# Patient Record
Sex: Male | Born: 1965 | Race: White | Hispanic: No | Marital: Married | Smoking: Former smoker
Health system: Southern US, Community
[De-identification: ages and names within clinical notes are randomized; demographics above are authoritative.]

## PROBLEM LIST (undated history)

## (undated) DIAGNOSIS — E785 Hyperlipidemia, unspecified: Secondary | ICD-10-CM

## (undated) DIAGNOSIS — Z86010 Personal history of colonic polyps: Secondary | ICD-10-CM

## (undated) DIAGNOSIS — I10 Essential (primary) hypertension: Secondary | ICD-10-CM

## (undated) DIAGNOSIS — K56609 Unspecified intestinal obstruction, unspecified as to partial versus complete obstruction: Secondary | ICD-10-CM

## (undated) DIAGNOSIS — K429 Umbilical hernia without obstruction or gangrene: Secondary | ICD-10-CM

## (undated) DIAGNOSIS — T7840XA Allergy, unspecified, initial encounter: Secondary | ICD-10-CM

## (undated) HISTORY — DX: Unspecified intestinal obstruction, unspecified as to partial versus complete obstruction: K56.609

## (undated) HISTORY — DX: Hyperlipidemia, unspecified: E78.5

## (undated) HISTORY — DX: Allergy, unspecified, initial encounter: T78.40XA

## (undated) HISTORY — DX: Essential (primary) hypertension: I10

## (undated) HISTORY — DX: Personal history of colonic polyps: Z86.010

## (undated) HISTORY — PX: APPENDECTOMY: SHX54

---

## 1999-03-09 ENCOUNTER — Emergency Department (HOSPITAL_COMMUNITY): Admission: EM | Admit: 1999-03-09 | Discharge: 1999-03-09 | Payer: Self-pay | Admitting: Emergency Medicine

## 2000-09-13 ENCOUNTER — Ambulatory Visit (HOSPITAL_COMMUNITY): Admission: RE | Admit: 2000-09-13 | Discharge: 2000-09-13 | Payer: Self-pay | Admitting: Orthopaedic Surgery

## 2000-09-13 ENCOUNTER — Encounter: Payer: Self-pay | Admitting: Orthopaedic Surgery

## 2007-01-25 ENCOUNTER — Ambulatory Visit: Payer: Self-pay | Admitting: Family Medicine

## 2007-02-03 ENCOUNTER — Ambulatory Visit: Payer: Self-pay | Admitting: Family Medicine

## 2007-02-03 LAB — CONVERTED CEMR LAB
Cholesterol: 257 mg/dL (ref 0–200)
Direct LDL: 174.9 mg/dL
Glucose, Bld: 143 mg/dL — ABNORMAL HIGH (ref 70–99)
HDL: 29 mg/dL — ABNORMAL LOW (ref >39.0)
TSH: 1.45 microintl units/mL (ref 0.35–5.50)
Total CHOL/HDL Ratio: 8.9
Triglycerides: 214 mg/dL (ref 0–149)
Uric Acid, Serum: 7.9 mg/dL — ABNORMAL HIGH (ref 2.4–7.0)
VLDL: 43 mg/dL — ABNORMAL HIGH (ref 0–40)

## 2007-02-09 ENCOUNTER — Ambulatory Visit: Payer: Self-pay | Admitting: Family Medicine

## 2007-02-13 ENCOUNTER — Telehealth (INDEPENDENT_AMBULATORY_CARE_PROVIDER_SITE_OTHER): Payer: Self-pay | Admitting: *Deleted

## 2007-02-16 ENCOUNTER — Encounter: Admission: RE | Admit: 2007-02-16 | Discharge: 2007-02-16 | Payer: Self-pay | Admitting: Orthopedic Surgery

## 2007-02-21 DIAGNOSIS — J309 Allergic rhinitis, unspecified: Secondary | ICD-10-CM | POA: Insufficient documentation

## 2007-03-03 ENCOUNTER — Ambulatory Visit: Payer: Self-pay | Admitting: Family Medicine

## 2007-03-05 LAB — CONVERTED CEMR LAB
ALT: 38 units/L (ref 0–53)
AST: 23 units/L (ref 0–37)
BUN: 18 mg/dL (ref 6–23)
Calcium: 9.1 mg/dL (ref 8.4–10.5)
Glucose, Bld: 190 mg/dL — ABNORMAL HIGH (ref 70–99)
Sodium: 140 meq/L (ref 135–145)

## 2007-03-06 ENCOUNTER — Telehealth (INDEPENDENT_AMBULATORY_CARE_PROVIDER_SITE_OTHER): Payer: Self-pay | Admitting: *Deleted

## 2007-03-07 ENCOUNTER — Telehealth (INDEPENDENT_AMBULATORY_CARE_PROVIDER_SITE_OTHER): Payer: Self-pay | Admitting: *Deleted

## 2007-03-10 ENCOUNTER — Telehealth (INDEPENDENT_AMBULATORY_CARE_PROVIDER_SITE_OTHER): Payer: Self-pay | Admitting: *Deleted

## 2007-03-24 ENCOUNTER — Encounter (INDEPENDENT_AMBULATORY_CARE_PROVIDER_SITE_OTHER): Payer: Self-pay | Admitting: Family Medicine

## 2007-03-24 ENCOUNTER — Encounter: Admission: RE | Admit: 2007-03-24 | Discharge: 2007-03-24 | Payer: Self-pay | Admitting: Family Medicine

## 2007-03-24 ENCOUNTER — Telehealth (INDEPENDENT_AMBULATORY_CARE_PROVIDER_SITE_OTHER): Payer: Self-pay | Admitting: *Deleted

## 2007-03-27 ENCOUNTER — Telehealth (INDEPENDENT_AMBULATORY_CARE_PROVIDER_SITE_OTHER): Payer: Self-pay | Admitting: *Deleted

## 2007-05-03 ENCOUNTER — Ambulatory Visit: Payer: Self-pay | Admitting: Family Medicine

## 2007-05-03 DIAGNOSIS — E1165 Type 2 diabetes mellitus with hyperglycemia: Secondary | ICD-10-CM

## 2007-05-03 DIAGNOSIS — E119 Type 2 diabetes mellitus without complications: Secondary | ICD-10-CM | POA: Insufficient documentation

## 2007-05-03 DIAGNOSIS — R030 Elevated blood-pressure reading, without diagnosis of hypertension: Secondary | ICD-10-CM

## 2007-05-03 DIAGNOSIS — E782 Mixed hyperlipidemia: Secondary | ICD-10-CM | POA: Insufficient documentation

## 2007-05-03 DIAGNOSIS — L259 Unspecified contact dermatitis, unspecified cause: Secondary | ICD-10-CM | POA: Insufficient documentation

## 2007-05-03 DIAGNOSIS — E1151 Type 2 diabetes mellitus with diabetic peripheral angiopathy without gangrene: Secondary | ICD-10-CM | POA: Insufficient documentation

## 2007-05-03 LAB — CONVERTED CEMR LAB
CO2: 27 meq/L (ref 19–32)
GFR calc Af Amer: 94 mL/min
Glucose, Bld: 87 mg/dL (ref 70–99)
Potassium: 3.8 meq/L (ref 3.5–5.1)

## 2007-05-04 ENCOUNTER — Telehealth (INDEPENDENT_AMBULATORY_CARE_PROVIDER_SITE_OTHER): Payer: Self-pay | Admitting: *Deleted

## 2007-07-27 ENCOUNTER — Ambulatory Visit: Payer: Self-pay | Admitting: Family Medicine

## 2007-07-30 LAB — CONVERTED CEMR LAB
ALT: 61 units/L — ABNORMAL HIGH (ref 0–53)
AST: 41 units/L — ABNORMAL HIGH (ref 0–37)
BUN: 12 mg/dL (ref 6–23)
Calcium: 9.6 mg/dL (ref 8.4–10.5)
Chloride: 104 meq/L (ref 96–112)
Creatinine, Ser: 1 mg/dL (ref 0.4–1.5)
Hgb A1c MFr Bld: 6.9 % — ABNORMAL HIGH (ref 4.6–6.0)
VLDL: 31 mg/dL (ref 0–40)

## 2007-08-03 ENCOUNTER — Ambulatory Visit: Payer: Self-pay | Admitting: Family Medicine

## 2007-08-30 ENCOUNTER — Ambulatory Visit: Payer: Self-pay | Admitting: Family Medicine

## 2007-08-30 ENCOUNTER — Encounter (INDEPENDENT_AMBULATORY_CARE_PROVIDER_SITE_OTHER): Payer: Self-pay | Admitting: *Deleted

## 2007-08-30 LAB — CONVERTED CEMR LAB
BUN: 12 mg/dL (ref 6–23)
Calcium: 9.9 mg/dL (ref 8.4–10.5)
GFR calc Af Amer: 94 mL/min
GFR calc non Af Amer: 78 mL/min
Glucose, Bld: 121 mg/dL — ABNORMAL HIGH (ref 70–99)

## 2007-09-26 ENCOUNTER — Telehealth (INDEPENDENT_AMBULATORY_CARE_PROVIDER_SITE_OTHER): Payer: Self-pay | Admitting: Family Medicine

## 2007-10-24 ENCOUNTER — Ambulatory Visit: Payer: Self-pay | Admitting: Family Medicine

## 2007-10-24 DIAGNOSIS — M549 Dorsalgia, unspecified: Secondary | ICD-10-CM | POA: Insufficient documentation

## 2007-10-24 DIAGNOSIS — R209 Unspecified disturbances of skin sensation: Secondary | ICD-10-CM

## 2007-10-25 ENCOUNTER — Ambulatory Visit: Payer: Self-pay | Admitting: Family Medicine

## 2007-10-26 ENCOUNTER — Telehealth (INDEPENDENT_AMBULATORY_CARE_PROVIDER_SITE_OTHER): Payer: Self-pay | Admitting: Family Medicine

## 2007-10-26 ENCOUNTER — Telehealth (INDEPENDENT_AMBULATORY_CARE_PROVIDER_SITE_OTHER): Payer: Self-pay | Admitting: *Deleted

## 2007-11-03 ENCOUNTER — Ambulatory Visit: Payer: Self-pay | Admitting: Family Medicine

## 2007-11-03 LAB — CONVERTED CEMR LAB
Microalb Creat Ratio: 13.5 mg/g (ref 0.0–30.0)
Microalb, Ur: 1.9 mg/dL (ref 0.0–1.9)

## 2007-11-04 ENCOUNTER — Encounter: Admission: RE | Admit: 2007-11-04 | Discharge: 2007-11-04 | Payer: Self-pay | Admitting: Family Medicine

## 2007-11-08 ENCOUNTER — Telehealth (INDEPENDENT_AMBULATORY_CARE_PROVIDER_SITE_OTHER): Payer: Self-pay | Admitting: *Deleted

## 2007-11-10 ENCOUNTER — Ambulatory Visit: Payer: Self-pay | Admitting: Family Medicine

## 2007-11-13 ENCOUNTER — Telehealth (INDEPENDENT_AMBULATORY_CARE_PROVIDER_SITE_OTHER): Payer: Self-pay | Admitting: *Deleted

## 2007-11-21 ENCOUNTER — Encounter (INDEPENDENT_AMBULATORY_CARE_PROVIDER_SITE_OTHER): Payer: Self-pay | Admitting: Family Medicine

## 2008-01-29 ENCOUNTER — Ambulatory Visit: Payer: Self-pay | Admitting: Family Medicine

## 2008-01-31 LAB — CONVERTED CEMR LAB
ALT: 50 units/L (ref 0–53)
BUN: 17 mg/dL (ref 6–23)
Cholesterol: 141 mg/dL (ref 0–200)
Creatinine, Ser: 1.1 mg/dL (ref 0.4–1.5)
GFR calc Af Amer: 94 mL/min
GFR calc non Af Amer: 77 mL/min
LDL Cholesterol: 82 mg/dL (ref 0–99)
Potassium: 4.3 meq/L (ref 3.5–5.1)
VLDL: 33 mg/dL (ref 0–40)

## 2008-02-01 ENCOUNTER — Encounter (INDEPENDENT_AMBULATORY_CARE_PROVIDER_SITE_OTHER): Payer: Self-pay | Admitting: *Deleted

## 2008-03-25 ENCOUNTER — Ambulatory Visit: Payer: Self-pay | Admitting: Family Medicine

## 2008-03-25 DIAGNOSIS — I10 Essential (primary) hypertension: Secondary | ICD-10-CM | POA: Insufficient documentation

## 2008-05-05 ENCOUNTER — Emergency Department (HOSPITAL_BASED_OUTPATIENT_CLINIC_OR_DEPARTMENT_OTHER): Admission: EM | Admit: 2008-05-05 | Discharge: 2008-05-05 | Payer: Self-pay | Admitting: Emergency Medicine

## 2008-05-09 ENCOUNTER — Ambulatory Visit: Payer: Self-pay | Admitting: Family Medicine

## 2008-05-19 ENCOUNTER — Encounter: Admission: RE | Admit: 2008-05-19 | Discharge: 2008-05-19 | Payer: Self-pay | Admitting: Sports Medicine

## 2008-05-19 LAB — CONVERTED CEMR LAB
Alkaline Phosphatase: 54 units/L (ref 39–117)
Bilirubin, Direct: 0.1 mg/dL (ref 0.0–0.3)
GFR calc Af Amer: 104 mL/min
GFR calc non Af Amer: 86 mL/min
LDL Cholesterol: 88 mg/dL (ref 0–99)
Potassium: 4 meq/L (ref 3.5–5.1)
Sodium: 137 meq/L (ref 135–145)
Total Bilirubin: 0.8 mg/dL (ref 0.3–1.2)
Total CHOL/HDL Ratio: 4.3
VLDL: 21 mg/dL (ref 0–40)

## 2008-05-20 ENCOUNTER — Telehealth: Payer: Self-pay | Admitting: Family Medicine

## 2008-05-23 ENCOUNTER — Encounter: Payer: Self-pay | Admitting: Family Medicine

## 2008-06-07 ENCOUNTER — Encounter: Admission: RE | Admit: 2008-06-07 | Discharge: 2008-06-07 | Payer: Self-pay | Admitting: Sports Medicine

## 2008-06-14 ENCOUNTER — Encounter: Payer: Self-pay | Admitting: Family Medicine

## 2008-06-20 ENCOUNTER — Ambulatory Visit: Payer: Self-pay | Admitting: Family Medicine

## 2008-06-20 DIAGNOSIS — IMO0001 Reserved for inherently not codable concepts without codable children: Secondary | ICD-10-CM

## 2008-06-24 ENCOUNTER — Telehealth (INDEPENDENT_AMBULATORY_CARE_PROVIDER_SITE_OTHER): Payer: Self-pay | Admitting: *Deleted

## 2008-06-24 ENCOUNTER — Ambulatory Visit: Payer: Self-pay | Admitting: Family Medicine

## 2008-06-24 ENCOUNTER — Encounter (INDEPENDENT_AMBULATORY_CARE_PROVIDER_SITE_OTHER): Payer: Self-pay | Admitting: *Deleted

## 2008-06-25 ENCOUNTER — Telehealth: Payer: Self-pay | Admitting: Family Medicine

## 2008-06-25 ENCOUNTER — Emergency Department (HOSPITAL_COMMUNITY): Admission: EM | Admit: 2008-06-25 | Discharge: 2008-06-25 | Payer: Self-pay | Admitting: Emergency Medicine

## 2008-06-25 ENCOUNTER — Telehealth (INDEPENDENT_AMBULATORY_CARE_PROVIDER_SITE_OTHER): Payer: Self-pay | Admitting: *Deleted

## 2008-06-26 ENCOUNTER — Encounter: Payer: Self-pay | Admitting: Family Medicine

## 2008-06-26 ENCOUNTER — Telehealth (INDEPENDENT_AMBULATORY_CARE_PROVIDER_SITE_OTHER): Payer: Self-pay | Admitting: *Deleted

## 2008-07-16 ENCOUNTER — Telehealth (INDEPENDENT_AMBULATORY_CARE_PROVIDER_SITE_OTHER): Payer: Self-pay | Admitting: *Deleted

## 2008-07-22 ENCOUNTER — Encounter: Payer: Self-pay | Admitting: Family Medicine

## 2008-07-24 ENCOUNTER — Encounter: Payer: Self-pay | Admitting: Family Medicine

## 2008-08-12 ENCOUNTER — Ambulatory Visit: Payer: Self-pay | Admitting: Family Medicine

## 2008-08-22 ENCOUNTER — Telehealth (INDEPENDENT_AMBULATORY_CARE_PROVIDER_SITE_OTHER): Payer: Self-pay | Admitting: *Deleted

## 2008-08-22 LAB — CONVERTED CEMR LAB
ALT: 47 units/L (ref 0–53)
Albumin: 4.2 g/dL (ref 3.5–5.2)
BUN: 13 mg/dL (ref 6–23)
Calcium: 9.6 mg/dL (ref 8.4–10.5)
Cholesterol: 236 mg/dL (ref 0–200)
Creatinine, Ser: 0.9 mg/dL (ref 0.4–1.5)
Direct LDL: 177.7 mg/dL
GFR calc non Af Amer: 97 mL/min
Hgb A1c MFr Bld: 6.9 % — ABNORMAL HIGH (ref 4.6–6.0)
Total Bilirubin: 1 mg/dL (ref 0.3–1.2)
Total CHOL/HDL Ratio: 7.6
Triglycerides: 130 mg/dL (ref 0–149)
VLDL: 26 mg/dL (ref 0–40)

## 2009-02-13 ENCOUNTER — Ambulatory Visit: Payer: Self-pay | Admitting: Family Medicine

## 2009-02-13 DIAGNOSIS — R109 Unspecified abdominal pain: Secondary | ICD-10-CM

## 2009-02-13 DIAGNOSIS — M109 Gout, unspecified: Secondary | ICD-10-CM | POA: Insufficient documentation

## 2009-02-19 ENCOUNTER — Ambulatory Visit: Payer: Self-pay | Admitting: Family Medicine

## 2009-02-27 LAB — CONVERTED CEMR LAB
ALT: 45 units/L (ref 0–53)
Albumin: 4.2 g/dL (ref 3.5–5.2)
Alkaline Phosphatase: 47 units/L (ref 39–117)
Chloride: 109 meq/L (ref 96–112)
Cholesterol: 163 mg/dL (ref 0–200)
Creatinine, Ser: 1 mg/dL (ref 0.4–1.5)
GFR calc non Af Amer: 85.8 mL/min (ref >60)
LDL Cholesterol: 109 mg/dL — ABNORMAL HIGH (ref 0–99)
Potassium: 4.1 meq/L (ref 3.5–5.1)
Total Protein: 7 g/dL (ref 6.0–8.3)
Triglycerides: 142 mg/dL (ref 0.0–149.0)

## 2009-02-28 ENCOUNTER — Telehealth (INDEPENDENT_AMBULATORY_CARE_PROVIDER_SITE_OTHER): Payer: Self-pay | Admitting: *Deleted

## 2009-04-17 ENCOUNTER — Telehealth: Payer: Self-pay | Admitting: Family Medicine

## 2009-06-02 ENCOUNTER — Ambulatory Visit: Payer: Self-pay | Admitting: Family Medicine

## 2009-06-04 ENCOUNTER — Encounter: Payer: Self-pay | Admitting: Family Medicine

## 2009-06-08 LAB — CONVERTED CEMR LAB
ALT: 53 units/L (ref 0–53)
AST: 44 units/L — ABNORMAL HIGH (ref 0–37)
Alkaline Phosphatase: 46 units/L (ref 39–117)
Bilirubin, Direct: 0 mg/dL (ref 0.0–0.3)
Chloride: 109 meq/L (ref 96–112)
Cholesterol: 140 mg/dL (ref 0–200)
GFR calc non Af Amer: 85.69 mL/min (ref >60)
LDL Cholesterol: 89 mg/dL (ref 0–99)
Microalb Creat Ratio: 4.6 mg/g (ref 0.0–30.0)
Potassium: 4.3 meq/L (ref 3.5–5.1)
Sodium: 140 meq/L (ref 135–145)
Total Bilirubin: 0.9 mg/dL (ref 0.3–1.2)
Total CHOL/HDL Ratio: 5
Uric Acid, Serum: 6.3 mg/dL (ref 4.0–7.8)
VLDL: 22.6 mg/dL (ref 0.0–40.0)

## 2009-06-16 ENCOUNTER — Telehealth: Payer: Self-pay | Admitting: Family Medicine

## 2009-12-22 ENCOUNTER — Encounter: Payer: Self-pay | Admitting: Family Medicine

## 2010-02-19 ENCOUNTER — Telehealth (INDEPENDENT_AMBULATORY_CARE_PROVIDER_SITE_OTHER): Payer: Self-pay | Admitting: *Deleted

## 2010-03-09 ENCOUNTER — Ambulatory Visit: Payer: Self-pay | Admitting: Family Medicine

## 2010-03-10 ENCOUNTER — Telehealth: Payer: Self-pay | Admitting: Family Medicine

## 2010-03-11 ENCOUNTER — Encounter: Payer: Self-pay | Admitting: Family Medicine

## 2010-10-27 ENCOUNTER — Encounter: Payer: Self-pay | Admitting: Family Medicine

## 2010-11-01 LAB — CONVERTED CEMR LAB
BUN: 12 mg/dL (ref 6–23)
Chloride: 107 meq/L (ref 96–112)
Cholesterol: 239 mg/dL — ABNORMAL HIGH (ref 0–200)
GFR calc non Af Amer: 88.45 mL/min (ref >60)
Hgb A1c MFr Bld: 7.4 % — ABNORMAL HIGH (ref 4.6–6.5)
Potassium: 4.4 meq/L (ref 3.5–5.1)
Total CHOL/HDL Ratio: 7
Triglycerides: 184 mg/dL — ABNORMAL HIGH (ref 0.0–149.0)
VLDL: 36.8 mg/dL (ref 0.0–40.0)

## 2010-11-03 NOTE — Letter (Signed)
Summary: Care Consideration Regarding HbA1C Monitoring/[facility]  Care Consideration Regarding HbA1C Monitoring/[facility]   Imported By: Lanelle Bal 03/24/2010 10:35:31  _____________________________________________________________________  External Attachment:    Type:   Image     Comment:   External Document

## 2010-11-03 NOTE — Progress Notes (Signed)
Summary:  lab 06.06.11  Phone Note Outgoing Call   Summary of Call: Please call pt and schedule labs, Per labs in August was supposed to follow up in 3 months:  272.4  250.00  hgba1c, bmp, hep, NMR-  Follow-up for Phone Call        lm am to call & schedule lab appt .Marland KitchenOkey Regal Kane  Feb 19, 2010 10:39 AM  lm am needs to make lab appt .Marland KitchenOkey Regal Kane  Feb 27, 2010 2:09 PM    Additional Follow-up for Phone Call Additional follow up Details #1::        lab appt scheduled [id number] Additional Follow-up by: Greg Kane,  Mar 03, 2010 2:41 PM

## 2010-11-03 NOTE — Letter (Signed)
Summary: Letter Regarding Adding a Statin/[facility]  Letter Regarding Adding a Statin/[facility]   Imported By: Lanelle Bal 01/02/2010 11:46:32  _____________________________________________________________________  External Attachment:    Type:   Image     Comment:   External Document

## 2010-11-03 NOTE — Progress Notes (Signed)
Summary: Lab Results   Phone Note Outgoing Call   Call placed by: Army Fossa CMA,  March 10, 2010 12:47 PM Reason for Call: Discuss lab or test results Summary of Call: Regarding lab results, LMTCB:  DM not controlled---- con't janumet---add actos 15 mg #30  1 by mouth once daily , 2 refills Cholesterol not controlled---  Ideally your LDL (bad cholesterol) should be <_70___, your HDL (good cholesterol) should be >_40__ and your triglycerides should be< 150.  Diet and exercise will increase HDL and decrease the LDL and triglycerides. Read Dr. Danice Goltz book--Eat Drink and Be Healthy.    Increase Crestor 20 mg  #45  1 1/2 tab by mouth  at bedtime,  2 refills.  We will recheck labs in __3_ months.  hep, lipid, bmp, hgba1c, microalbumin  250.00, 272.4     Signed by Loreen Freud DO on 03/09/2010 at 8:38 PM  Follow-up for Phone Call        Pt states that he has been taking the Crestor 20 mg every other day, please advise. Army Fossa CMA  March 11, 2010 1:59 PM   Additional Follow-up for Phone Call Additional follow up Details #1::        If he can take it once daily it may be all we have to do. Additional Follow-up by: Loreen Freud DO,  March 11, 2010 5:22 PM    Additional Follow-up for Phone Call Additional follow up Details #2::    Pt is aware.  New/Updated Medications: CRESTOR 20 MG TABS (ROSUVASTATIN CALCIUM) Take one tablet daily at bedtime. ACTOS 15 MG TABS (PIOGLITAZONE HCL) 1 by mouth daily. Prescriptions: ACTOS 15 MG TABS (PIOGLITAZONE HCL) 1 by mouth daily.  #30 x 2   Entered by:   Army Fossa CMA   Authorized by:   Loreen Freud DO   Signed by:   Army Fossa CMA on 03/11/2010   Method used:   Electronically to        [facility] [address] (retail)       [address]       [place], [place]  [address]       Ph: [phone] or [phone]       Fax: [phone]   RxID:   [id number]

## 2010-11-06 ENCOUNTER — Telehealth (INDEPENDENT_AMBULATORY_CARE_PROVIDER_SITE_OTHER): Payer: Self-pay | Admitting: *Deleted

## 2010-11-11 NOTE — Progress Notes (Signed)
Summary: Refill Request  Phone Note Refill Request Call back at [phone] Message from:  Pharmacy on November 06, 2010 9:16 AM  Refills Requested: Medication #1:  ACTOS 15 MG TABS 1 by mouth daily..   Dosage confirmed as above?Dosage Confirmed   Supply Requested: 30   Last Refilled: 09/29/2010  Medication #2:  JANUMET 50-1000 MG TABS 1 by mouth two times a day   Dosage confirmed as above?Dosage Confirmed   Supply Requested: 60   Last Refilled: 10/01/2010 [facility] [address].   Next Appointment Scheduled: none Initial call taken by: Harold Barban,  November 06, 2010 9:17 AM    New/Updated Medications: JANUMET 50-1000 MG TABS (SITAGLIPTIN-METFORMIN HCL) 1 by mouth two times a day**LABS ARE DUE NOW_CALL FOR AN APPT** ACTOS 15 MG TABS (PIOGLITAZONE HCL) 1 by mouth daily.* LABS ARE DUE NOW_CALL FOR AN APPT* Prescriptions: ACTOS 15 MG TABS (PIOGLITAZONE HCL) 1 by mouth daily.* LABS ARE DUE NOW_CALL FOR AN APPT*  #30 x 0   Entered by:   Almeta Monas CMA (AAMA)   Authorized by:   Loreen Freud DO   Signed by:   Almeta Monas CMA (AAMA) on 11/06/2010   Method used:   Faxed to ...       [facility] [address] (retail)       [address]       [place], [place]  [address]       Ph: [phone] or [phone]       Fax: [phone]   RxID:   [id number] JANUMET 50-1000 MG TABS (SITAGLIPTIN-METFORMIN HCL) 1 by mouth two times a day**LABS ARE DUE NOW_CALL FOR AN APPT**  #60 x 0   Entered by:   Almeta Monas CMA (AAMA)   Authorized by:   Loreen Freud DO   Signed by:   Almeta Monas CMA (AAMA) on 11/06/2010   Method used:   Electronically to        [facility] [address] (retail)       [address]       [place], [place]  [address]       Ph: [phone] or [phone]       Fax: [phone]   RxID:   [id number]

## 2010-11-11 NOTE — Progress Notes (Signed)
Summary: Refil  Phone Note Refill Request Message from:  Patient on November 06, 2010 11:36 AM  Refills Requested: Medication #1:  ONE TOUCH ULTRA TEST STRIPS checks blood sugar 2x day   Dosage confirmed as above?Dosage Confirmed   Supply Requested: 3 months [facility]   *pt lost last rx*  Next Appointment Scheduled: none Initial call taken by: Lavell Islam,  November 06, 2010 11:36 AM    New/Updated Medications: ONETOUCH ULTRA BLUE  STRP (GLUCOSE BLOOD) take BS twice a day Prescriptions: ONETOUCH ULTRA BLUE  STRP (GLUCOSE BLOOD) take BS twice a day  #100 x 11   Entered by:   Almeta Monas CMA (AAMA)   Authorized by:   Loreen Freud DO   Signed by:   Almeta Monas CMA (AAMA) on 11/06/2010   Method used:   Faxed to ...       [facility] [address] (retail)       [address]       [place], [place]  [address]       Ph: [phone] or [phone]       Fax: [phone]   RxID:   [id number]

## 2010-11-19 NOTE — Letter (Signed)
Summary: Care Consideration/[facility]  Care Consideration/[facility]   Imported By: Maryln Gottron 11/13/2010 12:43:51  _____________________________________________________________________  External Attachment:    Type:   Image     Comment:   External Document

## 2010-12-18 ENCOUNTER — Encounter (INDEPENDENT_AMBULATORY_CARE_PROVIDER_SITE_OTHER): Payer: Self-pay | Admitting: *Deleted

## 2010-12-22 NOTE — Letter (Signed)
Summary: Primary Care Appointment Letter  [facility]  [address] [place], [place] [address]   Phone: [phone]  Fax: [phone]    12/18/2010 MRN: [id number]     [facility] [address] [place], [place]  [address]     Dear Mr. [facility],   Your Primary Care Physician Loreen Freud DO has indicated that:    ___X____it is time to schedule an appointment to have fasting labs done.    _______you missed your appointment on______ and need to call and          reschedule.    _______you need to have lab work done.    _______you need to schedule an appointment discuss lab or test results.    _______you need to call to reschedule your appointment that is                       scheduled on _________.     Please call our office as soon as possible. Our phone number is [phone]. Please press option 1. Our office is open 8a-5p, Monday through Friday.     Thank you,    [facility]

## 2011-01-26 ENCOUNTER — Other Ambulatory Visit: Payer: Self-pay

## 2011-01-26 MED ORDER — PIOGLITAZONE HCL 15 MG PO TABS: 15.0000 mg | ORAL_TABLET | Freq: Every day | ORAL | Status: DC

## 2011-01-26 MED ORDER — SITAGLIPTIN PHOS-METFORMIN HCL 50-1000 MG PO TABS: 1.0000 | ORAL_TABLET | Freq: Two times a day (BID) | ORAL | Status: DC

## 2011-02-11 ENCOUNTER — Telehealth: Payer: Self-pay | Admitting: Family Medicine

## 2011-02-11 NOTE — Telephone Encounter (Signed)
Patient received letter dated 12/18/2010 stating he needed fasting labs--has appt for 5/16---what labs and orders does he need??  (Orders from 03/2010 show hep, lipid, bmp, hgba1c, microalbumin  250.00, 272.4)

## 2011-02-12 NOTE — Telephone Encounter (Signed)
Added info to 5/16 labs

## 2011-02-12 NOTE — Telephone Encounter (Signed)
This is what pt needs to be scheduled for labs form 03/2010

## 2011-02-16 ENCOUNTER — Other Ambulatory Visit: Payer: Self-pay | Admitting: *Deleted

## 2011-02-16 DIAGNOSIS — E119 Type 2 diabetes mellitus without complications: Secondary | ICD-10-CM

## 2011-02-16 DIAGNOSIS — E785 Hyperlipidemia, unspecified: Secondary | ICD-10-CM

## 2011-02-16 NOTE — Assessment & Plan Note (Signed)
[facility]                        [place] OFFICE NOTE   Greg Kane, Greg Kane                      MRN:          [id number]  DATE:03/03/2007                            DOB:          [birth year]    Greg Kane presents for followup. At the last visit, he was started on  Chantix. The patient reports that he has not smoked in 30 days. He has  not refilled the next month of Chantix and states he does not need it.  He feels that he is not going back to smoking ever again.   He is also here to review laboratory results.   OBJECTIVE:  No examination was performed today.   LABORATORY DATA:  Fasting glucose was 143 with a hemoglobin A1c of 8.  Cholesterol was 257, LDL cholesterol was 175, HDL was 29, triglycerides  were 214. Uric acid was 7.9, TSH was 1.45 which was within normal  limits.   IMPRESSION:  1. Type with diabetes newly diagnosed.  2. Hyperlipidemia with LDL of 175 with goal of less than 100.  3. Borderline uric acid level.   PLAN:  1. I had a long discussion regarding type 2 diabetes pathophysiology      and risks.  2. Will refer patient to diabetic nutritionist for teaching. They will      provide the Glucometer and testing supply.  3. Will check a basic metabolic profile, AST and ALT.  4. Will start the patient on Glucophage 500 mg b.i.d. after review of      labs.  5. Will also start the patient on Zocor 40 daily after review of AST      and ALT.  6. Will repeat a uric acid level. If it is still borderline will      discuss treatment at the next visit in 6 weeks.  7. Lifestyle changes were discussed and encouraged.  8. The patient is scheduled to followup with me in 6 weeks.     Leanne Chang, M.D.  Electronically Signed    LA/Med[facility]  DD: 03/03/2007  DT: 03/04/2007  Job #: [id number]

## 2011-02-17 ENCOUNTER — Other Ambulatory Visit: Payer: Self-pay | Admitting: Family Medicine

## 2011-02-17 ENCOUNTER — Other Ambulatory Visit (INDEPENDENT_AMBULATORY_CARE_PROVIDER_SITE_OTHER): Payer: BC Managed Care – PPO

## 2011-02-17 DIAGNOSIS — E119 Type 2 diabetes mellitus without complications: Secondary | ICD-10-CM

## 2011-02-17 DIAGNOSIS — E785 Hyperlipidemia, unspecified: Secondary | ICD-10-CM

## 2011-02-17 DIAGNOSIS — R809 Proteinuria, unspecified: Secondary | ICD-10-CM

## 2011-02-17 LAB — HEPATIC FUNCTION PANEL
ALT: 48 U/L (ref 0–53)
Albumin: 4 g/dL (ref 3.5–5.2)
Alkaline Phosphatase: 48 U/L (ref 39–117)
Bilirubin, Direct: 0.1 mg/dL (ref 0.0–0.3)
Total Protein: 6.5 g/dL (ref 6.0–8.3)

## 2011-02-17 LAB — BASIC METABOLIC PANEL
CO2: 27 mEq/L (ref 19–32)
Chloride: 105 mEq/L (ref 96–112)
Glucose, Bld: 165 mg/dL — ABNORMAL HIGH (ref 70–99)
Sodium: 139 mEq/L (ref 135–145)

## 2011-02-17 LAB — LIPID PANEL
HDL: 30.6 mg/dL — ABNORMAL LOW (ref >39.00)
Total CHOL/HDL Ratio: 4
Triglycerides: 92 mg/dL (ref 0.0–149.0)

## 2011-02-17 LAB — MICROALBUMIN / CREATININE URINE RATIO
Creatinine,U: 219.6 mg/dL
Microalb, Ur: 3.5 mg/dL — ABNORMAL HIGH (ref 0.0–1.9)

## 2011-02-18 ENCOUNTER — Other Ambulatory Visit: Payer: Self-pay | Admitting: Family Medicine

## 2011-02-19 ENCOUNTER — Telehealth: Payer: Self-pay | Admitting: *Deleted

## 2011-02-19 DIAGNOSIS — R809 Proteinuria, unspecified: Secondary | ICD-10-CM

## 2011-02-19 NOTE — Telephone Encounter (Addendum)
Discuss with patient, awaiting appt info  Message copied by Candie Echevaria on Fri Feb 19, 2011 12:13 PM ------      Message from: Loreen Freud      Created: Wed Feb 17, 2011  2:16 PM       Increased proteinuria---refer to nephrology

## 2011-03-16 ENCOUNTER — Encounter: Payer: Self-pay | Admitting: Family Medicine

## 2011-03-18 ENCOUNTER — Ambulatory Visit (INDEPENDENT_AMBULATORY_CARE_PROVIDER_SITE_OTHER): Payer: BC Managed Care – PPO | Admitting: Family Medicine

## 2011-03-18 ENCOUNTER — Encounter: Payer: Self-pay | Admitting: Family Medicine

## 2011-03-18 VITALS — BP 130/90 | HR 100 | Temp 98.4°F | Wt 295.0 lb

## 2011-03-18 DIAGNOSIS — E785 Hyperlipidemia, unspecified: Secondary | ICD-10-CM

## 2011-03-18 DIAGNOSIS — I10 Essential (primary) hypertension: Secondary | ICD-10-CM

## 2011-03-18 DIAGNOSIS — E119 Type 2 diabetes mellitus without complications: Secondary | ICD-10-CM

## 2011-03-18 MED ORDER — LOSARTAN POTASSIUM 50 MG PO TABS: 50.0000 mg | ORAL_TABLET | Freq: Every day | ORAL | Status: DC

## 2011-03-18 MED ORDER — LIRAGLUTIDE 18 MG/3ML ~~LOC~~ SOLN: SUBCUTANEOUS | Status: DC

## 2011-03-18 MED ORDER — METFORMIN HCL ER 500 MG PO TB24: ORAL_TABLET | ORAL | Status: DC

## 2011-03-18 NOTE — Progress Notes (Signed)
  Subjective:    Patient ID: Greg Kane, male    DOB: 05-02-1966, 45 y.o.   MRN: [id number]  HPI Pt here to review labs and go over Victoza.   No other complaints.  Pt stopped lisinopril because it made him feel funny.     Review of Systems As above    Objective:   Physical Exam  Constitutional: He appears well-developed and well-nourished.  Psychiatric: He has a normal mood and affect. His behavior is normal.          Assessment & Plan:

## 2011-03-18 NOTE — Patient Instructions (Signed)
Diabetes and Exercise Regular exercise is important and can help:   Control blood glucose (sugar).   Decrease blood pressure.   Control blood lipids (cholesterol and triglycerides).   Improve overall health.  BENEFITS FROM EXERCISE: Improved fitness.       Diabetes Meal Planning Guide The diabetes meal planning guide is a tool to help you plan your meals and snacks. It is important for people with diabetes to manage their blood sugar levels. Choosing the right foods and the right amounts throughout your day will help control your blood sugar. Eating right can even help you improve your blood pressure and reach or maintain a healthy weight. CARBOHYDRATE COUNTING MADE EASY When you eat carbohydrates, they turn to sugar (glucose). This raises your blood sugar level. Counting carbohydrates can help you control this level so you feel better. When you plan your meals by counting carbohydrates, you can have more flexibility in what you eat and balance your medicine with your food intake. Carbohydrate counting simply means adding up the total amount of carbohydrate grams (g) in your meals or snacks. Try to eat about the same amount at each meal. Foods with carbohydrates are listed below. Each portion below is 1 carbohydrate serving or 15 grams of carbohydrates. Ask your dietician how many grams of carbohydrates you should eat at each meal or snack. Grains and Starches 1 slice bread 1/2 English muffin or hotdog/hamburger bun 3/4 cup cold cereal (unsweetened) 1/3 cup cooked pasta or rice 1/2 cup starchy vegetables (corn, potatoes, peas, beans, winter squash) 1 tortilla (6 inches) 1/4 bagel 1 waffle or pancake (size of a CD) 1/2 cup cooked cereal 4 to 6 small crackers *Whole grain is recommended Fruit 1 cup fresh unsweetened berries, melon, papaya, pineapple 1 small fresh fruit 1/2 banana or mango 1/2 cup fruit juice (4 ounces unsweetened) 1/2 cup canned fruit in natural juice or water 2  tablespoons dried fruit 12 to 15 grapes or cherries Milk and Yogurt 1 cup fat-free or 1% milk 1 cup soy milk 6 ounces light yogurt with sugar-free sweetener 6 ounces low-fat soy yogurt 6 ounces plain yogurt Vegetables 1 cup raw or 1/2 cup cooked is counted as 0 carbohydrates or a "free" food. If you eat 3 or more servings at one meal, count them as 1 carbohydrate serving. Other Carbohydrates 3/4 ounces chips or pretzels 1/2 cup ice cream or frozen yogurt 1/4 cup sherbet or sorbet 2 inch square cake, no frosting 1 tablespoon honey, sugar, jam, jelly, or syrup 2 small cookies 3 squares of graham crackers 3 cups popcorn 6 crackers 1 cup broth-based soup Count 1 cup casserole or other mixed foods as 2 carbohydrate servings. Foods with less than 20 calories in a serving may be counted as 0 carbohydrates or a "free" food. You may want to purchase a book or computer software that lists the carbohydrate gram counts of different foods. In addition, the nutrition facts panel on the labels of the foods you eat are a good source of this information. The label will tell you how big the serving size is and the total number of carbohydrate grams you will be eating per serving. Divide this number by 15 to obtain the number of carbohydrate servings in a portion. Remember: 1 carbohydrate serving equals 15 grams of carbohydrate. SERVING SIZES Measuring foods and serving sizes helps you make sure you are getting the right amount of food. The list below tells how big or small some common serving sizes are. 1 ounce (  oz) of cheese.................................4 stacked dice.  2 to 3 oz cooked meat.................................Marland KitchenDeck of cards.  1 teaspoon (tsp)...........................................Marland KitchenTip of little finger.  1 tablespoon (tbs).......................................Marland KitchenMarland KitchenThumb.  2 tbs............................................................Marland KitchenGolf ball.    cup..........................................................Marland KitchenHalf of a fist.  1 cup...........................................................Marland KitchenA fist.  SAMPLE DIABETES MEAL PLAN Below is a sample meal plan that includes foods from the grain and starches, dairy, vegetable, fruit, and meat groups. A dietician can individualize a meal plan to fit your calorie needs and tell you the number of servings needed from each food group. However, controlling the total amount of carbohydrates in your meal or snack is more important than making sure you include all of the food groups at every meal. You may interchange carbohydrate containing foods (dairy, starches, and fruits). The meal plan below is an example of a 2000 calorie diet using carbohydrate counting. This meal plan has 17 carbohydrate servings (carb choices). Breakfast 1 cup oatmeal (2 carb choices) 3/4 cup light yogurt (1 carb choice) 1 cup blueberries (1 carb choice) 1/4 cup almonds  Snack 1 large apple (2 carb choices) 1 low-fat string cheese stick  Lunch Chicken breast salad: 1 cup spinach  1/4 cup chopped tomatoes  2 oz chicken breast, sliced  2 tbs low-fat [place] dressing  12 whole-wheat crackers (2 carb choices) 12 to 15 grapes (1 carb choice) 1 cup low-fat milk (1 carb choice)  Snack 1 cup carrots 1/2 cup hummus (1 carb choice)  Dinner 3 oz broiled salmon 1 cup brown rice (3 carb choices)  Snack 1 1/2 cups steamed broccoli (1 carb choice) drizzled with 1 tsp olive oil and lemon juice 1 cup light pudding (2 carb choices)  DIABETES MEAL PLANNING WORKSHEET Your dietician can use this worksheet to help you decide how many servings of foods and what types of foods are right for you.  Breakfast Food Group and Servings Carb Choices Grain/Starches _______________________________________ Dairy ______________________________________________ Vegetable _______________________________________ Fruit  _______________________________________________ Meat _______________________________________________ Fat _____________________________________________ Lunch Food Group and Servings Carb Choices Grain/Starches ________________________________________ Dairy _______________________________________________ Fruit ________________________________________________ Meat ________________________________________________ Fat _____________________________________________ Dinner Food Group and Servings Carb Choices Grain/Starches ________________________________________ Dairy _______________________________________________ Fruit ________________________________________________ Meat ________________________________________________ Fat _____________________________________________ Snacks Food Group and Servings Carb Choices Grain/Starches ________________________________________ Dairy _______________________________________________ Vegetable ________________________________________ Fruit ________________________________________________ Meat ________________________________________________ Fat _____________________________________________ Daily Totals Starches _________________________ Vegetable __________________________ Fruit ______________________________ Dairy ______________________________ Meat ______________________________ Fat ________________________________  Document Released: 06/17/2005 Document Re-Released: 03/10/2010  [employer] Patient Information 2011 [place], [place].  Improved flexibility.   Improved endurance.   Increased bone density.   Weight control.   Increased muscle strength.   Decreased body fat.   Improvement of the body's use of a hormone called insulin.   Increased insulin sensitivity.   Reduction of insulin needs.   Helps you feel better.   Reduces stress and tension.  People with diabetes who add exercise to their lifestyle gain additional benefits.     Weight loss.   Reduces appetite.   Improves body's use of blood glucose (sugar).   Decreases risk factors for heart disease:   Lowering of cholesterol and triglycerides.   Raising the level of good cholesterol (high-density lipoproteins [HDL]).   Lowering blood sugar.   Decreases blood pressure.  TYPE 1 DIABETES AND EXERCISE  Exercise will usually lower your blood glucose.   If blood glucose is greater than 240 mg/dl, check urine ketones. If ketones are present, do not exercise.   Location of the insulin injection sites may need to be adjusted with exercise. Avoid injecting insulin into areas of the body  that will be exercised. For example, avoid injecting insulin into:   The arms when playing tennis.   The legs when jogging. For more information, discuss this with your caregiver.   Keep a record of:   Food intake.   Type and amount of exercise.   Expected peak times of insulin action.   Blood glucose (sugar) levels.  Do this before, during and after exercise. Review your records with your caregiver(s). This will help you to develop guidelines for adjusting food intake and/or insulin amounts.  TYPE 2 DIABETES AND EXERCISE  Regular physical activity can help control blood glucose.   Exercise is important because it may:   Increase the body's sensitivity to insulin.   Improve blood glucose control.   Exercise reduces the risk of heart disease. It decreases serum cholesterol and triglycerides. It also lowers blood pressure.   Those who take insulin or oral hypoglycemic agents should watch for signs of hypoglycemia. These signs include dizziness, shaking, sweating, chills and confusion.   Body water is lost during exercise. It must be replaced. This will help to avoid loss of body fluids (dehydration) and/or heat stroke.  Be sure to talk to your caregiver before starting an exercise program to make sure it is safe for you. Remember, any activity is better than  none.  Document Released: 12/11/2003 Document Re-Released: 07/18/2009 [facility] Patient Information 2011 [place], [place].

## 2011-03-18 NOTE — Assessment & Plan Note (Signed)
See meds and orders Check glucose bid and rto 2-3 weeks or sooner prn---bring in readings

## 2011-03-18 NOTE — Assessment & Plan Note (Signed)
coazaar 50 mg daily rto 2-3 weeks

## 2011-03-18 NOTE — Assessment & Plan Note (Signed)
con't crestor Add flaxseed oil 4 g daily

## 2011-04-12 ENCOUNTER — Encounter: Payer: Self-pay | Admitting: Family Medicine

## 2011-04-12 ENCOUNTER — Ambulatory Visit (INDEPENDENT_AMBULATORY_CARE_PROVIDER_SITE_OTHER): Payer: BC Managed Care – PPO | Admitting: Family Medicine

## 2011-04-12 VITALS — BP 122/90 | HR 114 | Temp 97.0°F | Wt 295.0 lb

## 2011-04-12 DIAGNOSIS — I10 Essential (primary) hypertension: Secondary | ICD-10-CM

## 2011-04-12 DIAGNOSIS — E785 Hyperlipidemia, unspecified: Secondary | ICD-10-CM

## 2011-04-12 DIAGNOSIS — E119 Type 2 diabetes mellitus without complications: Secondary | ICD-10-CM

## 2011-04-12 DIAGNOSIS — M109 Gout, unspecified: Secondary | ICD-10-CM

## 2011-04-12 MED ORDER — ROSUVASTATIN CALCIUM 20 MG PO TABS: 20.0000 mg | ORAL_TABLET | Freq: Every day | ORAL | Status: DC

## 2011-04-12 MED ORDER — LIRAGLUTIDE 18 MG/3ML ~~LOC~~ SOLN: SUBCUTANEOUS | Status: DC

## 2011-04-12 MED ORDER — INSULIN PEN NEEDLE 32G X 5 MM MISC: 1.0000 "application " | Freq: Every day | Status: DC

## 2011-04-12 MED ORDER — LOSARTAN POTASSIUM 50 MG PO TABS: 50.0000 mg | ORAL_TABLET | Freq: Every day | ORAL | Status: DC

## 2011-04-12 MED ORDER — ALLOPURINOL 100 MG PO TABS: 100.0000 mg | ORAL_TABLET | Freq: Two times a day (BID) | ORAL | Status: DC

## 2011-04-12 MED ORDER — METFORMIN HCL ER 500 MG PO TB24: ORAL_TABLET | ORAL | Status: DC

## 2011-04-12 MED ORDER — GLUCOSE BLOOD VI STRP: ORAL_STRIP | Status: DC

## 2011-04-12 NOTE — Patient Instructions (Signed)
Diabetes Meal Planning Guide The diabetes meal planning guide is a tool to help you plan your meals and snacks. It is important for people with diabetes to manage their blood sugar levels. Choosing the right foods and the right amounts throughout your day will help control your blood sugar. Eating right can even help you improve your blood pressure and reach or maintain a healthy weight. CARBOHYDRATE COUNTING MADE EASY When you eat carbohydrates, they turn to sugar (glucose). This raises your blood sugar level. Counting carbohydrates can help you control this level so you feel better. When you plan your meals by counting carbohydrates, you can have more flexibility in what you eat and balance your medicine with your food intake. Carbohydrate counting simply means adding up the total amount of carbohydrate grams (g) in your meals or snacks. Try to eat about the same amount at each meal. Foods with carbohydrates are listed below. Each portion below is 1 carbohydrate serving or 15 grams of carbohydrates. Ask your dietician how many grams of carbohydrates you should eat at each meal or snack. Grains and Starches 1 slice bread 1/2 English muffin or hotdog/hamburger bun 3/4 cup cold cereal (unsweetened) 1/3 cup cooked pasta or rice 1/2 cup starchy vegetables (corn, potatoes, peas, beans, winter squash) 1 tortilla (6 inches) 1/4 bagel 1 waffle or pancake (size of a CD) 1/2 cup cooked cereal 4 to 6 small crackers *Whole grain is recommended Fruit 1 cup fresh unsweetened berries, melon, papaya, pineapple 1 small fresh fruit 1/2 banana or mango 1/2 cup fruit juice (4 ounces unsweetened) 1/2 cup canned fruit in natural juice or water 2 tablespoons dried fruit 12 to 15 grapes or cherries Milk and Yogurt 1 cup fat-free or 1% milk 1 cup soy milk 6 ounces light yogurt with sugar-free sweetener 6 ounces low-fat soy yogurt 6 ounces plain yogurt Vegetables 1 cup raw or 1/2 cup cooked is counted as 0  carbohydrates or a "free" food. If you eat 3 or more servings at one meal, count them as 1 carbohydrate serving. Other Carbohydrates 3/4 ounces chips or pretzels 1/2 cup ice cream or frozen yogurt 1/4 cup sherbet or sorbet 2 inch square cake, no frosting 1 tablespoon honey, sugar, jam, jelly, or syrup 2 small cookies 3 squares of graham crackers 3 cups popcorn 6 crackers 1 cup broth-based soup Count 1 cup casserole or other mixed foods as 2 carbohydrate servings. Foods with less than 20 calories in a serving may be counted as 0 carbohydrates or a "free" food. You may want to purchase a book or computer software that lists the carbohydrate gram counts of different foods. In addition, the nutrition facts panel on the labels of the foods you eat are a good source of this information. The label will tell you how big the serving size is and the total number of carbohydrate grams you will be eating per serving. Divide this number by 15 to obtain the number of carbohydrate servings in a portion. Remember: 1 carbohydrate serving equals 15 grams of carbohydrate. SERVING SIZES Measuring foods and serving sizes helps you make sure you are getting the right amount of food. The list below tells how big or small some common serving sizes are.  1 ounce (oz) of cheese.................................4 stacked dice.   2 to 3 oz cooked meat..................................Deck of cards.   1 teaspoon (tsp)............................................Tip of little finger.   1 tablespoon (tbs).........................................Thumb.   2 tbs.............................................................Golf ball.    cup...........................................................Half of a fist.   1 cup............................................................A fist.    SAMPLE DIABETES MEAL PLAN Below is a sample meal plan that includes foods from the grain and starches, dairy, vegetable, fruit, and  meat groups. A dietician can individualize a meal plan to fit your calorie needs and tell you the number of servings needed from each food group. However, controlling the total amount of carbohydrates in your meal or snack is more important than making sure you include all of the food groups at every meal. You may interchange carbohydrate containing foods (dairy, starches, and fruits). The meal plan below is an example of a 2000 calorie diet using carbohydrate counting. This meal plan has 17 carbohydrate servings (carb choices). Breakfast 1 cup oatmeal (2 carb choices) 3/4 cup light yogurt (1 carb choice) 1 cup blueberries (1 carb choice) 1/4 cup almonds  Snack 1 large apple (2 carb choices) 1 low-fat string cheese stick  Lunch Chicken breast salad:  1 cup spinach   1/4 cup chopped tomatoes   2 oz chicken breast, sliced   2 tbs low-fat Italian dressing  12 whole-wheat crackers (2 carb choices) 12 to 15 grapes (1 carb choice) 1 cup low-fat milk (1 carb choice)  Snack 1 cup carrots 1/2 cup hummus (1 carb choice)  Dinner 3 oz broiled salmon 1 cup brown rice (3 carb choices)  Snack 1 1/2 cups steamed broccoli (1 carb choice) drizzled with 1 tsp olive oil and lemon juice 1 cup light pudding (2 carb choices)  DIABETES MEAL PLANNING WORKSHEET Your dietician can use this worksheet to help you decide how many servings of foods and what types of foods are right for you.  Breakfast Food Group and Servings Carb Choices Grain/Starches _______________________________________ Dairy ______________________________________________ Vegetable _______________________________________ Fruit _______________________________________________ Meat _______________________________________________ Fat _____________________________________________ Lunch Food Group and Servings Carb Choices Grain/Starches ________________________________________ Dairy _______________________________________________ Fruit  ________________________________________________ Meat ________________________________________________ Fat _____________________________________________ Dinner Food Group and Servings Carb Choices Grain/Starches ________________________________________ Dairy _______________________________________________ Fruit ________________________________________________ Meat ________________________________________________ Fat _____________________________________________ Snacks Food Group and Servings Carb Choices Grain/Starches ________________________________________ Dairy _______________________________________________ Vegetable ________________________________________ Fruit ________________________________________________ Meat ________________________________________________ Fat _____________________________________________ Daily Totals Starches _________________________ Vegetable __________________________ Fruit ______________________________ Dairy ______________________________ Meat ______________________________ Fat ________________________________  Document Released: 06/17/2005 Document Re-Released: 03/10/2010 [employer] Patient Information 2011 [employer]. 

## 2011-04-12 NOTE — Progress Notes (Signed)
Addended by: Arnette Norris on: 04/12/2011 01:11 PM   Modules accepted: Orders

## 2011-04-12 NOTE — Assessment & Plan Note (Signed)
Pt ran out of victoza on the 2nd con't meds Recheck labs 4-6 weeks

## 2011-04-12 NOTE — Assessment & Plan Note (Signed)
Better--diastolic slightly high con't diet and exercise  Recheck 3 months

## 2011-04-12 NOTE — Assessment & Plan Note (Signed)
Cont' crestor Check labs 4-6 weeks

## 2011-04-12 NOTE — Progress Notes (Signed)
  Subjective:    Patient ID: REVERE MAAHS, male    DOB: 1966/01/29, 45 y.o.   MRN: [id number]  HPI  HYPERTENSION Disease Monitoring Blood pressure range-normal Chest pain- no      Dyspnea- no Medications Compliance- good Lightheadedness- no   Edema- no   DIABETES Disease Monitoring Blood Sugar ranges-140-150 Polyuria- no New Visual problems- no Medications Compliance- good Hypoglycemic symptoms- no   HYPERLIPIDEMIA Disease Monitoring See symptoms for Hypertension Medications Compliance- good RUQ pain- no  Muscle aches- no  ROS See HPI above   PMH Smoking Status noted   Review of Systems    see above Objective:   Physical Exam  Constitutional: He is oriented to person, place, and time. He appears well-developed and well-nourished.  Cardiovascular: Normal rate, regular rhythm and normal heart sounds.   No murmur heard. Pulmonary/Chest: Effort normal and breath sounds normal. No respiratory distress. He has no wheezes. He has no rales. He exhibits no tenderness.  Musculoskeletal: Normal range of motion. He exhibits no edema and no tenderness.       Sensory exam of the foot is normal, tested with the monofilament. Good pulses, no lesions or ulcers, good peripheral pulses.   Neurological: He is alert and oriented to person, place, and time. No cranial nerve deficit.  Skin: Skin is warm and dry.  Psychiatric: He has a normal mood and affect. His behavior is normal. Judgment and thought content normal.          Assessment & Plan:

## 2011-06-14 ENCOUNTER — Encounter: Payer: Self-pay | Admitting: Family Medicine

## 2011-06-14 ENCOUNTER — Ambulatory Visit (INDEPENDENT_AMBULATORY_CARE_PROVIDER_SITE_OTHER): Payer: BC Managed Care – PPO | Admitting: Family Medicine

## 2011-06-14 DIAGNOSIS — E785 Hyperlipidemia, unspecified: Secondary | ICD-10-CM

## 2011-06-14 DIAGNOSIS — E119 Type 2 diabetes mellitus without complications: Secondary | ICD-10-CM

## 2011-06-14 DIAGNOSIS — M109 Gout, unspecified: Secondary | ICD-10-CM

## 2011-06-14 DIAGNOSIS — R809 Proteinuria, unspecified: Secondary | ICD-10-CM

## 2011-06-14 DIAGNOSIS — I10 Essential (primary) hypertension: Secondary | ICD-10-CM

## 2011-06-14 LAB — BASIC METABOLIC PANEL
BUN: 15 mg/dL (ref 6–23)
Creatinine, Ser: 1.1 mg/dL (ref 0.4–1.5)
GFR: 77.71 mL/min (ref >60.00)

## 2011-06-14 LAB — CBC WITH DIFFERENTIAL/PLATELET
Basophils Relative: 0.4 % (ref 0.0–3.0)
Eosinophils Relative: 2 % (ref 0.0–5.0)
Lymphocytes Relative: 26.9 % (ref 12.0–46.0)
Monocytes Relative: 6.6 % (ref 3.0–12.0)
Neutrophils Relative %: 64.1 % (ref 43.0–77.0)
Platelets: 202 10*3/uL (ref 150.0–400.0)
RBC: 5.23 Mil/uL (ref 4.22–5.81)
WBC: 8.9 10*3/uL (ref 4.5–10.5)

## 2011-06-14 LAB — LIPID PANEL
Cholesterol: 176 mg/dL (ref 0–200)
LDL Cholesterol: 106 mg/dL — ABNORMAL HIGH (ref 0–99)
Total CHOL/HDL Ratio: 5
VLDL: 34.6 mg/dL (ref 0.0–40.0)

## 2011-06-14 LAB — HEPATIC FUNCTION PANEL
AST: 30 U/L (ref 0–37)
Alkaline Phosphatase: 57 U/L (ref 39–117)
Bilirubin, Direct: 0.1 mg/dL (ref 0.0–0.3)
Total Bilirubin: 0.6 mg/dL (ref 0.3–1.2)

## 2011-06-14 LAB — HEMOGLOBIN A1C: Hgb A1c MFr Bld: 7.4 % — ABNORMAL HIGH (ref 4.6–6.5)

## 2011-06-14 LAB — MICROALBUMIN / CREATININE URINE RATIO
Creatinine,U: 86.2 mg/dL
Microalb, Ur: 0.3 mg/dL (ref 0.0–1.9)

## 2011-06-14 LAB — URIC ACID: Uric Acid, Serum: 5 mg/dL (ref 4.0–7.8)

## 2011-06-14 MED ORDER — ROSUVASTATIN CALCIUM 20 MG PO TABS: 20.0000 mg | ORAL_TABLET | Freq: Every day | ORAL | Status: DC

## 2011-06-14 NOTE — Progress Notes (Signed)
  Subjective:    Patient ID: CLEOPHAS YOAK, male    DOB: 1966-02-06, 45 y.o.   MRN: [id number]  HPIHYPERTENSION Disease Monitoring Blood pressure range-not checking at home Chest pain- no      Dyspnea- no Medications Compliance- good Lightheadedness- no   Edema- no   DIABETES Disease Monitoring Blood Sugar ranges-120-134  Polyuria- no New Visual problems- no Medications Compliance- good Hypoglycemic symptoms- no   HYPERLIPIDEMIA Disease Monitoring See symptoms for Hypertension Medications Compliance- good RUQ pain- no  Muscle aches- no  ROS See HPI above   PMH Smoking Status noted      Review of Systems As above    Objective:   Physical Exam  Constitutional: He is oriented to person, place, and time. He appears well-developed and well-nourished.  Cardiovascular: Normal rate, regular rhythm and normal heart sounds.   No murmur heard. Pulmonary/Chest: Effort normal and breath sounds normal. No respiratory distress. He has no wheezes. He has no rales.  Musculoskeletal: He exhibits no edema and no tenderness.  Neurological: He is alert and oriented to person, place, and time.  Psychiatric: He has a normal mood and affect. His behavior is normal. Judgment and thought content normal.          Assessment & Plan:

## 2011-06-14 NOTE — Assessment & Plan Note (Signed)
Improved con't meds

## 2011-06-14 NOTE — Patient Instructions (Signed)
Diabetes and Your Kidney The function of normal kidneys is to filter and clean blood. Kidneys also rid the body's waste products and extra fluid. When the kidneys are healthy, blood is brought into the kidney and filtered, keeping proteins and needed chemicals in the blood stream. Waste and extra fluid is removed, and the cleaned blood is returned to the blood stream.  HOW DOES KIDNEY DISEASE (NEPHROPATHY) DEVELOP Kidney disease (nephropathy) develops slowly and may be caused by years of high blood glucose (sugar) and high blood pressure that damage the kidney's filters. When the kidneys are damaged, protein leaks into the urine. Damaged kidneys are not able to clean out waste and extra fluid. Waste and fluid build up in your blood instead of leaving the body in the urine. As damage progresses, more and more protein leaks from the kidneys and waste products build up in the blood. The damage progresses until the kidneys fail.  CAUSES  High blood pressure.   High blood glucose (sugar).   Family history.   Aging.   Obstruction problems.   Some drugs or medications.  SYMPTOMS Symptoms will not be seen or felt for many years. You will not feel sick when your kidneys do only half the job of healthy kidneys. You might not notice any signs of kidney failure until your kidneys have lost much of their ability to function. An early sign of damage is when small amounts of protein (albumin) leak into the urine. However, the only way to know about the leakage is to have your urine tested, but without physical symptoms, a urine test is often not performed.  SCREENING  Annual urine tests should be done to screen for trace amounts of protein in the urine (microalbuminuria).   A periodic, 24 hour collection of urine to measure creatinine and protein should be done as an estimate of kidney function.   Having a blood test called, serum creatinine, performed once a year to estimate how your kidneys are  filtering.  Some of these tests would include:  An ultrasound of your kidney.   Obtaining a small piece of the kidney to look at under a microscope (kidney biopsy).  PREVENTION Controlling high blood pressure (hypertension) and high blood glucose (hyperglycemia) is critical. A blood pressure goal to maintain is below 130/80. Get early treatment for urinary tract infections and follow up regularly with your caregiver. If your disease progresses to end stage kidney failure, you will need dialysis or a transplant. Dialysis can be done in two ways:   Hemodialysis - your blood flows from a tube in your arm through a machine. The machine filters waste and extra fluid. The cleaned blood flows back into your arm.   Peritoneal dialysis - your belly is filled with a special fluid. The fluid collects waste products and extra fluid from your blood. The fluid is then drained from your belly and discarded.  SEEK MEDICAL CARE IF:  You are having problems keeping your blood glucose in the goal range.   You have swelling of hands or feet.   You develop weakness.   You have muscles spasms.  Document Released: 10/10/2007 Document Re-Released: 07/17/2009 [employer] Patient Information 2011 [employer]. 

## 2011-06-14 NOTE — Assessment & Plan Note (Signed)
Pt saw nephro Recheck today

## 2011-06-14 NOTE — Assessment & Plan Note (Signed)
Check uric acid Pt is not taking allopurinal No gout symptoms

## 2011-06-14 NOTE — Assessment & Plan Note (Signed)
Check labs con't meds 

## 2011-06-14 NOTE — Assessment & Plan Note (Signed)
Con't meds Check labs stable

## 2011-07-05 ENCOUNTER — Telehealth: Payer: Self-pay

## 2011-07-05 MED ORDER — METFORMIN HCL ER 500 MG PO TB24: 1500.0000 mg | ORAL_TABLET | Freq: Every day | ORAL | Status: DC

## 2011-07-05 NOTE — Telephone Encounter (Signed)
Message copied by Arnette Norris on Mon Jul 05, 2011  5:50 PM ------      Message from: Lelon Perla      Created: Sun Jun 20, 2011  7:01 PM       Increase glucophage xr 750 mg  #60  2 po qd ,  2 refills      Cholesterol--- LDL goal < 70,  HDL >40,  TG < 150.  Diet and exercise will increase HDL and decrease LDL and TG.  Fish,  Fish Oil, Flaxseed oil will also help increase the HDL and decrease Triglycerides.  con't crestor.  Add antara 130 mg  #30  1 po qd, 2 refills--- give coupon.    Recheck labs in 3 months.      272.4  250.00  Lipid, hep, bmp, hgba1c

## 2011-07-05 NOTE — Telephone Encounter (Signed)
Dicussed labs with patient and he stated that he just got a 3 mo supply of the Metformin and wanted to know if he could take 3 tabs daily of the 500 XR and per Dr.Lowne that was ok. He also declined the Antara, he said they discussed him being off of meds for over a month, and he does not want anymore pills, stated he will continue the crestor and recheck labs in 3 mo. Per Dr.Lowne that was ok as well. Discussed with patient and he agreed and voiced understanding      KP

## 2011-10-05 DIAGNOSIS — K56609 Unspecified intestinal obstruction, unspecified as to partial versus complete obstruction: Secondary | ICD-10-CM

## 2011-10-05 HISTORY — DX: Unspecified intestinal obstruction, unspecified as to partial versus complete obstruction: K56.609

## 2011-10-12 ENCOUNTER — Encounter (HOSPITAL_BASED_OUTPATIENT_CLINIC_OR_DEPARTMENT_OTHER): Payer: Self-pay | Admitting: *Deleted

## 2011-10-12 ENCOUNTER — Inpatient Hospital Stay (HOSPITAL_BASED_OUTPATIENT_CLINIC_OR_DEPARTMENT_OTHER)
Admission: EM | Admit: 2011-10-12 | Discharge: 2011-10-14 | DRG: 180 | Disposition: A | Discharge status: Home / Self Care | Payer: BC Managed Care – PPO | Attending: Internal Medicine | Admitting: Internal Medicine

## 2011-10-12 DIAGNOSIS — Z880 Allergy status to penicillin: Secondary | ICD-10-CM

## 2011-10-12 DIAGNOSIS — E785 Hyperlipidemia, unspecified: Secondary | ICD-10-CM | POA: Diagnosis present

## 2011-10-12 DIAGNOSIS — E1151 Type 2 diabetes mellitus with diabetic peripheral angiopathy without gangrene: Secondary | ICD-10-CM | POA: Diagnosis present

## 2011-10-12 DIAGNOSIS — K565 Intestinal adhesions [bands], unspecified as to partial versus complete obstruction: Principal | ICD-10-CM | POA: Diagnosis present

## 2011-10-12 DIAGNOSIS — I10 Essential (primary) hypertension: Secondary | ICD-10-CM | POA: Diagnosis present

## 2011-10-12 DIAGNOSIS — E782 Mixed hyperlipidemia: Secondary | ICD-10-CM | POA: Diagnosis present

## 2011-10-12 DIAGNOSIS — E669 Obesity, unspecified: Secondary | ICD-10-CM | POA: Diagnosis present

## 2011-10-12 DIAGNOSIS — M109 Gout, unspecified: Secondary | ICD-10-CM | POA: Diagnosis present

## 2011-10-12 DIAGNOSIS — K56609 Unspecified intestinal obstruction, unspecified as to partial versus complete obstruction: Secondary | ICD-10-CM

## 2011-10-12 DIAGNOSIS — N390 Urinary tract infection, site not specified: Secondary | ICD-10-CM | POA: Diagnosis present

## 2011-10-12 DIAGNOSIS — K5900 Constipation, unspecified: Secondary | ICD-10-CM | POA: Diagnosis present

## 2011-10-12 DIAGNOSIS — Z79899 Other long term (current) drug therapy: Secondary | ICD-10-CM

## 2011-10-12 DIAGNOSIS — M549 Dorsalgia, unspecified: Secondary | ICD-10-CM | POA: Diagnosis present

## 2011-10-12 DIAGNOSIS — R339 Retention of urine, unspecified: Secondary | ICD-10-CM | POA: Diagnosis present

## 2011-10-12 DIAGNOSIS — Z87891 Personal history of nicotine dependence: Secondary | ICD-10-CM

## 2011-10-12 DIAGNOSIS — E119 Type 2 diabetes mellitus without complications: Secondary | ICD-10-CM | POA: Diagnosis present

## 2011-10-12 HISTORY — DX: Umbilical hernia without obstruction or gangrene: K42.9

## 2011-10-12 LAB — URINALYSIS, ROUTINE W REFLEX MICROSCOPIC
Bilirubin Urine: NEGATIVE
Glucose, UA: NEGATIVE mg/dL
Hgb urine dipstick: NEGATIVE
Ketones, ur: 15 mg/dL — AB
Leukocytes, UA: NEGATIVE
Nitrite: NEGATIVE
Protein, ur: NEGATIVE mg/dL
Specific Gravity, Urine: 1.025 (ref 1.005–1.030)
Urobilinogen, UA: 0.2 mg/dL (ref 0.0–1.0)
pH: 5 (ref 5.0–8.0)

## 2011-10-12 NOTE — ED Notes (Signed)
5 hours ago abdominal cramping, bloating, belching. Restless. States he had an episode of the same about a month ago that resolved with time.

## 2011-10-13 ENCOUNTER — Encounter (HOSPITAL_COMMUNITY): Payer: Self-pay | Admitting: Internal Medicine

## 2011-10-13 ENCOUNTER — Emergency Department (INDEPENDENT_AMBULATORY_CARE_PROVIDER_SITE_OTHER): Payer: BC Managed Care – PPO

## 2011-10-13 DIAGNOSIS — K56609 Unspecified intestinal obstruction, unspecified as to partial versus complete obstruction: Secondary | ICD-10-CM | POA: Diagnosis present

## 2011-10-13 DIAGNOSIS — R339 Retention of urine, unspecified: Secondary | ICD-10-CM | POA: Diagnosis present

## 2011-10-13 DIAGNOSIS — R933 Abnormal findings on diagnostic imaging of other parts of digestive tract: Secondary | ICD-10-CM

## 2011-10-13 DIAGNOSIS — K5900 Constipation, unspecified: Secondary | ICD-10-CM | POA: Diagnosis present

## 2011-10-13 DIAGNOSIS — R109 Unspecified abdominal pain: Secondary | ICD-10-CM

## 2011-10-13 DIAGNOSIS — K429 Umbilical hernia without obstruction or gangrene: Secondary | ICD-10-CM

## 2011-10-13 DIAGNOSIS — K7689 Other specified diseases of liver: Secondary | ICD-10-CM

## 2011-10-13 LAB — GLUCOSE, CAPILLARY
Glucose-Capillary: 165 mg/dL — ABNORMAL HIGH (ref 70–99)
Glucose-Capillary: 194 mg/dL — ABNORMAL HIGH (ref 70–99)

## 2011-10-13 LAB — CBC
HCT: 46 % (ref 39.0–52.0)
HCT: 48.1 % (ref 39.0–52.0)
Hemoglobin: 16.3 g/dL (ref 13.0–17.0)
Hemoglobin: 17.1 g/dL — ABNORMAL HIGH (ref 13.0–17.0)
MCH: 29.5 pg (ref 26.0–34.0)
MCHC: 35.4 g/dL (ref 30.0–36.0)
MCHC: 35.6 g/dL (ref 30.0–36.0)
MCV: 83.2 fL (ref 78.0–100.0)
Platelets: 221 10*3/uL (ref 150–400)
RBC: 5.53 MIL/uL (ref 4.22–5.81)
RDW: 13.3 % (ref 11.5–15.5)
RDW: 13.5 % (ref 11.5–15.5)
WBC: 14.7 10*3/uL — ABNORMAL HIGH (ref 4.0–10.5)
WBC: 16.2 10*3/uL — ABNORMAL HIGH (ref 4.0–10.5)

## 2011-10-13 LAB — COMPREHENSIVE METABOLIC PANEL
ALT: 55 U/L — ABNORMAL HIGH (ref 0–53)
AST: 38 U/L — ABNORMAL HIGH (ref 0–37)
Albumin: 4.3 g/dL (ref 3.5–5.2)
Alkaline Phosphatase: 69 U/L (ref 39–117)
BUN: 13 mg/dL (ref 6–23)
CO2: 22 mEq/L (ref 19–32)
Calcium: 9.7 mg/dL (ref 8.4–10.5)
Chloride: 102 mEq/L (ref 96–112)
Creatinine, Ser: 0.9 mg/dL (ref 0.50–1.35)
GFR calc Af Amer: >90 mL/min (ref >90)
GFR calc non Af Amer: >90 mL/min (ref >90)
Glucose, Bld: 188 mg/dL — ABNORMAL HIGH (ref 70–99)
Potassium: 4.4 mEq/L (ref 3.5–5.1)
Sodium: 136 mEq/L (ref 135–145)
Total Bilirubin: 0.6 mg/dL (ref 0.3–1.2)
Total Protein: 7.2 g/dL (ref 6.0–8.3)

## 2011-10-13 LAB — CREATININE, SERUM
Creatinine, Ser: 0.97 mg/dL (ref 0.50–1.35)
GFR calc Af Amer: >90 mL/min (ref >90)
GFR calc non Af Amer: >90 mL/min (ref >90)

## 2011-10-13 LAB — LIPASE, BLOOD: Lipase: 40 U/L (ref 11–59)

## 2011-10-13 MED ORDER — ONDANSETRON HCL 4 MG/2ML IJ SOLN: 4.0000 mg | Freq: Four times a day (QID) | INTRAMUSCULAR | Status: DC | PRN

## 2011-10-13 MED ORDER — ONDANSETRON HCL 4 MG PO TABS: 4.0000 mg | ORAL_TABLET | Freq: Four times a day (QID) | ORAL | Status: DC | PRN

## 2011-10-13 MED ORDER — POLYETHYLENE GLYCOL 3350 17 G PO PACK
17.0000 g | PACK | Freq: Every day | ORAL | Status: DC | PRN
  Administered 2011-10-14: 17 g via ORAL
  Filled 2011-10-13: qty 1

## 2011-10-13 MED ORDER — HYDROMORPHONE HCL PF 1 MG/ML IJ SOLN
1.0000 mg | Freq: Once | INTRAMUSCULAR | Status: AC
  Administered 2011-10-13: 1 mg via INTRAVENOUS
  Filled 2011-10-13: qty 1

## 2011-10-13 MED ORDER — IOHEXOL 300 MG/ML  SOLN
20.0000 mL | Freq: Once | INTRAMUSCULAR | Status: AC | PRN
  Administered 2011-10-13: 20 mL via ORAL

## 2011-10-13 MED ORDER — FLEET ENEMA 7-19 GM/118ML RE ENEM
1.0000 | ENEMA | RECTAL | Status: DC | PRN
  Administered 2011-10-13 (×4): 1 via RECTAL
  Filled 2011-10-13 (×3): qty 1

## 2011-10-13 MED ORDER — SORBITOL 70 % SOLN
30.0000 mL | Freq: Every day | Status: DC | PRN
  Filled 2011-10-13: qty 30

## 2011-10-13 MED ORDER — HYDROMORPHONE HCL PF 1 MG/ML IJ SOLN
1.0000 mg | INTRAMUSCULAR | Status: DC | PRN
  Administered 2011-10-13 (×2): 1 mg via INTRAVENOUS
  Filled 2011-10-13 (×2): qty 1

## 2011-10-13 MED ORDER — ENOXAPARIN SODIUM 40 MG/0.4ML ~~LOC~~ SOLN
40.0000 mg | SUBCUTANEOUS | Status: DC
  Administered 2011-10-13: 40 mg via SUBCUTANEOUS
  Filled 2011-10-13 (×2): qty 0.4

## 2011-10-13 MED ORDER — KCL IN DEXTROSE-NACL 40-5-0.45 MEQ/L-%-% IV SOLN
INTRAVENOUS | Status: DC
  Administered 2011-10-13 – 2011-10-14 (×2): via INTRAVENOUS
  Filled 2011-10-13 (×2): qty 1000

## 2011-10-13 MED ORDER — INSULIN ASPART 100 UNIT/ML ~~LOC~~ SOLN
0.0000 [IU] | Freq: Three times a day (TID) | SUBCUTANEOUS | Status: DC
  Administered 2011-10-13: 2 [IU] via SUBCUTANEOUS
  Administered 2011-10-13: 1 [IU] via SUBCUTANEOUS
  Administered 2011-10-14: 3 [IU] via SUBCUTANEOUS
  Administered 2011-10-14: 2 [IU] via SUBCUTANEOUS
  Filled 2011-10-13: qty 3

## 2011-10-13 MED ORDER — SODIUM CHLORIDE 0.9 % IV BOLUS (SEPSIS)
1000.0000 mL | Freq: Once | INTRAVENOUS | Status: AC
  Administered 2011-10-13: 1000 mL via INTRAVENOUS

## 2011-10-13 MED ORDER — ONDANSETRON HCL 4 MG/2ML IJ SOLN
INTRAMUSCULAR | Status: AC
  Administered 2011-10-13: 4 mg
  Filled 2011-10-13: qty 2

## 2011-10-13 MED ORDER — HYDROMORPHONE HCL PF 1 MG/ML IJ SOLN: 1.0000 mg | Freq: Once | INTRAMUSCULAR | Status: DC

## 2011-10-13 MED ORDER — INSULIN GLARGINE 100 UNIT/ML ~~LOC~~ SOLN
10.0000 [IU] | Freq: Every day | SUBCUTANEOUS | Status: DC
  Administered 2011-10-13: 10 [IU] via SUBCUTANEOUS
  Filled 2011-10-13: qty 3

## 2011-10-13 MED ORDER — IOHEXOL 300 MG/ML  SOLN
100.0000 mL | Freq: Once | INTRAMUSCULAR | Status: AC | PRN
  Administered 2011-10-13: 100 mL via INTRAVENOUS

## 2011-10-13 NOTE — ED Notes (Signed)
Pt finished contrast, CT made aware.

## 2011-10-13 NOTE — Progress Notes (Signed)
   CARE MANAGEMENT NOTE 10/13/2011  Patient:  Greg Kane, Greg Kane   Account Number:  [id number]  Date Initiated:  10/13/2011  Documentation initiated by:  Junius Creamer  Subjective/Objective Assessment:   adm w partial sbo     Action/Plan:   lives w wife, pcp dr Myrene Buddy lowne   Anticipated DC Date:  10/15/2011   Anticipated DC Plan:  HOME/SELF CARE      DC Planning Services  CM consult      Choice offered to / List presented to:             Status of service:   Medicare Important Message given?   (If response is "NO", the following Medicare IM given date fields will be blank) Date Medicare IM given:   Date Additional Medicare IM given:    Discharge Disposition:    Per UR Regulation:  Reviewed for med. necessity/level of care/duration of stay  Comments:  1/9 ur ins review, debbie Marcanthony Sleight rn,bsn [phone]

## 2011-10-13 NOTE — ED Notes (Signed)
Resting quietly

## 2011-10-13 NOTE — ED Notes (Signed)
MD at bedside to discuss test results and disposition.

## 2011-10-13 NOTE — ED Provider Notes (Signed)
History    45yM with abdominal pain. Onset this evening. Gradual onset. Waxes and wanes but doesn't completely go away. Diffuse but worse across upper abdomen. Intermittent worsened pain. Nauseated. Vomiting. Feels bloated. No fever or chills. No urinary complaints. Hx of appendectomy many years ago. Denies recent procedure. No fever or chills.   CSN: [id number]  Arrival date & time 10/12/11  2207   First MD Initiated Contact with Patient 10/13/11 [phone]      Chief Complaint  Patient presents with  . Abdominal Pain    (Consider location/radiation/quality/duration/timing/severity/associated sxs/prior treatment) HPI  Past Medical History  Diagnosis Date  . Allergy   . Hypertension   . Diabetes mellitus   . Hyperlipidemia   . Gout     History reviewed. No pertinent past surgical history.  No family history on file.  History  Substance Use Topics  . Smoking status: Former Games developer  . Smokeless tobacco: Never Used  . Alcohol Use: Yes      Review of Systems   Review of symptoms negative unless otherwise noted in HPI.   Allergies  Penicillins  Home Medications   Current Outpatient Rx  Name Route Sig Dispense Refill  . ALLOPURINOL 100 MG PO TABS Oral Take 100 mg by mouth 2 (two) times daily as needed. For gout     . CALCIUM CARBONATE ANTACID 500 MG PO CHEW Oral Chew 2 tablets by mouth once.     . OMEGA-3 FATTY ACIDS 1000 MG PO CAPS Oral Take 1 g by mouth daily.      Marland Kitchen GLUCOSAMINE-CHONDROITIN 500-400 MG PO TABS Oral Take 2 tablets by mouth daily.      Marland Kitchen GLUCOSE BLOOD VI STRP  Use as instructed 100 each 11  . INSULIN PEN NEEDLE 32G X 5 MM MISC Does not apply 1 application by Does not apply route daily. 100 each 1  . ONETOUCH ULTRASOFT LANCETS MISC Other 1 each by Other route as needed. Use as instructed     . LIRAGLUTIDE 18 MG/3ML [place] SOLN  1.2 mg daily,  May increase to 1.8 after 1 week if needed. 18 mL 3  . LOSARTAN POTASSIUM 50 MG PO TABS Oral Take 1 tablet (50 mg  total) by mouth daily. 90 tablet 3  . METFORMIN HCL ER 500 MG PO TB24 Oral Take 1,500 mg by mouth daily.      Marland Kitchen RANITIDINE HCL 150 MG PO TABS Oral Take 150 mg by mouth once.      Marland Kitchen ROSUVASTATIN CALCIUM 20 MG PO TABS Oral Take 1 tablet (20 mg total) by mouth daily. 90 tablet 3    BP 125/82  Pulse 98  Temp(Src) 97.9 F (36.6 C) (Oral)  Resp 18  SpO2 95%  Physical Exam  Nursing note and vitals reviewed. Constitutional:       Laying in bed holding abdomen.   HENT:  Head: Normocephalic and atraumatic.  Eyes: Conjunctivae are normal. Right eye exhibits no discharge. Left eye exhibits no discharge. No scleral icterus.  Neck: Neck supple.  Cardiovascular: Normal rate, regular rhythm and normal heart sounds.  Exam reveals no gallop and no friction rub.   No murmur heard. Pulmonary/Chest: Effort normal and breath sounds normal. No respiratory distress.  Abdominal: Soft. He exhibits distension. He exhibits no mass. There is tenderness. There is no rebound and no guarding.       Easily reducible umbilical hernia. Distended. Mild diffuse tenderness.  Genitourinary:       No cva tenderness  Musculoskeletal: He exhibits no edema and no tenderness.  Neurological: He is alert.  Skin: Skin is warm and dry.  Psychiatric: He has a normal mood and affect. His behavior is normal. Thought content normal.    ED Course  Procedures (including critical care time)  Labs Reviewed  URINALYSIS, ROUTINE W REFLEX MICROSCOPIC - Abnormal; Notable for the following:    Ketones, ur 15 (*)    All other components within normal limits  CBC - Abnormal; Notable for the following:    WBC 14.7 (*)    All other components within normal limits  COMPREHENSIVE METABOLIC PANEL - Abnormal; Notable for the following:    Glucose, Bld 188 (*)    AST 38 (*)    ALT 55 (*)    All other components within normal limits  LIPASE, BLOOD   Ct Abdomen Pelvis W Contrast  10/13/2011  *RADIOLOGY REPORT*  Clinical Data: Abdominal  pain.  Distention.  CT ABDOMEN AND PELVIS WITH CONTRAST  Technique:  Multidetector CT imaging of the abdomen and pelvis was performed following the standard protocol during bolus administration of intravenous contrast.  Contrast: OMNIPAQUE IOHEXOL 300 MG/ML IV SOLN, 20mL OMNIPAQUE IOHEXOL 300 MG/ML IV SOLN  Comparison: None.  Findings: Lung bases clear aside from lingular scarring or atelectasis and some dependent atelectasis.  Visualized portions of the heart appear within normal limits.  Fatty liver is present.  Spleen appears normal.  Adrenal glands normal.  The normal renal enhancement and delayed excretion of contrast.  The pancreas appears within normal limits.  The gallbladder unremarkable.  No calcified gallstones.  No common bile duct dilation.  The stomach and small bowel proximally appear normal.  There is fecalization of distal small bowel suggesting stasis.  Fat containing umbilical hernia.   Colonic diverticulosis is present without diverticulitis.  Appendix not identified.  There is angulation of small bowel loops in the right lower quadrant, suggesting adhesive partial small bowel obstruction. Several decompressed loops of bowel are present.  This is in the region of fecalized small bowel.  Urinary bladder appears normal.  Tortuous iliac arteries.  No aggressive osseous lesions are present. Fat containing left inguinal hernia.  IMPRESSION: 1.  Fecalization of distal small bowel with several loops of decompressed small bowel and acute angulation in the right lower quadrant.  Findings suggestive of partial small bowel obstruction. 2.  The fatty liver. 3.  Small fat containing umbilical hernia. Fat containing left inguinal hernia. 4.  Appendectomy.  Original Report Authenticated By: Andreas Newport, M.D.     1. Bowel obstruction       MDM  45yM with abdominal pain. Consider obstruction, incarcerated hernia, pancreatitis, colitis, dyspepsia. CT consistent with partial SBO. Pt with  persistent pain requiring multiple doses of narcotics. Vomited once in ED. Pt with similar episode about a month ago which resolved by self. Likely conservative management but given continued symptoms will admit.        Raeford Razor, MD 10/13/11 [phone]

## 2011-10-13 NOTE — ED Notes (Signed)
CBG 165.  RN Vickie Notified

## 2011-10-13 NOTE — H&P (Signed)
PCP:   Loreen Freud, DO, DO   Chief Complaint:  Abdominal pain   HPI: 45yM with gradual onset abdominal pain over the last 24 hrs. Waxes and wanes but doesn't completely go away. Diffuse but worse across upper abdomen. Had also nausea and vomiting. He also feels bloated. Denies any fever or chills. Reports suprapubic fullness with pressure and unable to void. Has hx of appendectomy many years ago (>20).     Review of Systems:  The patient denies anorexia, fever, weight loss,, vision loss, decreased hearing, hoarseness, chest pain, syncope, dyspnea on exertion, peripheral edema, balance deficits, hemoptysis,melena, hematochezia, severe indigestion/heartburn, hematuria, incontinence, genital sores, muscle weakness, suspicious skin lesions, transient blindness, difficulty walking, depression, unusual weight change, abnormal bleeding, enlarged lymph nodes, angioedema.  Past Medical History: Past Medical History  Diagnosis Date  . Allergy   . Hypertension   . Diabetes mellitus   . Hyperlipidemia   . Gout   . Umbilical hernia    Past Surgical History  Procedure Date  . Appendectomy     1980s    Medications: Prior to Admission medications   Medication Sig Start Date End Date Taking? Authorizing Provider  allopurinol (ZYLOPRIM) 100 MG tablet Take 100 mg by mouth 2 (two) times daily as needed. For gout  04/12/11  Yes Lelon Perla, DO  calcium carbonate (TUMS - DOSED IN MG ELEMENTAL CALCIUM) 500 MG chewable tablet Chew 2 tablets by mouth once.    Yes Historical Provider, MD  fish oil-omega-3 fatty acids 1000 MG capsule Take 1 g by mouth daily.     Yes Historical Provider, MD  glucosamine-chondroitin 500-400 MG tablet Take 2 tablets by mouth daily.     Yes Historical Provider, MD  glucose blood (ONE TOUCH ULTRA TEST) test strip Use as instructed 04/12/11  Yes Yvonne R Lowne, DO  Insulin Pen Needle 32G X 5 MM MISC 1 application by Does not apply route daily. 04/12/11  Yes Lelon Perla, DO    Lancets (ONETOUCH ULTRASOFT) lancets 1 each by Other route as needed. Use as instructed    Yes Historical Provider, MD  Liraglutide 18 MG/3ML SOLN 1.2 mg daily,  May increase to 1.8 after 1 week if needed. 04/12/11  Yes Yvonne R Lowne, DO  losartan (COZAAR) 50 MG tablet Take 1 tablet (50 mg total) by mouth daily. 04/12/11 04/11/12 Yes Yvonne R Lowne, DO  metFORMIN (GLUCOPHAGE-XR) 500 MG 24 hr tablet Take 1,500 mg by mouth daily.   07/05/11 08/03/12 Yes Yvonne R Lowne, DO  ranitidine (ZANTAC) 150 MG tablet Take 150 mg by mouth once.     Yes Historical Provider, MD  rosuvastatin (CRESTOR) 20 MG tablet Take 1 tablet (20 mg total) by mouth daily. 06/14/11  Yes Lelon Perla, DO    Allergies:   Allergies  Allergen Reactions  . Penicillins Anaphylaxis    Social History:  reports that he quit smoking about 3 years ago. His smoking use included Cigarettes. He has never used smokeless tobacco. He reports that he drinks alcohol. He reports that he does not use illicit drugs.  History   Social History Narrative   MarriedChildren Chartered loss adjuster     Family History: No family history on file.  Physical Exam: Filed Vitals:   10/13/11 0555 10/13/11 0839 10/13/11 0940 10/13/11 1112  BP: 125/82 126/87 127/85 129/80  Pulse: 98 96 91 87  Temp:  98.2 F (36.8 C)  97.9 F (36.6 C)  TempSrc:  Oral    Resp:  18 14 16 20   SpO2: 95% 96% 96% 97%   General appearance: alert, cooperative and appears stated age Head: Normocephalic, without obvious abnormality, atraumatic Eyes: conjunctivae/corneas clear. PERRL, EOM's intact. Fundi benign. Ears: normal TM's and external ear canals both ears Neck: no adenopathy, no carotid bruit, no JVD, supple, symmetrical, trachea midline and thyroid not enlarged, symmetric, no tenderness/mass/nodules Resp: clear to auscultation bilaterally Cardio: regular rate and rhythm, S1, S2 normal, no murmur, click, rub or gallop GI: abnormal findings:  distended and with  a very small umbilical hernia containing fat only. No tenderness, no guarding, no rebound. Extremities: extremities normal, atraumatic, no cyanosis or edema Pulses: 2+ and symmetric Skin: Skin color, texture, turgor normal. No rashes or lesions Neurologic: Alert and oriented X 3, normal strength and tone. Normal symmetric reflexes. Normal coordination and gait   Labs on Admission:   [facility] 10/13/11 0257  NA 136  K 4.4  CL 102  CO2 22  GLUCOSE 188*  BUN 13  CREATININE 0.90  CALCIUM 9.7  MG --  PHOS --    Basename 10/13/11 0257  AST 38*  ALT 55*  ALKPHOS 69  BILITOT 0.6  PROT 7.2  ALBUMIN 4.3    Basename 10/13/11 0257  LIPASE 40  AMYLASE --    Basename 10/13/11 0257  WBC 14.7*  NEUTROABS --  HGB 16.3  HCT 46.0  MCV 83.2  PLT 221    Radiological Exams on Admission: Ct Abdomen Pelvis W Contrast  10/13/2011  *RADIOLOGY REPORT*  Clinical Data: Abdominal pain.  Distention.  CT ABDOMEN AND PELVIS WITH CONTRAST  Technique:  Multidetector CT imaging of the abdomen and pelvis was performed following the standard protocol during bolus administration of intravenous contrast.  Contrast: OMNIPAQUE IOHEXOL 300 MG/ML IV SOLN, 20mL OMNIPAQUE IOHEXOL 300 MG/ML IV SOLN  Comparison: None.  Findings: Lung bases clear aside from lingular scarring or atelectasis and some dependent atelectasis.  Visualized portions of the heart appear within normal limits.  Fatty liver is present.  Spleen appears normal.  Adrenal glands normal.  The normal renal enhancement and delayed excretion of contrast.  The pancreas appears within normal limits.  The gallbladder unremarkable.  No calcified gallstones.  No common bile duct dilation.  The stomach and small bowel proximally appear normal.  There is fecalization of distal small bowel suggesting stasis.  Fat containing umbilical hernia.   Colonic diverticulosis is present without diverticulitis.  Appendix not identified.  There is angulation of small  bowel loops in the right lower quadrant, suggesting adhesive partial small bowel obstruction. Several decompressed loops of bowel are present.  This is in the region of fecalized small bowel.  Urinary bladder appears normal.  Tortuous iliac arteries.  No aggressive osseous lesions are present. Fat containing left inguinal hernia.  IMPRESSION: 1.  Fecalization of distal small bowel with several loops of decompressed small bowel and acute angulation in the right lower quadrant.  Findings suggestive of partial small bowel obstruction. 2.  The fatty liver. 3.  Small fat containing umbilical hernia. Fat containing left inguinal hernia. 4.  Appendectomy.  Original Report Authenticated By: Andreas Newport, M.D.    Assessment/Plan Present on Admission:  .DIABETES MELLITUS, TYPE II .HYPERLIPIDEMIA .Gout, unspecified .HYPERTENSION .BACK PAIN .SBO (small bowel obstruction) .Urinary retention  46 yo man admitted with PSBO probable due to constipation although SB adhesions at the site of prior appendectomy is also a possibility. Will place NPO, iv fluids, analgesics and start enemas - if does not respond  he will need surgical consultation./ For urinary retention will place foley. SSI and Lantus    Honi Name 10/13/2011, 12:06 PM

## 2011-10-13 NOTE — ED Notes (Signed)
Patient transported to CT 

## 2011-10-13 NOTE — ED Notes (Signed)
zofran actually given at 1220

## 2011-10-13 NOTE — ED Notes (Signed)
Pt requesting additional pain meds   Will notify MD

## 2011-10-14 ENCOUNTER — Inpatient Hospital Stay (HOSPITAL_COMMUNITY): Payer: BC Managed Care – PPO

## 2011-10-14 LAB — GLUCOSE, CAPILLARY: Glucose-Capillary: 194 mg/dL — ABNORMAL HIGH (ref 70–99)

## 2011-10-14 MED ORDER — POLYETHYLENE GLYCOL 3350 17 G PO PACK: 17.0000 g | PACK | Freq: Every day | ORAL | Status: AC | PRN

## 2011-10-14 MED ORDER — LEVOFLOXACIN 500 MG PO TABS: 500.0000 mg | ORAL_TABLET | Freq: Every day | ORAL | Status: AC

## 2011-10-14 NOTE — Discharge Summary (Signed)
Patient ID: Greg Kane MRN: [id number] DOB/AGE: Oct 26, 1965 46 y.o. Primary Care Physician:Yvonne Lowne, DO, DO Admit date: 10/12/2011 Discharge date: 10/15/2011    Discharge Diagnoses:   Principal Problem:  *SBO (small bowel obstruction) Active Problems:  Urinary retention  DIABETES MELLITUS, TYPE II  HYPERLIPIDEMIA  Gout, unspecified  HYPERTENSION  BACK PAIN  Constipation  UTI (lower urinary tract infection)   Medication List  As of 10/15/2011  4:58 PM   START taking these medications         levofloxacin 500 MG tablet   Commonly known as: LEVAQUIN   Take 1 tablet (500 mg total) by mouth daily.      polyethylene glycol packet   Commonly known as: MIRALAX / GLYCOLAX   Take 17 g by mouth daily as needed (as much as needed to have a good bowel movement each day).         CONTINUE taking these medications         allopurinol 100 MG tablet   Commonly known as: ZYLOPRIM      calcium carbonate 500 MG chewable tablet   Commonly known as: TUMS - dosed in mg elemental calcium      fish oil-omega-3 fatty acids 1000 MG capsule      glucosamine-chondroitin 500-400 MG tablet      glucose blood test strip   Use as instructed      Insulin Pen Needle 32G X 5 MM Misc   1 application by Does not apply route daily.      Liraglutide 18 MG/3ML Soln   1.2 mg daily,  May increase to 1.8 after 1 week if needed.      losartan 50 MG tablet   Commonly known as: COZAAR   Take 1 tablet (50 mg total) by mouth daily.      metFORMIN 500 MG 24 hr tablet   Commonly known as: GLUCOPHAGE-XR      onetouch ultrasoft lancets      ranitidine 150 MG tablet   Commonly known as: ZANTAC      rosuvastatin 20 MG tablet   Commonly known as: CRESTOR   Take 1 tablet (20 mg total) by mouth daily.          Where to get your medications    These are the prescriptions that you need to pick up.   You may get these medications from any pharmacy.         levofloxacin 500 MG tablet   polyethylene glycol packet            Discharged Condition:  Good Able to tolerate a regular diet Able to void Having bowel movements and passing flatus   Consults: None  Significant Diagnostic Studies: Abd 1 View (kub)  10/14/2011  *RADIOLOGY REPORT*  Clinical Data: Follow-up small bowel obstruction.  No abdominal pain today.  ABDOMEN - 1 VIEW  Comparison: CT 10/12/2028  Findings: Contrast is present throughout the colon to the level of the rectum.  Several mildly dilated central small bowel loops are identified and the appearance is consistent with the impression on recent CT of a partial small bowel obstructive process.  A focal ileus could cause a similar radiographic appearance.  Bony structures appear intact and no abnormal calcifications are seen.  IMPRESSION: Progression of contrast through the colon with a few dilated small bowel loops would correlate with either partial small obstructive pattern or focal ileus.  Original Report Authenticated By: Bertha Stakes, M.D.   Ct Abdomen  Pelvis W Contrast  10/13/2011  *RADIOLOGY REPORT*  Clinical Data: Abdominal pain.  Distention.  CT ABDOMEN AND PELVIS WITH CONTRAST  Technique:  Multidetector CT imaging of the abdomen and pelvis was performed following the standard protocol during bolus administration of intravenous contrast.  Contrast: OMNIPAQUE IOHEXOL 300 MG/ML IV SOLN, 20mL OMNIPAQUE IOHEXOL 300 MG/ML IV SOLN  Comparison: None.  Findings: Lung bases clear aside from lingular scarring or atelectasis and some dependent atelectasis.  Visualized portions of the heart appear within normal limits.  Fatty liver is present.  Spleen appears normal.  Adrenal glands normal.  The normal renal enhancement and delayed excretion of contrast.  The pancreas appears within normal limits.  The gallbladder unremarkable.  No calcified gallstones.  No common bile duct dilation.  The stomach and small bowel proximally appear normal.  There is  fecalization of distal small bowel suggesting stasis.  Fat containing umbilical hernia.   Colonic diverticulosis is present without diverticulitis.  Appendix not identified.  There is angulation of small bowel loops in the right lower quadrant, suggesting adhesive partial small bowel obstruction. Several decompressed loops of bowel are present.  This is in the region of fecalized small bowel.  Urinary bladder appears normal.  Tortuous iliac arteries.  No aggressive osseous lesions are present. Fat containing left inguinal hernia.  IMPRESSION: 1.  Fecalization of distal small bowel with several loops of decompressed small bowel and acute angulation in the right lower quadrant.  Findings suggestive of partial small bowel obstruction. 2.  The fatty liver. 3.  Small fat containing umbilical hernia. Fat containing left inguinal hernia. 4.  Appendectomy.  Original Report Authenticated By: Andreas Newport, M.D.    Lab Results: Results for orders placed during the hospital encounter of 10/12/11 (from the past 48 hour(s))  GLUCOSE, CAPILLARY     Status: Abnormal   Collection Time   10/13/11  5:27 PM      Component Value Range Comment   Glucose-Capillary 190 (*) 70 - 99 (mg/dL)    Comment 1 Documented in Chart      Comment 2 Notify RN     GLUCOSE, CAPILLARY     Status: Abnormal   Collection Time   10/13/11  9:11 PM      Component Value Range Comment   Glucose-Capillary 194 (*) 70 - 99 (mg/dL)    Comment 1 Documented in Chart      Comment 2 Notify RN     GLUCOSE, CAPILLARY     Status: Abnormal   Collection Time   10/14/11 12:31 AM      Component Value Range Comment   Glucose-Capillary 194 (*) 70 - 99 (mg/dL)    Comment 1 Notify RN     GLUCOSE, CAPILLARY     Status: Abnormal   Collection Time   10/14/11  6:04 AM      Component Value Range Comment   Glucose-Capillary 211 (*) 70 - 99 (mg/dL)    Comment 1 Notify RN     GLUCOSE, CAPILLARY     Status: Abnormal   Collection Time   10/14/11 11:11 AM       Component Value Range Comment   Glucose-Capillary 182 (*) 70 - 99 (mg/dL)    Comment 1 Notify RN      No results found for this or any previous visit (from the past 240 hour(s)).   Hospital Course:  46 year old man with history of diabetes, remote appendectomy obesity presented to the emergency room with increased abdominal distention  and pain. He was diagnosed with small bowel obstruction probably due to some minor adhesions at the site of the prior appendectomy. His status was complicated by massive constipation. He also developed urinary retention probably due to a combination of a urinary tract infection and opiate medication to control the pain. He was admitted to the hospital, start intravenous fluids, enemas and a Foley catheter was inserted. By hospital day #2 he improved significantly with ability to urinate on his own, tolerating a regular diet and having numerous bowel movements. He was discharged home on MiraLAX Levaquin and to resume his old medications.  Discharge Exam: Blood pressure 127/78, pulse 94, temperature 97.7 F (36.5 C), temperature source Oral, resp. rate 20, height 5\' 10"  (1.778 m), weight 131.543 kg (290 lb), SpO2 96.00%. Alert and oriented x3 Cvs: rrr Rs: ctab Abdomen obese. Soft, nt, bs present  Disposition: home Time spent > 30 minutes  Discharge Orders    Future Orders Please Complete By Expires   Diet - low sodium heart healthy      Diet Carb Modified      Comments:   With fiber   Increase activity slowly         Follow-up Information    Follow up with Loreen Freud, DO in 1 week.         Signed: Taura Lamarre 10/15/2011, 4:58 PM

## 2011-10-14 NOTE — Progress Notes (Signed)
Subjective: better  Physical Exam: Blood pressure 127/78, pulse 94, temperature 97.7 F (36.5 C), temperature source Oral, resp. rate 20, height 5\' 10"  (1.778 m), weight 131.543 kg (290 lb), SpO2 96.00%.    Investigations:  No results found for this or any previous visit (from the past 240 hour(s)).   Basic Metabolic Panel:  Basename 10/13/11 1345 10/13/11 0257  NA -- 136  K -- 4.4  CL -- 102  CO2 -- 22  GLUCOSE -- 188*  BUN -- 13  CREATININE 0.97 0.90  CALCIUM -- 9.7  MG -- --  PHOS -- --   Liver Function Tests:  [facility] 10/13/11 0257  AST 38*  ALT 55*  ALKPHOS 69  BILITOT 0.6  PROT 7.2  ALBUMIN 4.3     CBC:  Basename 10/13/11 1345 10/13/11 0257  WBC 16.2* 14.7*  NEUTROABS -- --  HGB 17.1* 16.3  HCT 48.1 46.0  MCV 86.7 83.2  PLT 247 221    Ct Abdomen Pelvis W Contrast  10/13/2011  *RADIOLOGY REPORT*  Clinical Data: Abdominal pain.  Distention.  CT ABDOMEN AND PELVIS WITH CONTRAST  Technique:  Multidetector CT imaging of the abdomen and pelvis was performed following the standard protocol during bolus administration of intravenous contrast.  Contrast: OMNIPAQUE IOHEXOL 300 MG/ML IV SOLN, 20mL OMNIPAQUE IOHEXOL 300 MG/ML IV SOLN  Comparison: None.  Findings: Lung bases clear aside from lingular scarring or atelectasis and some dependent atelectasis.  Visualized portions of the heart appear within normal limits.  Fatty liver is present.  Spleen appears normal.  Adrenal glands normal.  The normal renal enhancement and delayed excretion of contrast.  The pancreas appears within normal limits.  The gallbladder unremarkable.  No calcified gallstones.  No common bile duct dilation.  The stomach and small bowel proximally appear normal.  There is fecalization of distal small bowel suggesting stasis.  Fat containing umbilical hernia.   Colonic diverticulosis is present without diverticulitis.  Appendix not identified.  There is angulation of small bowel loops in the  right lower quadrant, suggesting adhesive partial small bowel obstruction. Several decompressed loops of bowel are present.  This is in the region of fecalized small bowel.  Urinary bladder appears normal.  Tortuous iliac arteries.  No aggressive osseous lesions are present. Fat containing left inguinal hernia.  IMPRESSION: 1.  Fecalization of distal small bowel with several loops of decompressed small bowel and acute angulation in the right lower quadrant.  Findings suggestive of partial small bowel obstruction. 2.  The fatty liver. 3.  Small fat containing umbilical hernia. Fat containing left inguinal hernia. 4.  Appendectomy.  Original Report Authenticated By: Andreas Newport, M.D.      Medications:  Scheduled:   . DISCONTD: enoxaparin  40 mg Subcutaneous Q24H  . DISCONTD: insulin aspart  0-9 Units Subcutaneous TID WC  . DISCONTD: insulin glargine  10 Units Subcutaneous QHS    Impression:  Principal Problem:  *SBO (small bowel obstruction) Active Problems:  Urinary retention  DIABETES MELLITUS, TYPE II  HYPERLIPIDEMIA  Gout, unspecified  HYPERTENSION  BACK PAIN  Constipation     Plan: home     LOS: 2 days   Shulamis Wenberg, MD Pager: [phone] 10/14/2011, 7:59 AM

## 2011-10-14 NOTE — Progress Notes (Signed)
Inpatient Diabetes Program Recommendations  AACE/ADA: New Consensus Statement on Inpatient Glycemic Control (2009)  Target Ranges:  Prepandial:   less than 140 mg/dL      Peak postprandial:   less than 180 mg/dL (1-2 hours)      Critically ill patients:  140 - 180 mg/dL   Reason for Visit: Results for CHAMP, KEETCH (MRN [id number]) as of 10/14/2011 09:19  Ref. Range 10/13/2011 12:25 10/13/2011 17:27 10/13/2011 21:11 10/14/2011 00:31 10/14/2011 06:04  Glucose-Capillary Latest Range: 70-99 mg/dL 409 (H) 811 (H) 914 (H) 194 (H) 211 (H)    Inpatient Diabetes Program Recommendations Correction (SSI): Consider increasing correction to moderate tid wc HgbA1C: Please order A1C to assess prehospitalization glycemic control  Note: Will follow.

## 2011-10-14 NOTE — Progress Notes (Signed)
D/C instructions reviewed with pt and his wife. Both verbalized understanding of all instructions. Copy of instructions and script given to pt. Pt to f/u with medical MD next week. Pt d/c'd via wheelchair with belongings with wife escorted by hospital volunteer.

## 2011-10-15 DIAGNOSIS — N390 Urinary tract infection, site not specified: Secondary | ICD-10-CM | POA: Diagnosis present

## 2012-02-12 IMAGING — CT CT ABD-PELV W/ CM
2 of 5 series · 16 of 46 positions shown, 18 images · IV contrast (APPLIED)
Comparison: None.

CLINICAL DATA: Abdominal pain.  Distention.

CT ABDOMEN AND PELVIS WITH CONTRAST
TECHNIQUE: Multidetector CT imaging of the abdomen and pelvis was
performed following the standard protocol during bolus
administration of intravenous contrast.
Contrast: 100mL OMNIPAQUE IOHEXOL 300 MG/ML IV SOLN, 20mL OMNIPAQUE
IOHEXOL 300 MG/ML IV SOLN

[Series 2: abd/pelvis 5.0 b31f · axial · 0.89mm/px · z∈[+603,+1063]mm · 13 of 104 slices shown, 15 images]
[im 6/104  soft-tissue]
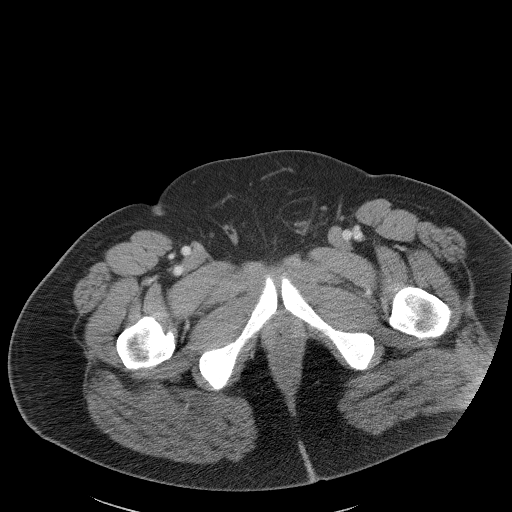
[im 6/104  bone]
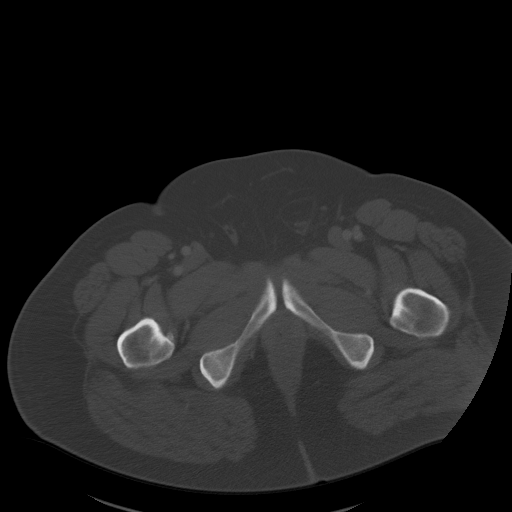
[im 17/104  soft-tissue]
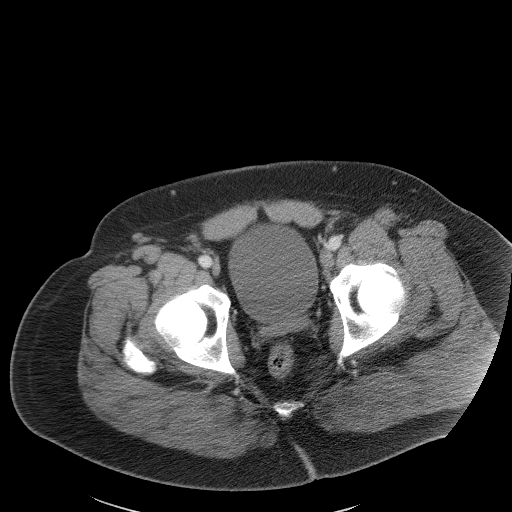
[im 22/104  soft-tissue]
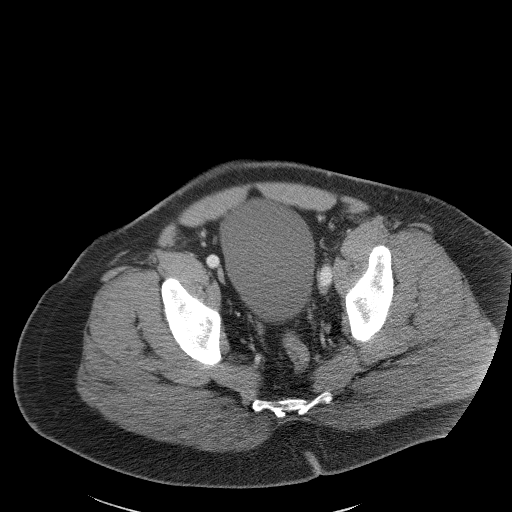
[im 28/104  soft-tissue]
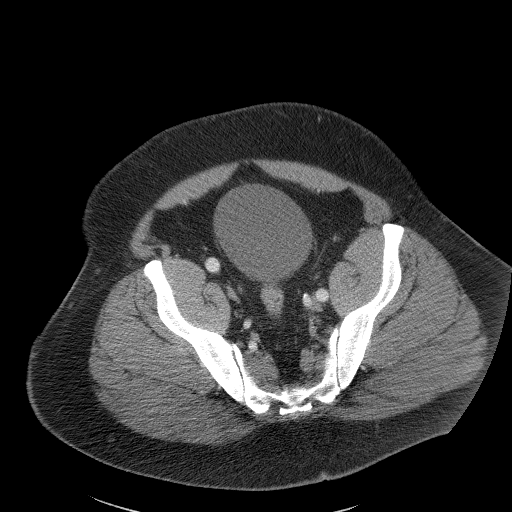
[im 38/104  soft-tissue]
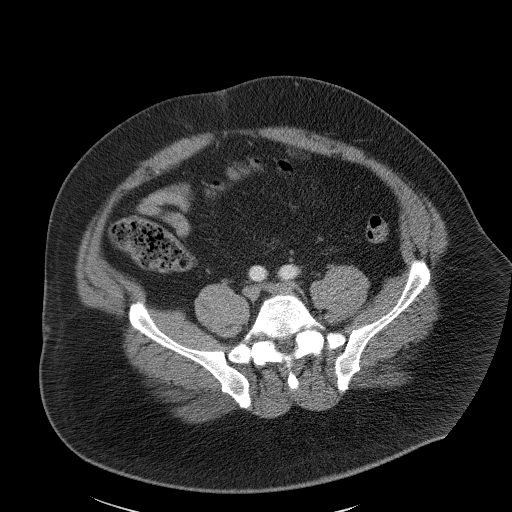
[im 44/104  soft-tissue]
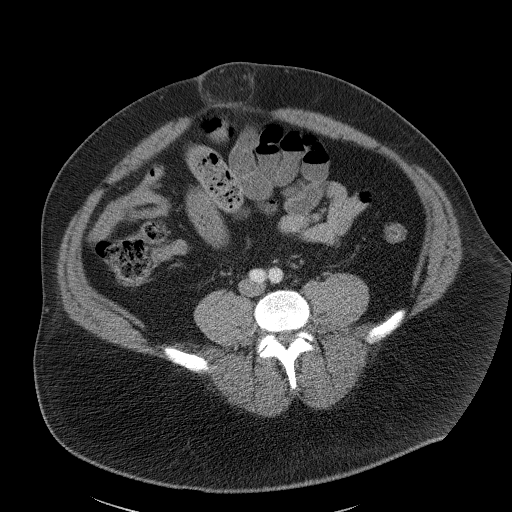
[im 55/104  soft-tissue]
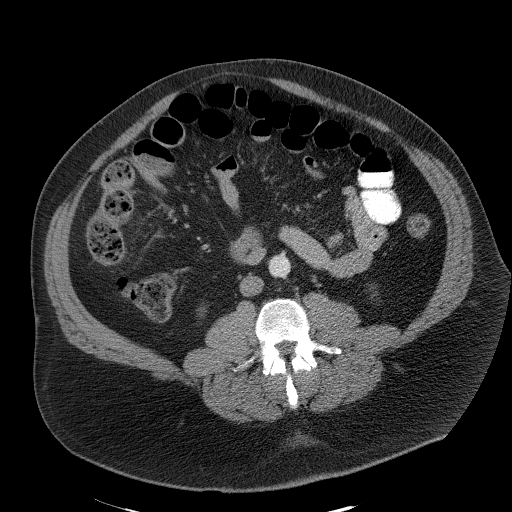
[im 60/104  soft-tissue]
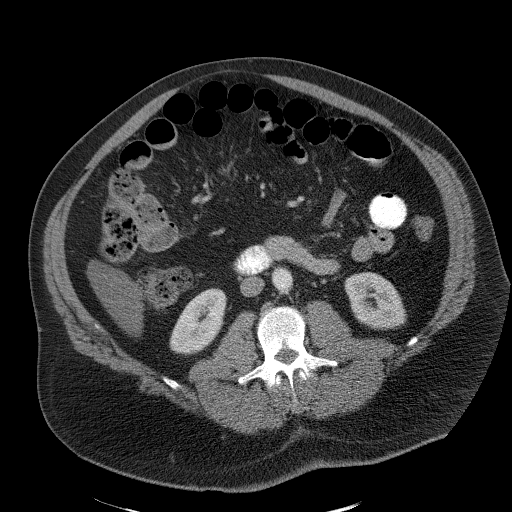
[im 66/104  soft-tissue]
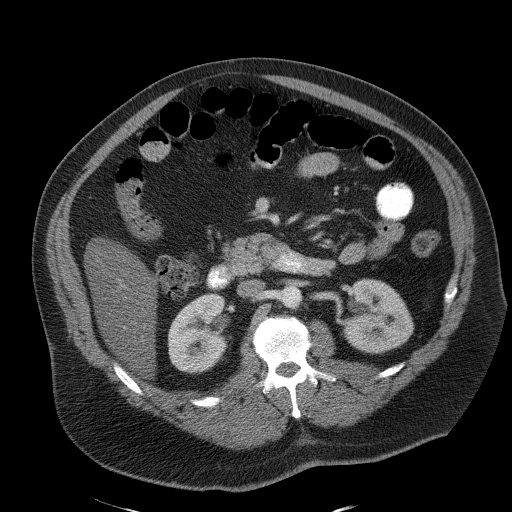
[im 66/104  bone]
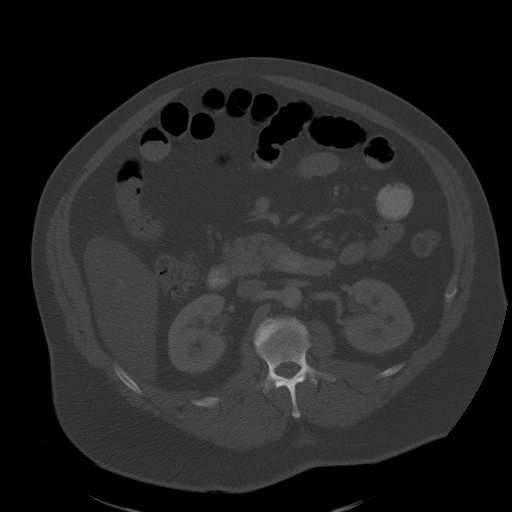
[im 76/104  soft-tissue]
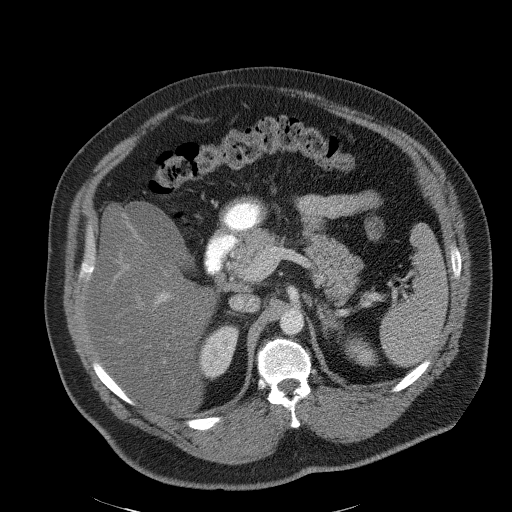
[im 82/104  soft-tissue]
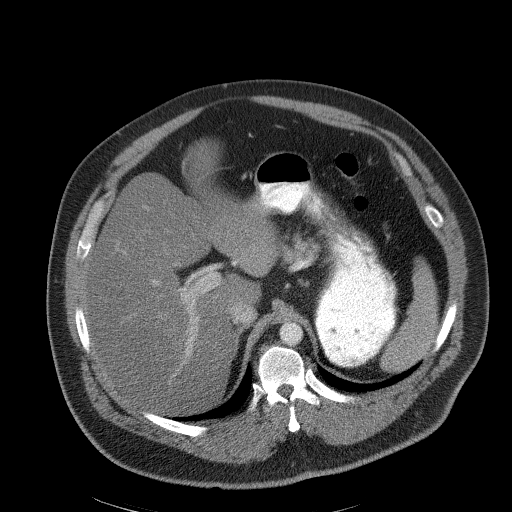
[im 87/104  soft-tissue]
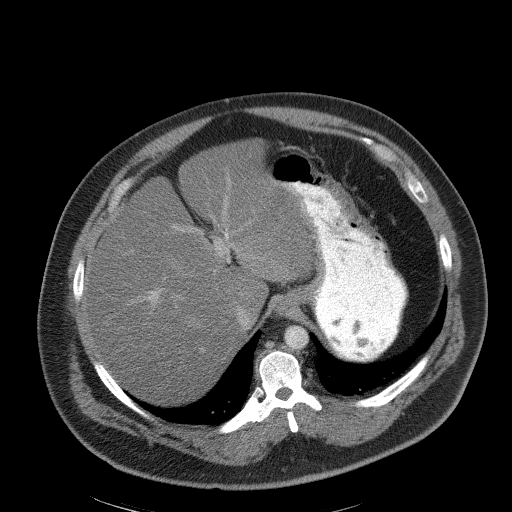
[im 98/104  soft-tissue]
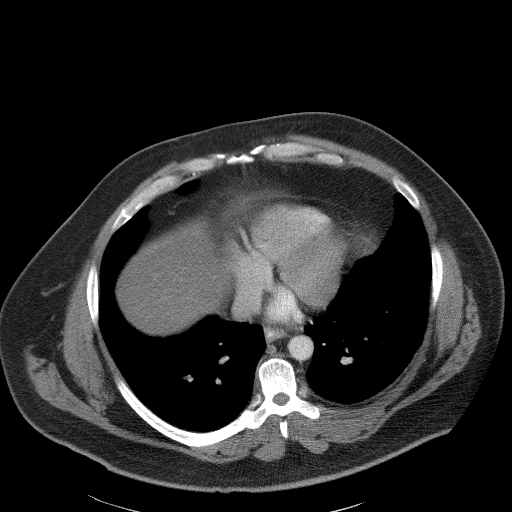

[Series 5: abd/pelvis 3.0 coronal · coronal · 0.97mm/px · 3 of 125 slices shown]
[im 42/125  soft-tissue]
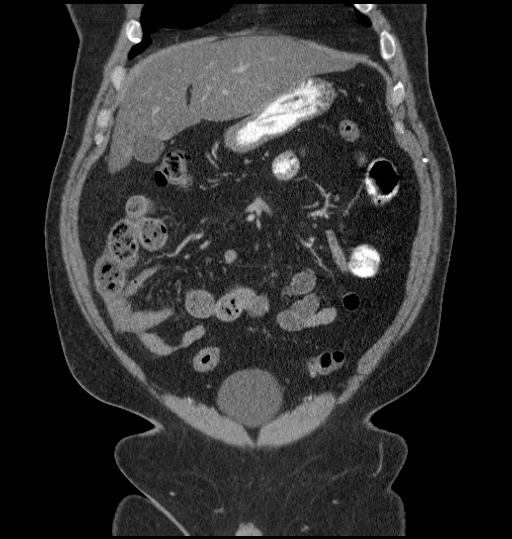
[im 56/125  soft-tissue]
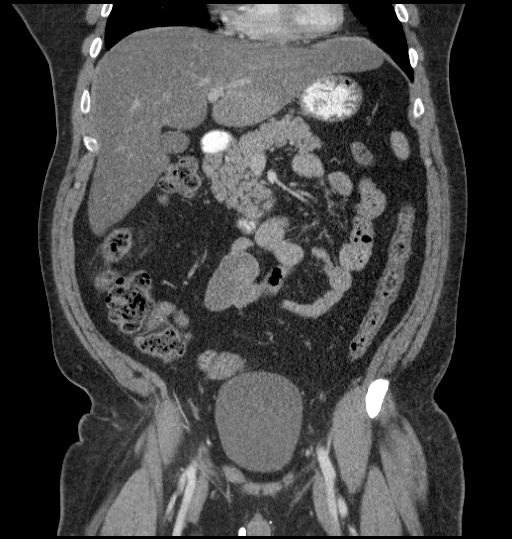
[im 69/125  soft-tissue]
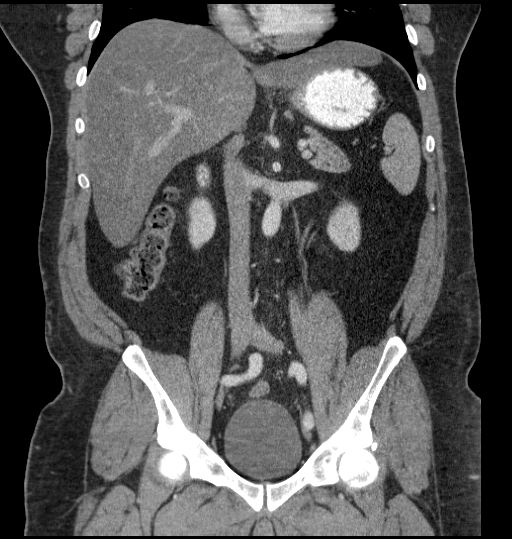

[16 of 46 positions shown; findings below may reference images not displayed]

FINDINGS: Lung bases clear aside from lingular scarring or
atelectasis and some dependent atelectasis.  Visualized portions of
the heart appear within normal limits.

Fatty liver is present.  Spleen appears normal.  Adrenal glands
normal.  The normal renal enhancement and delayed excretion of
contrast.  The pancreas appears within normal limits.  The
gallbladder unremarkable.  No calcified gallstones.  No common bile
duct dilation.  The stomach and small bowel proximally appear
normal.  There is fecalization of distal small bowel suggesting
stasis.  Fat containing umbilical hernia.   Colonic diverticulosis
is present without diverticulitis.  Appendix not identified.

There is angulation of small bowel loops in the right lower
quadrant, suggesting adhesive partial small bowel obstruction.
Several decompressed loops of bowel are present.  This is in the
region of fecalized small bowel.

Urinary bladder appears normal.  Tortuous iliac arteries.  No
aggressive osseous lesions are present. Fat containing left
inguinal hernia.
IMPRESSION: 1.  Fecalization of distal small bowel with several loops of
decompressed small bowel and acute angulation in the right lower
quadrant.  Findings suggestive of partial small bowel obstruction.
2.  The fatty liver.
3.  Small fat containing umbilical hernia. Fat containing left
inguinal hernia.
4.  Appendectomy.

## 2012-03-21 ENCOUNTER — Other Ambulatory Visit: Payer: Self-pay | Admitting: Family Medicine

## 2012-04-14 ENCOUNTER — Encounter: Payer: Self-pay | Admitting: Family Medicine

## 2012-04-14 ENCOUNTER — Ambulatory Visit (INDEPENDENT_AMBULATORY_CARE_PROVIDER_SITE_OTHER): Payer: BC Managed Care – PPO | Admitting: Family Medicine

## 2012-04-14 VITALS — BP 144/88 | HR 99 | Temp 98.0°F | Wt 278.4 lb

## 2012-04-14 DIAGNOSIS — S39012A Strain of muscle, fascia and tendon of lower back, initial encounter: Secondary | ICD-10-CM

## 2012-04-14 DIAGNOSIS — E785 Hyperlipidemia, unspecified: Secondary | ICD-10-CM

## 2012-04-14 DIAGNOSIS — E119 Type 2 diabetes mellitus without complications: Secondary | ICD-10-CM

## 2012-04-14 DIAGNOSIS — S335XXA Sprain of ligaments of lumbar spine, initial encounter: Secondary | ICD-10-CM

## 2012-04-14 DIAGNOSIS — I10 Essential (primary) hypertension: Secondary | ICD-10-CM

## 2012-04-14 MED ORDER — HYDROCODONE-ACETAMINOPHEN 7.5-750 MG PO TABS: 1.0000 | ORAL_TABLET | Freq: Three times a day (TID) | ORAL | Status: DC | PRN

## 2012-04-14 MED ORDER — MELOXICAM 15 MG PO TABS: 15.0000 mg | ORAL_TABLET | Freq: Every day | ORAL | Status: DC

## 2012-04-14 MED ORDER — CYCLOBENZAPRINE HCL 10 MG PO TABS: 10.0000 mg | ORAL_TABLET | Freq: Three times a day (TID) | ORAL | Status: AC | PRN

## 2012-04-14 NOTE — Patient Instructions (Addendum)
Back Pain, Adult Low back pain is very common. About 1 in 5 people have back pain.The cause of low back pain is rarely dangerous. The pain often gets better over time.About half of people with a sudden onset of back pain feel better in just 2 weeks. About 8 in 10 people feel better by 6 weeks.  CAUSES Some common causes of back pain include:  Strain of the muscles or ligaments supporting the spine.   Wear and tear (degeneration) of the spinal discs.   Arthritis.   Direct injury to the back.  DIAGNOSIS Most of the time, the direct cause of low back pain is not known.However, back pain can be treated effectively even when the exact cause of the pain is unknown.Answering your caregiver's questions about your overall health and symptoms is one of the most accurate ways to make sure the cause of your pain is not dangerous. If your caregiver needs more information, he or she may order lab work or imaging tests (X-rays or MRIs).However, even if imaging tests show changes in your back, this usually does not require surgery. HOME CARE INSTRUCTIONS For many people, back pain returns.Since low back pain is rarely dangerous, it is often a condition that people can learn to manageon their own.   Remain active. It is stressful on the back to sit or stand in one place. Do not sit, drive, or stand in one place for more than 30 minutes at a time. Take short walks on level surfaces as soon as pain allows.Try to increase the length of time you walk each day.   Do not stay in bed.Resting more than 1 or 2 days can delay your recovery.   Do not avoid exercise or work.Your body is made to move.It is not dangerous to be active, even though your back may hurt.Your back will likely heal faster if you return to being active before your pain is gone.   Pay attention to your body when you bend and lift. Many people have less discomfortwhen lifting if they bend their knees, keep the load close to their  bodies,and avoid twisting. Often, the most comfortable positions are those that put less stress on your recovering back.   Find a comfortable position to sleep. Use a firm mattress and lie on your side with your knees slightly bent. If you lie on your back, put a pillow under your knees.   Only take over-the-counter or prescription medicines as directed by your caregiver. Over-the-counter medicines to reduce pain and inflammation are often the most helpful.Your caregiver may prescribe muscle relaxant drugs.These medicines help dull your pain so you can more quickly return to your normal activities and healthy exercise.   Put ice on the injured area.   Put ice in a plastic bag.   Place a towel between your skin and the bag.   Leave the ice on for 15 to 20 minutes, 3 to 4 times a day for the first 2 to 3 days. After that, ice and heat may be alternated to reduce pain and spasms.   Ask your caregiver about trying back exercises and gentle massage. This may be of some benefit.   Avoid feeling anxious or stressed.Stress increases muscle tension and can worsen back pain.It is important to recognize when you are anxious or stressed and learn ways to manage it.Exercise is a great option.  SEEK MEDICAL CARE IF:  You have pain that is not relieved with rest or medicine.   You have   pain that does not improve in 1 week.   You have new symptoms.   You are generally not feeling well.  SEEK IMMEDIATE MEDICAL CARE IF:   You have pain that radiates from your back into your legs.   You develop new bowel or bladder control problems.   You have unusual weakness or numbness in your arms or legs.   You develop nausea or vomiting.   You develop abdominal pain.   You feel faint.  Document Released: 09/20/2005 Document Revised: 09/09/2011 Document Reviewed: 02/08/2011 [employer] Patient Information 2012 [employer]. 

## 2012-04-14 NOTE — Progress Notes (Signed)
  Subjective:    Greg Kane is a 46 y.o. male who presents for follow up of low back problems. Current symptoms include: numbness in R leg to knee. Symptoms have worsened from the previous visit. Exacerbating factors identified by the patient are sitting, standing and walking.  The following portions of the patient's history were reviewed and updated as appropriate: allergies, current medications, past family history, past medical history, past social history, past surgical history and problem list.    Objective:    BP 144/88  Pulse 99  Temp 98 F (36.7 C) (Oral)  Wt 278 lb 6.4 oz (126.281 kg)  SpO2 98% General appearance: alert, cooperative, appears stated age and no distress Extremities: extremities normal, atraumatic, no cyanosis or edema Neurologic: Sensory: normal Motor: grossly normal Reflexes: 2+ and symmetric Gait: Antalgic    Assessment:    Nonspecific acute low back pain    Plan:    Natural history and expected course discussed. Questions answered. Agricultural engineer distributed. Stretching exercises discussed. Regular aerobic and trunk strengthening exercises discussed. Short (2-4 day) period of relative rest recommended until acute symptoms improve. Ice to affected area as needed for local pain relief. Heat to affected area as needed for local pain relief. Muscle relaxants per medication orders. Follow-up in 2 weeks.

## 2012-04-26 ENCOUNTER — Other Ambulatory Visit (INDEPENDENT_AMBULATORY_CARE_PROVIDER_SITE_OTHER): Payer: BC Managed Care – PPO

## 2012-04-26 DIAGNOSIS — E119 Type 2 diabetes mellitus without complications: Secondary | ICD-10-CM

## 2012-04-26 DIAGNOSIS — E785 Hyperlipidemia, unspecified: Secondary | ICD-10-CM

## 2012-04-26 DIAGNOSIS — I10 Essential (primary) hypertension: Secondary | ICD-10-CM

## 2012-04-26 LAB — MICROALBUMIN / CREATININE URINE RATIO: Microalb, Ur: 0.2 mg/dL (ref 0.0–1.9)

## 2012-04-26 LAB — HEPATIC FUNCTION PANEL
Alkaline Phosphatase: 53 U/L (ref 39–117)
Bilirubin, Direct: 0.1 mg/dL (ref 0.0–0.3)
Total Bilirubin: 0.9 mg/dL (ref 0.3–1.2)
Total Protein: 7.4 g/dL (ref 6.0–8.3)

## 2012-04-26 LAB — BASIC METABOLIC PANEL
BUN: 13 mg/dL (ref 6–23)
CO2: 26 mEq/L (ref 19–32)
Chloride: 100 mEq/L (ref 96–112)
Creatinine, Ser: 0.9 mg/dL (ref 0.4–1.5)
Glucose, Bld: 109 mg/dL — ABNORMAL HIGH (ref 70–99)

## 2012-04-26 LAB — LIPID PANEL
Total CHOL/HDL Ratio: 6
Triglycerides: 191 mg/dL — ABNORMAL HIGH (ref 0.0–149.0)
VLDL: 38.2 mg/dL (ref 0.0–40.0)

## 2012-04-26 NOTE — Progress Notes (Signed)
Lab only 

## 2012-05-04 ENCOUNTER — Other Ambulatory Visit: Payer: Self-pay | Admitting: *Deleted

## 2012-05-04 MED ORDER — FENOFIBRATE MICRONIZED 130 MG PO CAPS: 130.0000 mg | ORAL_CAPSULE | Freq: Every day | ORAL | Status: DC

## 2012-05-15 ENCOUNTER — Other Ambulatory Visit: Payer: Self-pay | Admitting: Family Medicine

## 2012-05-25 ENCOUNTER — Other Ambulatory Visit: Payer: Self-pay | Admitting: Family Medicine

## 2012-05-27 ENCOUNTER — Other Ambulatory Visit: Payer: Self-pay | Admitting: Family Medicine

## 2012-08-09 ENCOUNTER — Other Ambulatory Visit: Payer: Self-pay | Admitting: Family Medicine

## 2012-09-30 ENCOUNTER — Other Ambulatory Visit: Payer: Self-pay | Admitting: Family Medicine

## 2012-10-02 NOTE — Telephone Encounter (Signed)
Letter mailed to schedule a lab apt.     KP

## 2012-10-09 ENCOUNTER — Encounter: Payer: Self-pay | Admitting: Family Medicine

## 2012-10-09 ENCOUNTER — Ambulatory Visit (INDEPENDENT_AMBULATORY_CARE_PROVIDER_SITE_OTHER): Payer: BC Managed Care – PPO | Admitting: Family Medicine

## 2012-10-09 VITALS — BP 122/90 | HR 103 | Temp 98.3°F | Wt 287.6 lb

## 2012-10-09 DIAGNOSIS — J40 Bronchitis, not specified as acute or chronic: Secondary | ICD-10-CM

## 2012-10-09 MED ORDER — AZITHROMYCIN 250 MG PO TABS: ORAL_TABLET | ORAL | Status: DC

## 2012-10-09 NOTE — Progress Notes (Signed)
  Subjective:     Greg Kane is a 47 y.o. male here for evaluation of a cough. Onset of symptoms was 1 week ago. Symptoms have been gradually worsening since that time. The cough is productive and is aggravated by exercise, infection, stress and pollens. Associated symptoms include: chills, fever, shortness of breath and sputum production. Patient does not have a history of asthma. Patient does not have a history of environmental allergens. Patient has not traveled recently. Patient does not have a history of smoking. Patient has not had a previous chest x-ray. Patient has not had a PPD done.  The following portions of the patient's history were reviewed and updated as appropriate: allergies, current medications, past family history, past medical history, past social history, past surgical history and problem list.  Review of Systems Pertinent items are noted in HPI.    Objective:    Oxygen saturation 97% on room air BP 122/90  Pulse 103  Temp 98.3 F (36.8 C) (Oral)  Wt 287 lb 9.6 oz (130.455 kg)  SpO2 97% General appearance: alert, cooperative, appears stated age and no distress Ears: normal TM's and external ear canals both ears Nose: Nares normal. Septum midline. Mucosa normal. No drainage or sinus tenderness. Throat: lips, mucosa, and tongue normal; teeth and gums normal Neck: no adenopathy, supple, symmetrical, trachea midline and thyroid not enlarged, symmetric, no tenderness/mass/nodules Lungs: rhonchi bilaterally Heart: S1, S2 normal    Assessment:    Acute Bronchitis    Plan:    Antibiotics per medication orders. Avoid exposure to tobacco smoke and fumes. Call if shortness of breath worsens, blood in sputum, change in character of cough, development of fever or chills, inability to maintain nutrition and hydration. Avoid exposure to tobacco smoke and fumes. otc cough meds,  f/u prn

## 2012-10-09 NOTE — Patient Instructions (Signed)

## 2012-11-21 ENCOUNTER — Other Ambulatory Visit: Payer: Self-pay | Admitting: Family Medicine

## 2012-11-23 ENCOUNTER — Other Ambulatory Visit: Payer: Self-pay | Admitting: Family Medicine

## 2013-01-01 ENCOUNTER — Other Ambulatory Visit: Payer: Self-pay | Admitting: Family Medicine

## 2013-01-27 ENCOUNTER — Other Ambulatory Visit: Payer: Self-pay | Admitting: Family Medicine

## 2013-02-20 ENCOUNTER — Other Ambulatory Visit: Payer: Self-pay | Admitting: Family Medicine

## 2013-02-21 NOTE — Telephone Encounter (Signed)
I have called and scheduled his CPE for 04/09/13 and labs on 04/03/13. Please advise which labs you would like? LIPID, HEP, BMP, CBC-D, A1C, MICRO AND PSA?

## 2013-04-03 ENCOUNTER — Other Ambulatory Visit (INDEPENDENT_AMBULATORY_CARE_PROVIDER_SITE_OTHER): Payer: BC Managed Care – PPO

## 2013-04-03 DIAGNOSIS — E119 Type 2 diabetes mellitus without complications: Secondary | ICD-10-CM

## 2013-04-03 DIAGNOSIS — E785 Hyperlipidemia, unspecified: Secondary | ICD-10-CM

## 2013-04-03 LAB — BASIC METABOLIC PANEL
CO2: 25 mEq/L (ref 19–32)
Chloride: 103 mEq/L (ref 96–112)
Creatinine, Ser: 1 mg/dL (ref 0.4–1.5)
GFR: 86.28 mL/min (ref >60.00)
Potassium: 4 mEq/L (ref 3.5–5.1)

## 2013-04-03 LAB — HEPATIC FUNCTION PANEL
Albumin: 4.3 g/dL (ref 3.5–5.2)
Alkaline Phosphatase: 61 U/L (ref 39–117)
Bilirubin, Direct: 0 mg/dL (ref 0.0–0.3)
Total Protein: 7.5 g/dL (ref 6.0–8.3)

## 2013-04-03 LAB — LIPID PANEL
Cholesterol: 260 mg/dL — ABNORMAL HIGH (ref 0–200)
Total CHOL/HDL Ratio: 9
VLDL: 41.6 mg/dL — ABNORMAL HIGH (ref 0.0–40.0)

## 2013-04-03 LAB — LDL CHOLESTEROL, DIRECT: Direct LDL: 183 mg/dL

## 2013-04-09 ENCOUNTER — Encounter: Payer: BC Managed Care – PPO | Admitting: Family Medicine

## 2013-04-18 ENCOUNTER — Telehealth: Payer: Self-pay

## 2013-04-18 DIAGNOSIS — E119 Type 2 diabetes mellitus without complications: Secondary | ICD-10-CM

## 2013-04-18 NOTE — Telephone Encounter (Signed)
Patient is calling back to speak with Selena Batten.  He can be reached [phone].

## 2013-04-18 NOTE — Telephone Encounter (Signed)
Message copied by Arnette Norris on Wed Apr 18, 2013  3:46 PM ------      Message from: Lelon Perla      Created: Tue Apr 03, 2013  4:51 PM       Dm and cholesterol completely uncontrolled--- refer to endo and lipid clinic ------

## 2013-04-18 NOTE — Telephone Encounter (Signed)
Discussed with wife and she will have the patient call back.       KP

## 2013-04-19 ENCOUNTER — Other Ambulatory Visit: Payer: Self-pay | Admitting: Family Medicine

## 2013-04-19 MED ORDER — ALLOPURINOL 100 MG PO TABS: 100.0000 mg | ORAL_TABLET | Freq: Two times a day (BID) | ORAL | Status: DC | PRN

## 2013-04-19 MED ORDER — ATORVASTATIN CALCIUM 20 MG PO TABS: 20.0000 mg | ORAL_TABLET | Freq: Every day | ORAL | Status: DC

## 2013-04-19 NOTE — Telephone Encounter (Signed)
Spoke with patient and advised of lab results along with recommendations. Patient ok with endo, would like to restart a statin to see if that can help get the cholesterol before he goes to the lipid clinic. He states that he has not been on medication for cholesterol for the last 6 months. Please advise. GF/RN

## 2013-04-19 NOTE — Telephone Encounter (Signed)
Lipitor 20 mg #30  1 po qpm , 2 refills---recheck 3 months

## 2013-04-19 NOTE — Telephone Encounter (Signed)
Ref put in and Rx sent to the pharmacy. Patient has been made aware      KP

## 2013-05-03 ENCOUNTER — Ambulatory Visit (INDEPENDENT_AMBULATORY_CARE_PROVIDER_SITE_OTHER): Payer: BC Managed Care – PPO | Admitting: Family Medicine

## 2013-05-03 ENCOUNTER — Encounter: Payer: Self-pay | Admitting: Family Medicine

## 2013-05-03 VITALS — BP 118/90 | HR 87 | Temp 98.1°F | Wt 285.2 lb

## 2013-05-03 DIAGNOSIS — H00019 Hordeolum externum unspecified eye, unspecified eyelid: Secondary | ICD-10-CM

## 2013-05-03 DIAGNOSIS — H109 Unspecified conjunctivitis: Secondary | ICD-10-CM

## 2013-05-03 DIAGNOSIS — H00016 Hordeolum externum left eye, unspecified eyelid: Secondary | ICD-10-CM

## 2013-05-03 MED ORDER — POLYMYXIN B-TRIMETHOPRIM 10000-0.1 UNIT/ML-% OP SOLN: OPHTHALMIC | Status: DC

## 2013-05-03 NOTE — Patient Instructions (Addendum)
Use the eye drops as directed to treat a possible bacterial infection Apply hot compresses to the eye to help w/ pain/swelling If you have visual changes, worsening pain/drainage, or other concerns- please call Hang in there!

## 2013-05-03 NOTE — Progress Notes (Signed)
  Subjective:    Patient ID: Greg Kane, male    DOB: [birth year], 47 y.o.   MRN: [id number]  HPI Eye infxn- L eye, sxs started Tuesday night.  Was nearly swollen shut yesterday and 'oozing green mucous'.  Slight HA.  No visual changes.  No gritty or burning sensation to eye.  No one at home w/ similar sxs.     Review of Systems For ROS see HPI     Objective:   Physical Exam  Vitals reviewed. Constitutional: He appears well-developed and well-nourished. No distress.  HENT:  Head: Normocephalic and atraumatic.  Eyes: EOM are normal. Pupils are equal, round, and reactive to light. Right eye exhibits no discharge. Left eye exhibits no discharge.  L upper lid edematous w/out obvious induration or stye, no TTP L conjunctiva injected w/out obvious purulent or mucous drainage          Assessment & Plan:

## 2013-05-03 NOTE — Assessment & Plan Note (Signed)
New.  Pt to apply hot compresses.  Not having pain or visual changes.  Reviewed supportive care and red flags that should prompt return.  Pt expressed understanding and is in agreement w/ plan.

## 2013-05-03 NOTE — Assessment & Plan Note (Signed)
New.  Not obvious on PE today but pt reports sxs were severe yesterday.  Start topical abx.  Reviewed supportive care and red flags that should prompt return.  Pt expressed understanding and is in agreement w/ plan.

## 2013-05-10 ENCOUNTER — Ambulatory Visit (INDEPENDENT_AMBULATORY_CARE_PROVIDER_SITE_OTHER): Payer: BC Managed Care – PPO | Admitting: Endocrinology

## 2013-05-10 ENCOUNTER — Encounter: Payer: Self-pay | Admitting: Endocrinology

## 2013-05-10 VITALS — BP 116/70 | HR 94 | Temp 98.0°F | Resp 12 | Ht 69.0 in | Wt 285.5 lb

## 2013-05-10 DIAGNOSIS — E785 Hyperlipidemia, unspecified: Secondary | ICD-10-CM

## 2013-05-10 DIAGNOSIS — I10 Essential (primary) hypertension: Secondary | ICD-10-CM

## 2013-05-10 NOTE — Progress Notes (Signed)
Patient ID: Greg Kane, male   DOB: 01-10-1966, 47 y.o.   MRN: [id number]    Reason for Appointment : Consultation for Type 2 Diabetes  History of Present Illness          Diagnosis: Type 2 diabetes mellitus, date of diagnosis 2008  Initial diagnosis was made incidentally and had only mild hyperglycemia. Initially was treated with metformin alone and then Actos was added He has not had any formal diabetes education and has had difficulty losing weight His blood sugars have been only fairly well controlled with A1c usually in the 7-8 range Because of inadequate control last year he was given Victoza injection in addition. However Actos was stopped  He thinks this helped bring his blood sugars better controlled but helped his portion control only slightly He did not lose much weight with Victoza He has been fairly compliant with his medications but is exercising on and off and until recently not paying attention to diet.  Blood sugars have been higher since June and he has been also feeling sluggish; he thought that this was related to excessive work pressure He has not had any prednisone recently although has had this occasionally for his back pain which would raise his blood sugars  Also in the last few weeks he has started paying attention to his portions, carbohydrate intake and trying to use a smoothie with greek yogurt in the morning He thinks he has lost 15 pounds with modification of his diet  He has had a relatively high A1c of 9% last month and has been referred here for further management Since he was not sure if he will continue Victoza he stopped this for the last 2 weeks Monitors blood glucose: Twice a day.     Blood Glucose readings from meter : readings before breakfast: Usually 210-240, in the afternoon about 300          Physical activity: Has been doing  exercise off and on   Component     Latest Ref Rng 08/12/2008 02/19/2009 06/02/2009 03/09/2010 02/17/2011  Hemoglobin  A1C     4.6 - 6.5 % 6.9 (H) 7.1 (H) 7.0 (H) 7.4 (H) 8.0 (H)   Component     Latest Ref Rng 06/14/2011 04/26/2012 04/03/2013  Hemoglobin A1C     4.6 - 6.5 % 7.4 (H) 8.1 (H) 9.0 (H)   Lab Results  Component Value Date   MICROALBUR 0.2 04/26/2012    Wt Readings from Last 3 Encounters:  05/10/13 285 lb 8 oz (129.502 kg)  05/03/13 285 lb 3.2 oz (129.366 kg)  10/09/12 287 lb 9.6 oz (130.455 kg)      Medication List       This list is accurate as of: 05/10/13  3:03 PM.  Always use your most recent med list.               allopurinol 100 MG tablet  Commonly known as:  ZYLOPRIM  Take 1 tablet (100 mg total) by mouth 2 (two) times daily as needed. For gout     atorvastatin 20 MG tablet  Commonly known as:  LIPITOR  Take 1 tablet (20 mg total) by mouth daily.     calcium carbonate 500 MG chewable tablet  Commonly known as:  TUMS - dosed in mg elemental calcium  Chew 2 tablets by mouth once.     fish oil-omega-3 fatty acids 1000 MG capsule  Take 1 g by mouth daily.     losartan  50 MG tablet  Commonly known as:  COZAAR  TAKE 1 TABLET DAILY     metFORMIN 500 MG 24 hr tablet  Commonly known as:  GLUCOPHAGE-XR  2 tablets daily with evening meal. Labs are due now     NOVOTWIST 32G X 5 MM Misc  Generic drug:  Insulin Pen Needle  USE AS DIRECTED     ONE TOUCH ULTRA TEST test strip  Generic drug:  glucose blood  TEST BLOOD SUGAR TWICE DAILY AS DIRECTED     onetouch ultrasoft lancets  1 each by Other route as needed. Use as instructed     VICTOZA 18 MG/3ML Sopn  Generic drug:  Liraglutide  INJECT 1 .8 MILLIGRAMS SUBCUTANEOUSLY EVERY DAY        Allergies:  Allergies  Allergen Reactions  . Penicillins Anaphylaxis    Past Medical History  Diagnosis Date  . Allergy   . Hypertension   . Hyperlipidemia   . Gout   . Umbilical hernia   . Diabetes mellitus     Past Surgical History  Procedure Laterality Date  . Appendectomy      1980s    No family history on  file.  Social History:  reports that he has been smoking Cigarettes and Cigars.  He has been smoking about 0.00 packs per day. He has never used smokeless tobacco. He reports that  drinks alcohol. He reports that he does not use illicit drugs.    Review of Systems      Hyperlipidemia: He has marked hyperlipidemia and has recently started taking Lipitor after his lab work last month      No unusual headaches.              Skin: No rash or infections     Thyroid:  No unusual fatigue.     The blood pressure has been controlled with losartan low dose     No swelling of feet.     No shortness of breath on exertion.     Bowel habits:  No change.                   Rectal bleeding/black stools: Not present.       No frequency of urination, nocturia, weak stream.      No joint  pains. Has history of hyperuricemia. However he has had low back pain off and on including recently      He has mild burning sensation in his feet especially in the evenings f but is able to sleep   No visits with results within 1 Week(s) from this visit. Latest known visit with results is:  Lab on 04/03/2013  Component Date Value Range Status  . Cholesterol 04/03/2013 260* 0 - 200 mg/dL Final   ATP III Classification       Desirable:  < 200 mg/dL               Borderline High:  200 - 239 mg/dL          High:  > = 147 mg/dL  . Triglycerides 04/03/2013 208.0* 0.0 - 149.0 mg/dL Final   Normal:  <829 mg/dLBorderline High:  150 - 199 mg/dL  . HDL 04/03/2013 29.30* >39.00 mg/dL Final  . VLDL 56/21/3086 41.6* 0.0 - 40.0 mg/dL Final  . Total CHOL/HDL Ratio 04/03/2013 9   Final                  Men  Women1/2 Average Risk     3.4          3.3Average Risk          5.0          4.42X Average Risk          9.6          7.13X Average Risk          15.0          11.0                      . Total Bilirubin 04/03/2013 0.7  0.3 - 1.2 mg/dL Final  . Bilirubin, Direct 04/03/2013 0.0  0.0 - 0.3 mg/dL Final  .  Alkaline Phosphatase 04/03/2013 61  39 - 117 U/L Final  . AST 04/03/2013 29  0 - 37 U/L Final  . ALT 04/03/2013 45  0 - 53 U/L Final  . Total Protein 04/03/2013 7.5  6.0 - 8.3 g/dL Final  . Albumin 62/13/0865 4.3  3.5 - 5.2 g/dL Final  . Sodium 78/46/9629 135  135 - 145 mEq/L Final  . Potassium 04/03/2013 4.0  3.5 - 5.1 mEq/L Final  . Chloride 04/03/2013 103  96 - 112 mEq/L Final  . CO2 04/03/2013 25  19 - 32 mEq/L Final  . Glucose, Bld 04/03/2013 158* 70 - 99 mg/dL Final  . BUN 52/84/1324 15  6 - 23 mg/dL Final  . Creatinine, Ser 04/03/2013 1.0  0.4 - 1.5 mg/dL Final  . Calcium 40/07/2724 9.7  8.4 - 10.5 mg/dL Final  . GFR 36/64/4034 86.28  >60.00 mL/min Final  . Hemoglobin A1C 04/03/2013 9.0* 4.6 - 6.5 % Final   Glycemic Control Guidelines for People with Diabetes:Non Diabetic:  <6%Goal of Therapy: <7%Additional Action Suggested:  >8%   . Direct LDL 04/03/2013 183.0   Final   Optimal:  <100 mg/dLNear or Above Optimal:  100-129 mg/dLBorderline High:  130-159 mg/dLHigh:  160-189 mg/dLVery High:  >190 mg/dL    Physical Examination:  BP 116/70  Pulse 94  Temp(Src) 98 F (36.7 C)  Resp 12  Ht 5\' 9"  (1.753 m)  Wt 285 lb 8 oz (129.502 kg)  BMI 42.14 kg/m2  SpO2 96%  GENERAL:         Patient has generalized obesity.   HEENT:         Eye exam shows normal external appearance. Fundus exam shows no retinopathy. Oral exam shows normal mucosa .  NECK:         General:  Neck exam shows no lymphadenopathy. Carotids are normal to palpation and no bruit heard. Thyroid is not enlarged and no nodules felt.   LUNGS:         Chest is symmetrical. Lungs are clear to auscultation.Marland Kitchen   HEART:         Heart sounds:  S1 and S2 are normal. No murmurs or clicks heard., no S3 or S4.   ABDOMEN:     There is no distention present. Umbilical hernia present Liver and spleen are not palpable. No other mass or tenderness present.  EXTREMITIES:     There is no edema. No skin lesions or abnormal pigmentation  present.Marland Kitchen  NEUROLOGICAL:        Vibration sense is mildly reduced in toes. Ankle jerks are absent bilaterally.         Diabetic foot exam:            Inspection  Normal                    Monofilament  Normal  MUSCULOSKELETAL:       There is no enlargement or deformity of the joints. Spine is normal to inspection.Marland Kitchen   PEDAL pulses: Normal SKIN:       No rash or lesions of concern.        ASSESSMENT/PLAN: :  Diabetes type 2, uncontrolled - 250.02    Patient has progressively worsening control of his diabetes this year despite using metformin and Victoza. However he has been taking only at maximum doses of metformin Also has not been watching his diet consistently until recently. He thinks he has lost 15 pounds with modification of his diet He has been trying to exercise intermittently but is limited because of back pain  Blood sugars are significant higher with his stopping Victoza and discussed that this should still be beneficial in the long run He may benefit significantly from adding an SGLT 2 drug and discussed how Invokana works, benefits, side effects and dosage Will start with 100 mg and then increased to 300 mg per day. Caution him against possibly getting lower blood pressure with this and also potential for balanitis He will increase metformin to 2000 mg and restart Victoza Discussed that if his sugars are not significantly better in 3 weeks we will add insulin Also need to check more postprandial readings which he has not been Given a new One Touch Verio Meter  Complications: Minimal neuropathy with mild symptoms and no significant objective signs. Has not had recent microalbumin and will need to check this  HYPERLIPIDEMIA: He has marked hypercholesterolemia, recently started on medications. Will need followup levels  ? Hypertension: He has had fairly good blood pressure levels with only low-dose losartan and no history of proteinuria  Lilya Smitherman 05/10/2013, 3:03 PM

## 2013-05-10 NOTE — Patient Instructions (Addendum)
Invokana 100mg  in am for 5 days and then 300mg   METFORMIN ER 3 tablets daily for the next 5 days and then 4 tablets  Restart Victoza, 0.6 mg for 2 days, 1.2 mg for another 2 days and then 1.8 mg daily Rotate the sites of the injections on both sides  Please check blood sugars at least half the time about 2 hours after any meal and as directed on waking up. Please bring blood sugar monitor to each visit

## 2013-05-14 ENCOUNTER — Other Ambulatory Visit: Payer: Self-pay | Admitting: *Deleted

## 2013-05-14 MED ORDER — GLUCOSE BLOOD VI STRP: ORAL_STRIP | Status: DC

## 2013-05-21 ENCOUNTER — Telehealth: Payer: Self-pay | Admitting: Endocrinology

## 2013-05-21 MED ORDER — CANAGLIFLOZIN 300 MG PO TABS: 300.0000 mg | ORAL_TABLET | Freq: Every day | ORAL | Status: DC

## 2013-05-21 NOTE — Telephone Encounter (Signed)
rx sent

## 2013-05-25 ENCOUNTER — Other Ambulatory Visit (INDEPENDENT_AMBULATORY_CARE_PROVIDER_SITE_OTHER): Payer: BC Managed Care – PPO

## 2013-05-25 DIAGNOSIS — E785 Hyperlipidemia, unspecified: Secondary | ICD-10-CM

## 2013-05-25 DIAGNOSIS — IMO0001 Reserved for inherently not codable concepts without codable children: Secondary | ICD-10-CM

## 2013-05-25 LAB — COMPREHENSIVE METABOLIC PANEL
AST: 30 U/L (ref 0–37)
Albumin: 4.7 g/dL (ref 3.5–5.2)
Alkaline Phosphatase: 55 U/L (ref 39–117)
Calcium: 9.9 mg/dL (ref 8.4–10.5)
Chloride: 106 mEq/L (ref 96–112)
Glucose, Bld: 99 mg/dL (ref 70–99)
Potassium: 3.8 mEq/L (ref 3.5–5.1)
Sodium: 141 mEq/L (ref 135–145)
Total Protein: 8 g/dL (ref 6.0–8.3)

## 2013-05-25 LAB — LIPID PANEL
Cholesterol: 133 mg/dL (ref 0–200)
LDL Cholesterol: 83 mg/dL (ref 0–99)
Triglycerides: 106 mg/dL (ref 0.0–149.0)

## 2013-05-25 LAB — URINALYSIS, ROUTINE W REFLEX MICROSCOPIC
Bilirubin Urine: NEGATIVE
Hgb urine dipstick: NEGATIVE
Ketones, ur: NEGATIVE
Nitrite: NEGATIVE
Total Protein, Urine: NEGATIVE
Urine Glucose: >=1000
pH: 5.5 (ref 5.0–8.0)

## 2013-05-25 LAB — MICROALBUMIN / CREATININE URINE RATIO
Creatinine,U: 74.4 mg/dL
Microalb, Ur: 0.3 mg/dL (ref 0.0–1.9)

## 2013-05-26 LAB — FRUCTOSAMINE: Fructosamine: 297 umol/L — ABNORMAL HIGH

## 2013-05-31 ENCOUNTER — Ambulatory Visit (INDEPENDENT_AMBULATORY_CARE_PROVIDER_SITE_OTHER): Payer: BC Managed Care – PPO | Admitting: Endocrinology

## 2013-05-31 ENCOUNTER — Encounter: Payer: Self-pay | Admitting: Endocrinology

## 2013-05-31 VITALS — BP 126/80 | HR 90 | Temp 97.7°F | Resp 12 | Ht 70.0 in | Wt 278.0 lb

## 2013-05-31 DIAGNOSIS — E785 Hyperlipidemia, unspecified: Secondary | ICD-10-CM

## 2013-05-31 NOTE — Progress Notes (Signed)
Patient ID: Greg Kane, male   DOB: March 28, 1966, 47 y.o.   MRN: [id number]    Reason for Appointment : for Type 2 Diabetes  History of Present Illness          4 metformin abg distress Less tired Diagnosis: Type 2 diabetes mellitus, date of diagnosis 2008  Background information: Initial diagnosis was made incidentally and had only mild hyperglycemia. Initially was treated with metformin alone and then Actos was added He has not had any formal diabetes education and has had difficulty losing weight His blood sugars have been only fairly well controlled with A1c usually in the 7-8 range Because of inadequate control in 2013 he was given Victoza injection in addition. However Actos was stopped  He thinks this helped bring his blood sugars better controlled but helped his portion control only slightly He did not lose much weight with Victoza  RECENT history Because of high blood sugars since 6/14 he was given Invokana in addition and his Victoza was resumed. He was tried on 2 g metformin but discussed abdominal distress and he is taking only 3 His blood sugars are significantly better and he feels less tired He has been motivated to lose weight and continues to lose weight now especially with watching his diet consistently He has started paying attention to his portions, carbohydrate intake and trying to use a smoothie with greek yogurt in the morning  Glucose monitoring: Using One Touch meter once a day.     Blood Glucose readings from meter : readings before breakfast: Recently 155-192 previously as high as 294, afternoon not checked recently, today 124, previously up to 302          Physical activity: Has been doing  exercise  Less (back pain)  Component     Latest Ref Rng 08/12/2008 02/19/2009 06/02/2009 03/09/2010 02/17/2011  Hemoglobin A1C     4.6 - 6.5 % 6.9 (H) 7.1 (H) 7.0 (H) 7.4 (H) 8.0 (H)   Component     Latest Ref Rng 06/14/2011 04/26/2012 04/03/2013  Hemoglobin A1C     4.6 -  6.5 % 7.4 (H) 8.1 (H) 9.0 (H)   Lab Results  Component Value Date   MICROALBUR 0.3 05/25/2013    Wt Readings from Last 3 Encounters:  05/31/13 278 lb (126.1 kg)  05/10/13 285 lb 8 oz (129.502 kg)  05/03/13 285 lb 3.2 oz (129.366 kg)      Medication List       This list is accurate as of: 05/31/13  4:34 PM.  Always use your most recent med list.               allopurinol 100 MG tablet  Commonly known as:  ZYLOPRIM  Take 1 tablet (100 mg total) by mouth 2 (two) times daily as needed. For gout     atorvastatin 20 MG tablet  Commonly known as:  LIPITOR  Take 1 tablet (20 mg total) by mouth daily.     calcium carbonate 500 MG chewable tablet  Commonly known as:  TUMS - dosed in mg elemental calcium  Chew 2 tablets by mouth once.     Canagliflozin 300 MG Tabs  Commonly known as:  INVOKANA  Take 1 tablet (300 mg total) by mouth daily.     fish oil-omega-3 fatty acids 1000 MG capsule  Take 1 g by mouth daily.     glucose blood test strip  Commonly known as:  ONETOUCH VERIO  Use as instructed to  check blood sugars twice a day     losartan 50 MG tablet  Commonly known as:  COZAAR  TAKE 1 TABLET DAILY     metFORMIN 500 MG 24 hr tablet  Commonly known as:  GLUCOPHAGE-XR  2 tablets daily with evening meal. Labs are due now     NOVOTWIST 32G X 5 MM Misc  Generic drug:  Insulin Pen Needle  USE AS DIRECTED     onetouch ultrasoft lancets  1 each by Other route as needed. Use as instructed     VICTOZA 18 MG/3ML Sopn  Generic drug:  Liraglutide  INJECT 1 .8 MILLIGRAMS SUBCUTANEOUSLY EVERY DAY        Allergies:  Allergies  Allergen Reactions  . Penicillins Anaphylaxis    Past Medical History  Diagnosis Date  . Allergy   . Hypertension   . Hyperlipidemia   . Gout   . Umbilical hernia   . Diabetes mellitus     Past Surgical History  Procedure Laterality Date  . Appendectomy      1980s    No family history on file.  Social History:  reports that he  has been smoking Cigarettes and Cigars.  He has been smoking about 0.00 packs per day. He has never used smokeless tobacco. He reports that  drinks alcohol. He reports that he does not use illicit drugs.    Review of Systems      Hyperlipidemia: He has marked hyperlipidemia and has been taking Lipitor now, LDL improved at 89      He has mild burning sensation in his feet especially in the evenings  but is able to sleep  ? Hypertension: He has had fairly good blood pressure levels with only low-dose losartan and no history of proteinuria   Appointment on 05/25/2013  Component Date Value Range Status  . Color, Urine 05/25/2013 LT. YELLOW  Yellow;Lt. Yellow Final  . APPearance 05/25/2013 CLEAR  Clear Final  . Specific Gravity, Urine 05/25/2013 1.020  1.000-1.030 Final  . pH 05/25/2013 5.5  5.0 - 8.0 Final  . Total Protein, Urine 05/25/2013 NEGATIVE  Negative Final  . Urine Glucose 05/25/2013 >=1000  Negative Final  . Ketones, ur 05/25/2013 NEGATIVE  Negative Final  . Bilirubin Urine 05/25/2013 NEGATIVE  Negative Final  . Hgb urine dipstick 05/25/2013 NEGATIVE  Negative Final  . Urobilinogen, UA 05/25/2013 0.2  0.0 - 1.0 Final  . Leukocytes, UA 05/25/2013 NEGATIVE  Negative Final  . Nitrite 05/25/2013 NEGATIVE  Negative Final  . WBC, UA 05/25/2013 none seen  0-2/hpf Final  . RBC / HPF 05/25/2013 none seen  0-2/hpf Final  . Squamous Epithelial / LPF 05/25/2013 Rare(0-4/hpf)  Rare(0-4/hpf) Final  . Microalb, Ur 05/25/2013 0.3  0.0 - 1.9 mg/dL Final  . Creatinine,U 16/07/9603 74.4   Final  . Microalb Creat Ratio 05/25/2013 0.4  0.0 - 30.0 mg/g Final  . Cholesterol 05/25/2013 133  0 - 200 mg/dL Final   ATP III Classification       Desirable:  < 200 mg/dL               Borderline High:  200 - 239 mg/dL          High:  > = 540 mg/dL  . Triglycerides 05/25/2013 106.0  0.0 - 149.0 mg/dL Final   Normal:  <981 mg/dLBorderline High:  150 - 199 mg/dL  . HDL 05/25/2013 29.00* >39.00 mg/dL Final   . VLDL 19/14/7829 21.2  0.0 - 40.0 mg/dL  Final  . LDL Cholesterol 05/25/2013 83  0 - 99 mg/dL Final  . Total CHOL/HDL Ratio 05/25/2013 5   Final                  Men          Women1/2 Average Risk     3.4          3.3Average Risk          5.0          4.42X Average Risk          9.6          7.13X Average Risk          15.0          11.0                      . Fructosamine 05/25/2013 297* <285 umol/L Final   Comment:                            Variations in levels of serum proteins (albumin and immunoglobulins)                          may affect fructosamine results.                             . Sodium 05/25/2013 141  135 - 145 mEq/L Final  . Potassium 05/25/2013 3.8  3.5 - 5.1 mEq/L Final  . Chloride 05/25/2013 106  96 - 112 mEq/L Final  . CO2 05/25/2013 25  19 - 32 mEq/L Final  . Glucose, Bld 05/25/2013 99  70 - 99 mg/dL Final  . BUN 95/62/1308 16  6 - 23 mg/dL Final  . Creatinine, Ser 05/25/2013 1.0  0.4 - 1.5 mg/dL Final  . Total Bilirubin 05/25/2013 0.9  0.3 - 1.2 mg/dL Final  . Alkaline Phosphatase 05/25/2013 55  39 - 117 U/L Final  . AST 05/25/2013 30  0 - 37 U/L Final  . ALT 05/25/2013 47  0 - 53 U/L Final  . Total Protein 05/25/2013 8.0  6.0 - 8.3 g/dL Final  . Albumin 65/78/4696 4.7  3.5 - 5.2 g/dL Final  . Calcium 29/52/8413 9.9  8.4 - 10.5 mg/dL Final  . GFR 24/40/1027 86.23  >60.00 mL/min Final    Physical Examination:  BP 126/80  Pulse 90  Temp(Src) 97.7 F (36.5 C)  Resp 12  Ht 5\' 10"  (1.778 m)  Wt 278 lb (126.1 kg)  BMI 39.89 kg/m2  SpO2 96%  GENERAL:         Patient has generalized obesity.    ASSESSMENT/PLAN: :  Diabetes type 2, uncontrolled - 250.02    His blood sugars are significantly better with adding Invokana which he is tolerating. He is also taking Victoza and 1500 mg metformin His fructosamine is nearly normal indicating overall improve control He is not checking enough readings after meals and fasting glucose readings are still relatively  high Since he generally has high readings fasting anyway we will continue same regimen for now and check his A1c in about 2 months Encouraged him to start exercising when he can especially with machine slight recumbent bike    [facility] 05/31/2013, 4:34 PM

## 2013-05-31 NOTE — Patient Instructions (Addendum)
Exercise more  Please check blood sugars at least half the time about 2 hours after any meal and as directed on waking up. Please bring blood sugar monitor to each visit

## 2013-06-11 ENCOUNTER — Ambulatory Visit (INDEPENDENT_AMBULATORY_CARE_PROVIDER_SITE_OTHER): Payer: BC Managed Care – PPO | Admitting: Family Medicine

## 2013-06-11 ENCOUNTER — Encounter: Payer: Self-pay | Admitting: Family Medicine

## 2013-06-11 VITALS — BP 118/88 | HR 94 | Temp 98.2°F | Ht 70.0 in | Wt 280.2 lb

## 2013-06-11 DIAGNOSIS — Z23 Encounter for immunization: Secondary | ICD-10-CM

## 2013-06-11 DIAGNOSIS — M109 Gout, unspecified: Secondary | ICD-10-CM

## 2013-06-11 DIAGNOSIS — Z0000 Encounter for general adult medical examination without abnormal findings: Secondary | ICD-10-CM

## 2013-06-11 DIAGNOSIS — I10 Essential (primary) hypertension: Secondary | ICD-10-CM

## 2013-06-11 DIAGNOSIS — E119 Type 2 diabetes mellitus without complications: Secondary | ICD-10-CM

## 2013-06-11 DIAGNOSIS — E785 Hyperlipidemia, unspecified: Secondary | ICD-10-CM

## 2013-06-11 DIAGNOSIS — E669 Obesity, unspecified: Secondary | ICD-10-CM | POA: Insufficient documentation

## 2013-06-11 LAB — CBC WITH DIFFERENTIAL/PLATELET
Basophils Relative: 0.4 % (ref 0.0–3.0)
Eosinophils Relative: 1.9 % (ref 0.0–5.0)
HCT: 48.9 % (ref 39.0–52.0)
MCV: 88.8 fl (ref 78.0–100.0)
Monocytes Relative: 6.4 % (ref 3.0–12.0)
Neutrophils Relative %: 65.7 % (ref 43.0–77.0)
Platelets: 214 10*3/uL (ref 150.0–400.0)
RBC: 5.5 Mil/uL (ref 4.22–5.81)
WBC: 11.1 10*3/uL — ABNORMAL HIGH (ref 4.5–10.5)

## 2013-06-11 LAB — POCT URINALYSIS DIPSTICK
Bilirubin, UA: NEGATIVE
Ketones, UA: NEGATIVE
Leukocytes, UA: NEGATIVE
Nitrite, UA: NEGATIVE

## 2013-06-11 LAB — PSA: PSA: 1.32 ng/mL (ref 0.10–4.00)

## 2013-06-11 LAB — URIC ACID: Uric Acid, Serum: 5.8 mg/dL (ref 4.0–7.8)

## 2013-06-11 MED ORDER — LOSARTAN POTASSIUM 50 MG PO TABS: ORAL_TABLET | ORAL | Status: DC

## 2013-06-11 NOTE — Assessment & Plan Note (Signed)
Check uric acid. 

## 2013-06-11 NOTE — Patient Instructions (Addendum)
Preventive Care for Adults, Male  A healthy lifestyle and preventive care can promote health and wellness. Preventive health guidelines for men include the following key practices:  · A routine yearly physical is a good way to check with your caregiver about your health and preventative screening. It is a chance to share any concerns and updates on your health, and to receive a thorough exam.  · Visit your dentist for a routine exam and preventative care every 6 months. Brush your teeth twice a day and floss once a day. Good oral hygiene prevents tooth decay and gum disease.  · The frequency of eye exams is based on your age, health, family medical history, use of contact lenses, and other factors. Follow your caregiver's recommendations for frequency of eye exams.  · Eat a healthy diet. Foods like vegetables, fruits, whole grains, low-fat dairy products, and lean protein foods contain the nutrients you need without too many calories. Decrease your intake of foods high in solid fats, added sugars, and salt. Eat the right amount of calories for you. Get information about a proper diet from your caregiver, if necessary.  · Regular physical exercise is one of the most important things you can do for your health. Most adults should get at least 150 minutes of moderate-intensity exercise (any activity that increases your heart rate and causes you to sweat) each week. In addition, most adults need muscle-strengthening exercises on 2 or more days a week.  · Maintain a healthy weight. The body mass index (BMI) is a screening tool to identify possible weight problems. It provides an estimate of body fat based on height and weight. Your caregiver can help determine your BMI, and can help you achieve or maintain a healthy weight. For adults 20 years and older:  · A BMI below 18.5 is considered underweight.  · A BMI of 18.5 to 24.9 is normal.  · A BMI of 25 to 29.9 is considered overweight.  · A BMI of 30 and above is  considered obese.  · Maintain normal blood lipids and cholesterol levels by exercising and minimizing your intake of saturated fat. Eat a balanced diet with plenty of fruit and vegetables. Blood tests for lipids and cholesterol should begin at age 20 and be repeated every 5 years. If your lipid or cholesterol levels are high, you are over 50, or you are a high risk for heart disease, you may need your cholesterol levels checked more frequently. Ongoing high lipid and cholesterol levels should be treated with medicines if diet and exercise are not effective.  · If you smoke, find out from your caregiver how to quit. If you do not use tobacco, do not start.  · If you choose to drink alcohol, do not exceed 2 drinks per day. One drink is considered to be 12 ounces (355 mL) of beer, 5 ounces (148 mL) of wine, or 1.5 ounces (44 mL) of liquor.  · Avoid use of street drugs. Do not share needles with anyone. Ask for help if you need support or instructions about stopping the use of drugs.  · High blood pressure causes heart disease and increases the risk of stroke. Your blood pressure should be checked at least every 1 to 2 years. Ongoing high blood pressure should be treated with medicines, if weight loss and exercise are not effective.  · If you are 45 to 49 years old, ask your caregiver if you should take aspirin to prevent heart disease.  · Diabetes screening involves taking   a blood sample to check your fasting blood sugar level. This should be done once every 3 years, after age 45, if you are within normal weight and without risk factors for diabetes. Testing should be considered at a younger age or be carried out more frequently if you are overweight and have at least 1 risk factor for diabetes.  · Colorectal cancer can be detected and often prevented. Most routine colorectal cancer screening begins at the age of 50 and continues through age 75. However, your caregiver may recommend screening at an earlier age if you  have risk factors for colon cancer. On a yearly basis, your caregiver may provide home test kits to check for hidden blood in the stool. Use of a small camera at the end of a tube, to directly examine the colon (sigmoidoscopy or colonoscopy), can detect the earliest forms of colorectal cancer. Talk to your caregiver about this at age 50, when routine screening begins.  Direct examination of the colon should be repeated every 5 to 10 years through age 75, unless early forms of pre-cancerous polyps or small growths are found.  · Hepatitis C blood testing is recommended for all people born from 1945 through 1965 and any individual with known risks for hepatitis C.  · Practice safe sex. Use condoms and avoid high-risk sexual practices to reduce the spread of sexually transmitted infections (STIs). STIs include gonorrhea, chlamydia, syphilis, trichomonas, herpes, HPV, and human immunodeficiency virus (HIV). Herpes, HIV, and HPV are viral illnesses that have no cure. They can result in disability, cancer, and death.  · A one-time screening for abdominal aortic aneurysm (AAA) and surgical repair of large AAAs by sound wave imaging (ultrasonography) is recommended for ages 65 to 75 years who are current or former smokers.  · Healthy men should no longer receive prostate-specific antigen (PSA) blood tests as part of routine cancer screening. Consult with your caregiver about prostate cancer screening.  · Testicular cancer screening is not recommended for adult males who have no symptoms. Screening includes self-exam, caregiver exam, and other screening tests. Consult with your caregiver about any symptoms you have or any concerns you have about testicular cancer.  · Use sunscreen with skin protection factor (SPF) of 30 or more. Apply sunscreen liberally and repeatedly throughout the day. You should seek shade when your shadow is shorter than you. Protect yourself by wearing long sleeves, pants, a wide-brimmed hat, and  sunglasses year round, whenever you are outdoors.  · Once a month, do a whole body skin exam, using a mirror to look at the skin on your back. Notify your caregiver of new moles, moles that have irregular borders, moles that are larger than a pencil eraser, or moles that have changed in shape or color.  · Stay current with required immunizations.  · Influenza. You need a dose every fall (or winter). The composition of the flu vaccine changes each year, so being vaccinated once is not enough.  · Pneumococcal polysaccharide. You need 1 to 2 doses if you smoke cigarettes or if you have certain chronic medical conditions. You need 1 dose at age 65 (or older) if you have never been vaccinated.  · Tetanus, diphtheria, pertussis (Tdap, Td). Get 1 dose of Tdap vaccine if you are younger than age 65 years, are over 65 and have contact with an infant, are a healthcare worker, or simply want to be protected from whooping cough. After that, you need a Td booster dose every 10 years. Consult your   caregiver if you have not had at least 3 tetanus and diphtheria-containing shots sometime in your life or have a deep or dirty wound.  · HPV. This vaccine is recommended for males 13 through 49 years of age. This vaccine may be given to men 22 through 49 years of age who have not completed the 3 dose series. It is recommended for men through age 26 who have sex with men or whose immune system is weakened because of HIV infection, other illness, or medications. The vaccine is given in 3 doses over 6 months.  · Measles, mumps, rubella (MMR). You need at least 1 dose of MMR if you were born in 1957 or later. You may also need a 2nd dose.  · Meningococcal. If you are age 19 to 21 years and a first-year college student living in a residence hall, or have one of several medical conditions, you need to get vaccinated against meningococcal disease. You may also need additional booster doses.  · Zoster (shingles). If you are age 60 years or  older, you should get this vaccine.  · Varicella (chickenpox). If you have never had chickenpox or you were vaccinated but received only 1 dose, talk to your caregiver to find out if you need this vaccine.  · Hepatitis A. You need this vaccine if you have a specific risk factor for hepatitis A virus infection, or you simply wish to be protected from this disease. The vaccine is usually given as 2 doses, 6 to 18 months apart.  · Hepatitis B. You need this vaccine if you have a specific risk factor for hepatitis B virus infection or you simply wish to be protected from this disease. The vaccine is given in 3 doses, usually over 6 months.  Preventative Service / Frequency  Ages 19 to 39  · Blood pressure check.** / Every 1 to 2 years.  · Lipid and cholesterol check.** / Every 5 years beginning at age 20.  · Hepatitis C blood test.** / For any individual with known risks for hepatitis C.  · Skin self-exam. / Monthly.  · Influenza immunization.** / Every year.  · Pneumococcal polysaccharide immunization.** / 1 to 2 doses if you smoke cigarettes or if you have certain chronic medical conditions.  · Tetanus, diphtheria, pertussis (Tdap,Td) immunization. / A one-time dose of Tdap vaccine. After that, you need a Td booster dose every 10 years.  · HPV immunization. / 3 doses over 6 months, if 26 and younger.  · Measles, mumps, rubella (MMR) immunization. / You need at least 1 dose of MMR if you were born in 1957 or later. You may also need a 2nd dose.  · Meningococcal immunization. / 1 dose if you are age 19 to 21 years and a first-year college student living in a residence hall, or have one of several medical conditions, you need to get vaccinated against meningococcal disease. You may also need additional booster doses.  · Varicella immunization.** / Consult your caregiver.  · Hepatitis A immunization.** / Consult your caregiver. 2 doses, 6 to 18 months apart.  · Hepatitis B immunization.** / Consult your caregiver. 3 doses  usually over 6 months.  Ages 40 to 64  · Blood pressure check.** / Every 1 to 2 years.  · Lipid and cholesterol check.** / Every 5 years beginning at age 20.  · Fecal occult blood test (FOBT) of stool. / Every year beginning at age 50 and continuing until age 75. You may not have   to do this test if you get colonoscopy every 10 years.  · Flexible sigmoidoscopy** or colonoscopy.** / Every 5 years for a flexible sigmoidoscopy or every 10 years for a colonoscopy beginning at age 50 and continuing until age 75.  · Hepatitis C blood test.** / For all people born from 1945 through 1965 and any individual with known risks for hepatitis C.  · Skin self-exam. / Monthly.  · Influenza immunization.** / Every year.  · Pneumococcal polysaccharide immunization.** / 1 to 2 doses if you smoke cigarettes or if you have certain chronic medical conditions.  · Tetanus, diphtheria, pertussis (Tdap/Td) immunization.** / A one-time dose of Tdap vaccine. After that, you need a Td booster dose every 10 years.  · Measles, mumps, rubella (MMR) immunization.  / You need at least 1 dose of MMR if you were born in 1957 or later. You may also need a 2nd dose.  · Varicella immunization.**/ Consult your caregiver.  · Meningococcal immunization.** / Consult your caregiver.  · Hepatitis A immunization.** / Consult your caregiver. 2 doses, 6 to 18 months apart.  · Hepatitis B immunization.** / Consult your caregiver. 3 doses, usually over 6 months.  Ages 65 and over  · Blood pressure check.** / Every 1 to 2 years.  · Lipid and cholesterol check.**/ Every 5 years beginning at age 20.  · Fecal occult blood test (FOBT) of stool. / Every year beginning at age 50 and continuing until age 75. You may not have to do this test if you get colonoscopy every 10 years.  · Flexible sigmoidoscopy** or colonoscopy.** / Every 5 years for a flexible sigmoidoscopy or every 10 years for a colonoscopy beginning at age 50 and continuing until age 75.  · Hepatitis C blood  test.** / For all people born from 1945 through 1965 and any individual with known risks for hepatitis C.  · Abdominal aortic aneurysm (AAA) screening.** / A one-time screening for ages 65 to 75 years who are current or former smokers.  · Skin self-exam. / Monthly.  · Influenza immunization.** / Every year.  · Pneumococcal polysaccharide immunization.** / 1 dose at age 65 (or older) if you have never been vaccinated.  · Tetanus, diphtheria, pertussis (Tdap, Td) immunization. / A one-time dose of Tdap vaccine if you are over 65 and have contact with an infant, are a healthcare worker, or simply want to be protected from whooping cough. After that, you need a Td booster dose every 10 years.  · Varicella immunization. ** / Consult your caregiver.  · Meningococcal immunization.** / Consult your caregiver.  · Hepatitis A immunization. ** / Consult your caregiver. 2 doses, 6 to 18 months apart.  · Hepatitis B immunization.** / Check with your caregiver. 3 doses, usually over 6 months.  **Family history and personal history of risk and conditions may change your caregiver's recommendations.  Document Released: 11/16/2001 Document Revised: 12/13/2011 Document Reviewed: 02/15/2011  [employer]® Patient Information ©2014 [employer].

## 2013-06-11 NOTE — Assessment & Plan Note (Signed)
Labs checked per endo

## 2013-06-11 NOTE — Assessment & Plan Note (Signed)
con't meds 

## 2013-06-11 NOTE — Assessment & Plan Note (Signed)
Check labs con't meds 

## 2013-06-11 NOTE — Progress Notes (Signed)
Subjective:    Patient ID: Greg Kane, male    DOB: October 22, 1965, 47 y.o.   MRN: [id number]  HPI Pt here for cpe.  No complaints.     Review of Systems    Review of Systems  Constitutional: Negative for activity change, appetite change and fatigue.  HENT: Negative for hearing loss, congestion, tinnitus and ear discharge.  dentist q67m Eyes: Negative for visual disturbance (see optho q1y -- vision corrected to 20/20 with glasses).  Respiratory: Negative for cough, chest tightness and shortness of breath.   Cardiovascular: Negative for chest pain, palpitations and leg swelling.  Gastrointestinal: Negative for abdominal pain, diarrhea, constipation and abdominal distention.  Genitourinary: Negative for urgency, frequency, decreased urine volume and difficulty urinating.  Musculoskeletal: Negative for back pain, arthralgias and gait problem.  Skin: Negative for color change, pallor and rash.  Neurological: Negative for dizziness, light-headedness, numbness and headaches.  Hematological: Negative for adenopathy. Does not bruise/bleed easily.  Psychiatric/Behavioral: Negative for suicidal ideas, confusion, sleep disturbance, self-injury, dysphoric mood, decreased concentration and agitation.    Past Medical History  Diagnosis Date  . Allergy   . Hypertension   . Hyperlipidemia   . Gout   . Umbilical hernia   . Diabetes mellitus    Family History  Problem Relation Age of Onset  . Diabetes Mother   . Hyperlipidemia Father   . Hypertension Father   . Dementia Father    History   Social History  . Marital Status: Married    Spouse Name: N/A    Number of Children: N/A  . Years of Education: N/A   Occupational History  . d stone [employer]    Social History Main Topics  . Smoking status: Current Some Day Smoker    Types: Cigarettes, Cigars    Last Attempt to Quit: 10/12/2008  . Smokeless tobacco: Never Used     Comment: pt doesn't buy cig-- borrows from others no more  than 4 cig a week  . Alcohol Use: Yes     Comment: once a week   . Drug Use: No  . Sexual Activity: Yes   Other Topics Concern  . Not on file   Social History Narrative   Married   Children 3   Manufacturing engineer   Exercise -no         Current Outpatient Prescriptions on File Prior to Visit  Medication Sig Dispense Refill  . allopurinol (ZYLOPRIM) 100 MG tablet Take 1 tablet (100 mg total) by mouth 2 (two) times daily as needed. For gout  180 tablet  1  . atorvastatin (LIPITOR) 20 MG tablet Take 1 tablet (20 mg total) by mouth daily.  30 tablet  2  . Canagliflozin (INVOKANA) 300 MG TABS Take 1 tablet (300 mg total) by mouth daily.  30 tablet  5  . fish oil-omega-3 fatty acids 1000 MG capsule Take 1 g by mouth daily.        Marland Kitchen glucose blood (ONETOUCH VERIO) test strip Use as instructed to check blood sugars twice a day  100 each  12  . NOVOTWIST 32G X 5 MM MISC USE AS DIRECTED  100 each  1  . VICTOZA 18 MG/3ML SOPN INJECT 1 .8 MILLIGRAMS SUBCUTANEOUSLY EVERY DAY  9 mL  1   No current facility-administered medications on file prior to visit.     Objective:   Physical Exam  BP 118/88  Pulse 94  Temp(Src) 98.2 F (36.8 C) (Oral)  Ht 5\' 10"  (  1.778 m)  Wt 280 lb 3.2 oz (127.098 kg)  BMI 40.2 kg/m2  SpO2 97% General appearance: alert, cooperative, appears stated age and no distress Head: Normocephalic, without obvious abnormality, atraumatic Eyes: conjunctivae/corneas clear. PERRL, EOM's intact. Fundi benign. Ears: normal TM's and external ear canals both ears Nose: Nares normal. Septum midline. Mucosa normal. No drainage or sinus tenderness. Throat: lips, mucosa, and tongue normal; teeth and gums normal Neck: no adenopathy, no carotid bruit, no JVD, supple, symmetrical, trachea midline and thyroid not enlarged, symmetric, no tenderness/mass/nodules Back: symmetric, no curvature. ROM normal. No CVA tenderness. Lungs: clear to auscultation bilaterally Chest wall: no  tenderness Heart: regular rate and rhythm, S1, S2 normal, no murmur, click, rub or gallop Abdomen: soft, non-tender; bowel sounds normal; no masses,  no organomegaly Male genitalia: normal Rectal: normal tone, normal prostate, no masses or tenderness and soft brown guaiac negative stool noted Extremities: extremities normal, atraumatic, no cyanosis or edema Pulses: 2+ and symmetric Skin: Skin color, texture, turgor normal. No rashes or lesions Lymph nodes: Cervical, supraclavicular, and axillary nodes normal. Neurologic: Alert and oriented X 3, normal strength and tone. Normal symmetric reflexes. Normal coordination and gait Psych--  No anxiety, no depression       Assessment & Plan:  cpe-- check labs         ghm utd           See avs

## 2013-06-13 ENCOUNTER — Other Ambulatory Visit: Payer: Self-pay | Admitting: Family Medicine

## 2013-06-25 ENCOUNTER — Other Ambulatory Visit: Payer: Self-pay | Admitting: Family Medicine

## 2013-06-26 MED ORDER — METFORMIN HCL ER 500 MG PO TB24: 1500.0000 mg | ORAL_TABLET | Freq: Every day | ORAL | Status: DC

## 2013-06-26 NOTE — Telephone Encounter (Signed)
Called patient to confirm the Rx and he is requesting Metformin 500 mg. The directions came in a 2 daily by the pharmacy, Med list has 3 daily listed. Per patient he take 3 daily. [facility], he also requested a 30 day supply be sent to the local pharmacy. Rx sent to [facility]

## 2013-07-27 ENCOUNTER — Other Ambulatory Visit (INDEPENDENT_AMBULATORY_CARE_PROVIDER_SITE_OTHER): Payer: BC Managed Care – PPO

## 2013-07-27 LAB — BASIC METABOLIC PANEL
BUN: 15 mg/dL (ref 6–23)
Calcium: 9.9 mg/dL (ref 8.4–10.5)
Chloride: 101 mEq/L (ref 96–112)
Creatinine, Ser: 1 mg/dL (ref 0.4–1.5)
GFR: 81.36 mL/min (ref >60.00)
Glucose, Bld: 122 mg/dL — ABNORMAL HIGH (ref 70–99)
Potassium: 4 mEq/L (ref 3.5–5.1)

## 2013-07-27 LAB — HEMOGLOBIN A1C: Hgb A1c MFr Bld: 7.8 % — ABNORMAL HIGH (ref 4.6–6.5)

## 2013-08-02 ENCOUNTER — Encounter: Payer: Self-pay | Admitting: Endocrinology

## 2013-08-02 ENCOUNTER — Ambulatory Visit (INDEPENDENT_AMBULATORY_CARE_PROVIDER_SITE_OTHER): Payer: BC Managed Care – PPO | Admitting: Endocrinology

## 2013-08-02 VITALS — BP 124/72 | HR 86 | Temp 98.5°F | Resp 12 | Ht 70.0 in | Wt 281.4 lb

## 2013-08-02 MED ORDER — GABAPENTIN 300 MG PO CAPS: 300.0000 mg | ORAL_CAPSULE | Freq: Three times a day (TID) | ORAL | Status: DC

## 2013-08-02 MED ORDER — GLIMEPIRIDE 1 MG PO TABS: 1.0000 mg | ORAL_TABLET | Freq: Every day | ORAL | Status: DC

## 2013-08-02 NOTE — Patient Instructions (Addendum)
Glimeperide 1mg  at bedtime; if sugar still > 150, then use 2mg   Gabapentin for feet as needed up to 3 times a day

## 2013-08-02 NOTE — Progress Notes (Signed)
Patient ID: Greg Kane, male   DOB: 11/14/1965, 47 y.o.   MRN: [id number]   Reason for Appointment :  Follow up of Type 2 Diabetes  History of Present Illness          Diagnosis: Type 2 diabetes mellitus, date of diagnosis 2008  Background information: Initial diagnosis was made incidentally and had only mild hyperglycemia. Initially was treated with metformin alone and then Actos was added He has not had any formal diabetes education and has had difficulty losing weight His blood sugars have been only fairly well controlled with A1c usually in the 7-8 range Because of inadequate control in 2013 he was given Victoza injection in addition. However Actos was stopped  He thinks this helped bring his blood sugars better controlled but helped his portion control only slightly He did not lose much weight with Victoza In 6/14 he was given Invokana in addition and his Victoza was resumed.  RECENT history   Currently taking a regimen of Invokana, Victoza and metformin  He  cannot take  2 g metformin  because of  abdominal distress and he is taking only 3Tablets  His blood sugars are still relatively high in the mornings; he thinks these are usually high compared to evening readings However he has not done any readings after meals He was doing better on his diet on the last visit with reduced  carbohydrate intake and trying to use a smoothie with greek yogurt in the morning However on recent vacation he has not watched his diet at all  Glucose monitoring: Using One Touch meter once a day.     Blood Glucose readings from meter : readings before breakfast: 155-195, median 175, afternoon 190     Physical activity: Has been doing  exercise  Less (back pain)  Lab Results  Component Value Date   HGBA1C 7.8* 07/27/2013   HGBA1C 9.0* 04/03/2013   HGBA1C 8.1* 04/26/2012   Lab Results  Component Value Date   MICROALBUR 0.3 05/25/2013   LDLCALC 83 05/25/2013   CREATININE 1.0 07/27/2013     Lab  Results  Component Value Date   MICROALBUR 0.3 05/25/2013    Wt Readings from Last 3 Encounters:  08/02/13 281 lb 6.4 oz (127.642 kg)  06/11/13 280 lb 3.2 oz (127.098 kg)  05/31/13 278 lb (126.1 kg)      Medication List       This list is accurate as of: 08/02/13  4:32 PM.  Always use your most recent med list.               allopurinol 100 MG tablet  Commonly known as:  ZYLOPRIM  Take 1 tablet (100 mg total) by mouth 2 (two) times daily as needed. For gout     atorvastatin 20 MG tablet  Commonly known as:  LIPITOR  Take 1 tablet (20 mg total) by mouth daily.     Canagliflozin 300 MG Tabs  Commonly known as:  INVOKANA  Take 1 tablet (300 mg total) by mouth daily.     fish oil-omega-3 fatty acids 1000 MG capsule  Take 1 g by mouth daily.     glucose blood test strip  Commonly known as:  ONETOUCH VERIO  Use as instructed to check blood sugars twice a day     losartan 50 MG tablet  Commonly known as:  COZAAR  TAKE 1 TABLET DAILY     metFORMIN 500 MG 24 hr tablet  Commonly known as:  GLUCOPHAGE-XR  Take 3 tablets (1,500 mg total) by mouth daily with breakfast.     NOVOTWIST 32G X 5 MM Misc  Generic drug:  Insulin Pen Needle  USE AS DIRECTED     VICTOZA 18 MG/3ML Sopn  Generic drug:  Liraglutide  INJECT 1.8 MG SUBCUTANEOUSLY EVERY DAY        Allergies:  Allergies  Allergen Reactions  . Penicillins Anaphylaxis  . Influenza Virus Vaccine Split     cellulitis  . Pneumococcal Vaccines Other (See Comments)    Cellulitis, swelling    Past Medical History  Diagnosis Date  . Allergy   . Hypertension   . Hyperlipidemia   . Gout   . Umbilical hernia   . Diabetes mellitus     Past Surgical History  Procedure Laterality Date  . Appendectomy      1980s    Family History  Problem Relation Age of Onset  . Diabetes Mother   . Hyperlipidemia Father   . Hypertension Father   . Dementia Father     Social History:  reports that he has been smoking  Cigarettes and Cigars.  He has been smoking about 0.00 packs per day. He has never used smokeless tobacco. He reports that he drinks alcohol. He reports that he does not use illicit drugs.    Review of Systems      Hyperlipidemia: He has marked hyperlipidemia and has been taking Lipitor , LDL controlled at 89      He has painful burning sensation in his feet especially in the evenings      Hypertension: He has had fairly good blood pressure levels with only low-dose losartan and no history of proteinuria   Appointment on 07/27/2013  Component Date Value Range Status  . Sodium 07/27/2013 136  135 - 145 mEq/L Final  . Potassium 07/27/2013 4.0  3.5 - 5.1 mEq/L Final  . Chloride 07/27/2013 101  96 - 112 mEq/L Final  . CO2 07/27/2013 27  19 - 32 mEq/L Final  . Glucose, Bld 07/27/2013 122* 70 - 99 mg/dL Final  . BUN 10/12/3233 15  6 - 23 mg/dL Final  . Creatinine, Ser 07/27/2013 1.0  0.4 - 1.5 mg/dL Final  . Calcium 57/32/2025 9.9  8.4 - 10.5 mg/dL Final  . GFR 42/70/6237 81.36  >60.00 mL/min Final  . Hemoglobin A1C 07/27/2013 7.8* 4.6 - 6.5 % Final   Glycemic Control Guidelines for People with Diabetes:Non Diabetic:  <6%Goal of Therapy: <7%Additional Action Suggested:  >8%     Physical Examination:  BP 124/72  Pulse 86  Temp(Src) 98.5 F (36.9 C)  Resp 12  Ht 5\' 10"  (1.778 m)  Wt 281 lb 6.4 oz (127.642 kg)  BMI 40.38 kg/m2  SpO2 95%  GENERAL:         Patient has generalized obesity.  No pedal edema   ASSESSMENT/PLAN: :  Diabetes type 2, uncontrolled   His blood sugars are still high in the morning with average about 175. Although he probably has high readings in the morning than any other time he still has a relatively high A1c Of 7.8  He is now taking Victoza and Invokana in  maximum doses and and 1500 mg metformin Most likely does not have significant postprandial hyperglycemia but discussed needing to check more readings after supper at least to help with the  management For now will give him a trial of Amaryl 1 mg at bedtime which could be increased to 2 mg He  will try to be consistent with diet Again  Have encouraged him to exercise when he can  NEUROPATHY: He is having paresthesiae in his feet more recently and most likely this is from  diabetic neuropathy He will empirically try gabapentin 300 mg as needed  Hypertension: Well controlled   Dejour Vos 08/02/2013, 4:32 PM

## 2013-08-09 ENCOUNTER — Other Ambulatory Visit: Payer: Self-pay

## 2013-09-07 ENCOUNTER — Other Ambulatory Visit: Payer: Self-pay | Admitting: Family Medicine

## 2013-10-02 ENCOUNTER — Other Ambulatory Visit (INDEPENDENT_AMBULATORY_CARE_PROVIDER_SITE_OTHER): Payer: BC Managed Care – PPO

## 2013-10-05 ENCOUNTER — Ambulatory Visit (INDEPENDENT_AMBULATORY_CARE_PROVIDER_SITE_OTHER): Payer: BC Managed Care – PPO | Admitting: Endocrinology

## 2013-10-05 ENCOUNTER — Encounter: Payer: Self-pay | Admitting: Endocrinology

## 2013-10-05 VITALS — BP 120/86 | HR 100 | Temp 98.3°F | Ht 70.0 in | Wt 292.0 lb

## 2013-10-05 DIAGNOSIS — E1149 Type 2 diabetes mellitus with other diabetic neurological complication: Secondary | ICD-10-CM

## 2013-10-05 DIAGNOSIS — E785 Hyperlipidemia, unspecified: Secondary | ICD-10-CM

## 2013-10-05 MED ORDER — GLUCOSE BLOOD VI STRP: ORAL_STRIP | Status: DC

## 2013-10-05 NOTE — Progress Notes (Signed)
Patient ID: Greg Kane, male   DOB: [birth year], 47 y.o.   MRN: [id number]   Reason for Appointment :  Follow up of Type 2 Diabetes  History of Present Illness          Diagnosis: Type 2 diabetes mellitus, date of diagnosis 2008  Background information: Initial diagnosis was made incidentally and had only mild hyperglycemia. Initially was treated with metformin alone and then Actos was added He has not had any formal diabetes education and has had difficulty losing weight His blood sugars have been only fairly well controlled with A1c usually in the 7-8 range Because of inadequate control in 2013 he was given Victoza injection in addition. However Actos was stopped  He thinks this helped bring his blood sugars better controlled but helped his portion control only slightly He did not lose much weight with Victoza In 6/14 he was given Invokana in addition and his Victoza was resumed.  RECENT history   Because of inadequate control and consistently high fasting readings he was started on Amaryl 1 mg daily to be taken at bedtime. His blood sugars appear to be better with this in the morning However he tends to forget this and is now taking it around 7:30 pm. Occasionally will forget and blood sugars will be higher the next morning Oral hypoglycemic regimen: Amaryl 1 mg, Invokana 300 mg, Victoza and metformin 1500 mg Again he has not done any readings after meals despite reminders. He thinks his blood sugars before supper are usually good, not checked recently Fructosamine today indicates improved control and last A1c was about 2 months ago His main problem recently was poor diet which has caused significant weight gain  Glucose monitoring: Using One Touch meter less than once a day.     Blood Glucose readings from meter : readings before breakfast: 139-208, median 157, previously 175,  afternoon 201     Physical activity:  Very little right now (back pain)  Lab Results  Component  Value Date   HGBA1C 7.8* 07/27/2013   HGBA1C 9.0* 04/03/2013   HGBA1C 8.1* 04/26/2012   Lab Results  Component Value Date   MICROALBUR 0.3 05/25/2013   LDLCALC 83 05/25/2013   CREATININE 1.0 07/27/2013     Lab Results  Component Value Date   MICROALBUR 0.3 05/25/2013    Wt Readings from Last 3 Encounters:  10/05/13 292 lb (132.45 kg)  08/02/13 281 lb 6.4 oz (127.642 kg)  06/11/13 280 lb 3.2 oz (127.098 kg)      Medication List       This list is accurate as of: 10/05/13  4:14 PM.  Always use your most recent med list.               allopurinol 100 MG tablet  Commonly known as:  ZYLOPRIM  Take 1 tablet (100 mg total) by mouth 2 (two) times daily as needed. For gout     atorvastatin 20 MG tablet  Commonly known as:  LIPITOR  TAKE 1 TABLET BY MOUTH EVERY DAY     Canagliflozin 300 MG Tabs  Commonly known as:  INVOKANA  Take 1 tablet (300 mg total) by mouth daily.     fish oil-omega-3 fatty acids 1000 MG capsule  Take 1 g by mouth daily.     gabapentin 300 MG capsule  Commonly known as:  NEURONTIN  Take 1 capsule (300 mg total) by mouth 3 (three) times daily.     glimepiride 1 MG tablet  Commonly known as:  AMARYL  Take 1 tablet (1 mg total) by mouth at bedtime.     glucose blood test strip  Commonly known as:  ONETOUCH VERIO  Use as instructed to check blood sugars twice a day     losartan 50 MG tablet  Commonly known as:  COZAAR  TAKE 1 TABLET DAILY     metFORMIN 500 MG 24 hr tablet  Commonly known as:  GLUCOPHAGE-XR  Take 3 tablets (1,500 mg total) by mouth daily with breakfast.     NOVOTWIST 32G X 5 MM Misc  Generic drug:  Insulin Pen Needle  USE AS DIRECTED     VICTOZA 18 MG/3ML Sopn  Generic drug:  Liraglutide  INJECT 1.8 MG SUBCUTANEOUSLY EVERY DAY        Allergies:  Allergies  Allergen Reactions  . Penicillins Anaphylaxis  . Influenza Virus Vaccine Split     cellulitis  . Pneumococcal Vaccines Other (See Comments)    Cellulitis,  swelling    Past Medical History  Diagnosis Date  . Allergy   . Hypertension   . Hyperlipidemia   . Gout   . Umbilical hernia   . Diabetes mellitus     Past Surgical History  Procedure Laterality Date  . Appendectomy      1980s    Family History  Problem Relation Age of Onset  . Diabetes Mother   . Hyperlipidemia Father   . Hypertension Father   . Dementia Father     Social History:  reports that he has been smoking Cigarettes and Cigars.  He has been smoking about 0.00 packs per day. He has never used smokeless tobacco. He reports that he drinks alcohol. He reports that he does not use illicit drugs.    Review of Systems      Hyperlipidemia: He has mixed hyperlipidemia and has been taking Lipitor 20 mg, fish oil.  Lab Results  Component Value Date   CHOL 133 05/25/2013   HDL 29.00* 05/25/2013   LDLCALC 83 05/25/2013   LDLDIRECT 183.0 04/03/2013   TRIG 106.0 05/25/2013   CHOLHDL 5 05/25/2013        He had painful burning sensation in his feet especially in the evenings but this now appears to be resolved. Was given gabapentin but not using this Asking about taking gabapentin for his low back pain and radiculopathy    Hypertension: He is on only low-dose losartan and has no history of proteinuria   Appointment on 10/02/2013  Component Date Value Range Status  . Fructosamine 10/02/2013 253  <285 umol/L Final   Comment:                            Variations in levels of serum proteins (albumin and immunoglobulins)                          may affect fructosamine results.                             . Glucose, Bld 10/02/2013 121* 70 - 99 mg/dL Final    Physical Examination:  BP 120/86  Pulse 100  Temp(Src) 98.3 F (36.8 C) (Oral)  Ht 5\' 10"  (1.778 m)  Wt 292 lb (132.45 kg)  BMI 41.90 kg/m2  SpO2 98%   ASSESSMENT/PLAN: :  Diabetes type 2, uncontrolled   His blood  sugars are still high in the morning but appear to be better with 1 mg Amaryl   Recommendations made today:  He can be better compliant with Amaryl with taking it after supper rather than at bedtime  Improve diet especially high-fat foods, alcohol and sweets that he has been consuming the last month or so  He agrees to start an exercise program and he will try to do water walking at the [facility]  Continue Invokana and Victoza maximum doses as well as 1500 mg metformin which he can tolerate  More readings after supper  Check A1c in 2 months   Diabetic NEUROPATHY: He is having fewer symptoms, indicating better diabetes control   Hyperlipidemia: Followed by PCP. Needs exercise and weight loss for low HDL   Nadiya Pieratt 10/05/2013, 4:14 PM

## 2013-10-05 NOTE — Patient Instructions (Signed)
Please check blood sugars at least half the time about 2 hours after any meal and as directed on waking up. Please bring blood sugar monitor to each visit  Exercise as much as possible

## 2013-11-27 ENCOUNTER — Other Ambulatory Visit: Payer: Self-pay | Admitting: Endocrinology

## 2013-12-07 ENCOUNTER — Other Ambulatory Visit: Payer: Self-pay | Admitting: Endocrinology

## 2013-12-24 ENCOUNTER — Other Ambulatory Visit: Payer: Self-pay | Admitting: Endocrinology

## 2014-01-02 ENCOUNTER — Other Ambulatory Visit: Payer: Self-pay | Admitting: Family Medicine

## 2014-02-07 ENCOUNTER — Other Ambulatory Visit: Payer: Self-pay | Admitting: Family Medicine

## 2014-03-05 ENCOUNTER — Other Ambulatory Visit: Payer: Self-pay | Admitting: Family Medicine

## 2014-04-02 ENCOUNTER — Other Ambulatory Visit: Payer: Self-pay | Admitting: Family Medicine

## 2014-05-02 ENCOUNTER — Telehealth: Payer: Self-pay | Admitting: Family Medicine

## 2014-05-02 ENCOUNTER — Other Ambulatory Visit: Payer: Self-pay | Admitting: Family Medicine

## 2014-05-02 NOTE — Telephone Encounter (Signed)
Refill x 3 

## 2014-05-02 NOTE — Telephone Encounter (Signed)
Refill Request:  NOVOTWIST 32G X 5 MM MISC--USE AS DIRECTED  Last Filled: 08/09/12 Amt Filled:  100 each, 1 refill Last OV: 06/11/13  Please advise.

## 2014-05-02 NOTE — Telephone Encounter (Signed)
Caller name: Cade Relation to pt: Call back number:[phone] Pharmacy:[id number] - [place], [place] - [address] AT [address]   Reason for call: re-fill on NOVOTWIST 32G X 5 MM MISC

## 2014-05-03 MED ORDER — INSULIN PEN NEEDLE 32G X 5 MM MISC: Status: DC

## 2014-06-03 ENCOUNTER — Other Ambulatory Visit: Payer: Self-pay | Admitting: Family Medicine

## 2014-06-26 ENCOUNTER — Other Ambulatory Visit (INDEPENDENT_AMBULATORY_CARE_PROVIDER_SITE_OTHER): Payer: BC Managed Care – PPO

## 2014-06-26 DIAGNOSIS — E1149 Type 2 diabetes mellitus with other diabetic neurological complication: Secondary | ICD-10-CM

## 2014-06-26 LAB — COMPREHENSIVE METABOLIC PANEL
ALT: 32 U/L (ref 0–53)
AST: 25 U/L (ref 0–37)
Albumin: 4.6 g/dL (ref 3.5–5.2)
Alkaline Phosphatase: 63 U/L (ref 39–117)
BUN: 13 mg/dL (ref 6–23)
CO2: 26 meq/L (ref 19–32)
CREATININE: 1.1 mg/dL (ref 0.4–1.5)
Calcium: 9.5 mg/dL (ref 8.4–10.5)
Chloride: 103 mEq/L (ref 96–112)
GFR: 73.59 mL/min (ref >60.00)
Glucose, Bld: 112 mg/dL — ABNORMAL HIGH (ref 70–99)
Potassium: 4.5 mEq/L (ref 3.5–5.1)
Sodium: 136 mEq/L (ref 135–145)
Total Bilirubin: 0.7 mg/dL (ref 0.2–1.2)
Total Protein: 8.1 g/dL (ref 6.0–8.3)

## 2014-06-26 LAB — URINALYSIS, ROUTINE W REFLEX MICROSCOPIC
BILIRUBIN URINE: NEGATIVE
Hgb urine dipstick: NEGATIVE
Ketones, ur: NEGATIVE
LEUKOCYTES UA: NEGATIVE
NITRITE: NEGATIVE
PH: 6 (ref 5.0–8.0)
RBC / HPF: NONE SEEN (ref 0–?)
Specific Gravity, Urine: <=1.005 — AB (ref 1.000–1.030)
Total Protein, Urine: NEGATIVE
Urine Glucose: NEGATIVE
Urobilinogen, UA: 0.2 (ref 0.0–1.0)
WBC UA: NONE SEEN (ref 0–?)

## 2014-06-26 LAB — HEMOGLOBIN A1C: Hgb A1c MFr Bld: 7.5 % — ABNORMAL HIGH (ref 4.6–6.5)

## 2014-07-01 ENCOUNTER — Ambulatory Visit (INDEPENDENT_AMBULATORY_CARE_PROVIDER_SITE_OTHER): Payer: BC Managed Care – PPO | Admitting: Endocrinology

## 2014-07-01 ENCOUNTER — Other Ambulatory Visit: Payer: Self-pay | Admitting: Family Medicine

## 2014-07-01 ENCOUNTER — Encounter: Payer: Self-pay | Admitting: Endocrinology

## 2014-07-01 VITALS — BP 120/86 | HR 100 | Temp 98.4°F | Resp 14 | Ht 70.0 in | Wt 286.8 lb

## 2014-07-01 DIAGNOSIS — E785 Hyperlipidemia, unspecified: Secondary | ICD-10-CM

## 2014-07-01 DIAGNOSIS — IMO0001 Reserved for inherently not codable concepts without codable children: Secondary | ICD-10-CM

## 2014-07-01 DIAGNOSIS — E1165 Type 2 diabetes mellitus with hyperglycemia: Principal | ICD-10-CM

## 2014-07-01 DIAGNOSIS — I10 Essential (primary) hypertension: Secondary | ICD-10-CM

## 2014-07-01 MED ORDER — CANAGLIFLOZIN 300 MG PO TABS: 300.0000 mg | ORAL_TABLET | Freq: Every day | ORAL | Status: DC

## 2014-07-01 MED ORDER — LIRAGLUTIDE 18 MG/3ML ~~LOC~~ SOPN: PEN_INJECTOR | SUBCUTANEOUS | Status: DC

## 2014-07-01 NOTE — Progress Notes (Signed)
Patient ID: Greg Kane, male   DOB: 1966-07-19, 47 y.o.   MRN: [id number]   Reason for Appointment :  Follow up of Type 2 Diabetes  History of Present Illness          Diagnosis: Type 2 diabetes mellitus, date of diagnosis 2008  Background information: Initial diagnosis was made incidentally and had only mild hyperglycemia. Initially was treated with metformin alone and then Actos was added He has not had any formal diabetes education and has had difficulty losing weight His blood sugars have been only fairly well controlled with A1c usually in the 7-8 range Because of inadequate control in 2013 he was given Victoza injection in addition. However Actos was stopped  He thinks this helped bring his blood sugars better controlled but helped his portion control only slightly He did not lose much weight with Victoza In 6/14 he was given Invokana in addition and his Victoza was resumed.  RECENT history   He has not been seen in followup since 10/2013 Although his A1c on improving at 7.8 previously this has not been checked for almost a year now He had been on a regimen of Victoza, metformin, Invokana and 1 mg Amaryl in the evening He was irregular with taking the Amaryl continued high fasting readings Also having been consistently following his diet and gaining weight previously More recently has been able to watch his diet more consistently and lose weight He has been exercising, previously had been limited because of low back pain However A1c is still over 7% He did not refill his Invokana or Amaryl and has not been taking Invokana for 3 months now Currently only on metformin and Victoza Recent fasting blood sugars are persistently high although glucose in the office later in the morning was improved Has not done any readings after dinner and glucose after lunch is somewhat variable   Treatment regimen: Victoza and metformin 1500 mg  Glucose monitoring: Using One Touch meter less than  once a day.     Blood Glucose readings from meter :   PREMEAL Breakfast Lunch  3-5 PM  Bedtime Overall  Glucose range:  161-234    104-236     Mean/median:  182    150    177    Physical activity: Walking 2-6 days  Very little right now (back pain)  Lab Results  Component Value Date   HGBA1C 7.5* 06/26/2014   HGBA1C 7.8* 07/27/2013   HGBA1C 9.0* 04/03/2013   Lab Results  Component Value Date   MICROALBUR 0.3 05/25/2013   LDLCALC 83 05/25/2013   CREATININE 1.1 06/26/2014     Lab Results  Component Value Date   MICROALBUR 0.3 05/25/2013    Wt Readings from Last 3 Encounters:  07/01/14 286 lb 12.8 oz (130.092 kg)  10/05/13 292 lb (132.45 kg)  08/02/13 281 lb 6.4 oz (127.642 kg)      Medication List       This list is accurate as of: 07/01/14  1:59 PM.  Always use your most recent med list.               allopurinol 100 MG tablet  Commonly known as:  ZYLOPRIM  Take 1 tablet (100 mg total) by mouth 2 (two) times daily as needed. For gout     atorvastatin 20 MG tablet  Commonly known as:  LIPITOR  TAKE 1 TABLET BY MOUTH EVERY DAY     fish oil-omega-3 fatty acids 1000 MG capsule  Take 1 g by mouth daily.     gabapentin 300 MG capsule  Commonly known as:  NEURONTIN  Take 1 capsule (300 mg total) by mouth 3 (three) times daily.     glimepiride 1 MG tablet  Commonly known as:  AMARYL  TAKE 1 TABLET BY MOUTH AT BEDTIME     glucose blood test strip  Commonly known as:  ONETOUCH VERIO  Use as instructed to check blood sugars twice a day     Insulin Pen Needle 32G X 5 MM Misc  Commonly known as:  NOVOTWIST  USE AS DIRECTED     INVOKANA 300 MG Tabs  Generic drug:  Canagliflozin  TAKE 1 TABLET BY MOUTH EVERY DAY     Liraglutide 18 MG/3ML Sopn  Commonly known as:  VICTOZA  Inject 1.8 mg daily---office visit due now     losartan 50 MG tablet  Commonly known as:  COZAAR  TAKE 1 TABLET DAILY     metFORMIN 500 MG 24 hr tablet  Commonly known as:  GLUCOPHAGE-XR   Take 3 tablets daily with Breakfast--Labs are due now        Allergies:  Allergies  Allergen Reactions  . Penicillins Anaphylaxis  . Influenza Virus Vaccine Split     cellulitis  . Pneumococcal Vaccines Other (See Comments)    Cellulitis, swelling    Past Medical History  Diagnosis Date  . Allergy   . Hypertension   . Hyperlipidemia   . Gout   . Umbilical hernia   . Diabetes mellitus     Past Surgical History  Procedure Laterality Date  . Appendectomy      1980s    Family History  Problem Relation Age of Onset  . Diabetes Mother   . Hyperlipidemia Father   . Hypertension Father   . Dementia Father     Social History:  reports that he has been smoking Cigarettes and Cigars.  He has been smoking about 0.00 packs per day. He has never used smokeless tobacco. He reports that he drinks alcohol. He reports that he does not use illicit drugs.    Review of Systems      Hyperlipidemia: He has mixed hyperlipidemia and has been taking Lipitor 20 mg, fish oil.  Lab Results  Component Value Date   CHOL 133 05/25/2013   HDL 29.00* 05/25/2013   LDLCALC 83 05/25/2013   LDLDIRECT 183.0 04/03/2013   TRIG 106.0 05/25/2013   CHOLHDL 5 05/25/2013        He had painful burning sensation in his feet especially in the evenings previously.     Hypertension: He is on only low-dose losartan and has no history of proteinuria   Appointment on 06/26/2014  Component Date Value Ref Range Status  . Hemoglobin A1C 06/26/2014 7.5* 4.6 - 6.5 % Final   Glycemic Control Guidelines for People with Diabetes:Non Diabetic:  <6%Goal of Therapy: <7%Additional Action Suggested:  >8%   . Color, Urine 06/26/2014 YELLOW  Yellow;Lt. Yellow Final  . APPearance 06/26/2014 CLEAR  Clear Final  . Specific Gravity, Urine 06/26/2014 <=1.005* 1.000 - 1.030 Final  . pH 06/26/2014 6.0  5.0 - 8.0 Final  . Total Protein, Urine 06/26/2014 NEGATIVE  Negative Final  . Urine Glucose 06/26/2014 NEGATIVE  Negative  Final  . Ketones, ur 06/26/2014 NEGATIVE  Negative Final  . Bilirubin Urine 06/26/2014 NEGATIVE  Negative Final  . Hgb urine dipstick 06/26/2014 NEGATIVE  Negative Final  . Urobilinogen, UA 06/26/2014 0.2  0.0 -  1.0 Final  . Leukocytes, UA 06/26/2014 NEGATIVE  Negative Final  . Nitrite 06/26/2014 NEGATIVE  Negative Final  . WBC, UA 06/26/2014 none seen  0-2/hpf Final  . RBC / HPF 06/26/2014 none seen  0-2/hpf Final  . Squamous Epithelial / LPF 06/26/2014 Rare(0-4/hpf)  Rare(0-4/hpf) Final  . Sodium 06/26/2014 136  135 - 145 mEq/L Final  . Potassium 06/26/2014 4.5  3.5 - 5.1 mEq/L Final  . Chloride 06/26/2014 103  96 - 112 mEq/L Final  . CO2 06/26/2014 26  19 - 32 mEq/L Final  . Glucose, Bld 06/26/2014 112* 70 - 99 mg/dL Final  . BUN 06/26/2014 13  6 - 23 mg/dL Final  . Creatinine, Ser 06/26/2014 1.1  0.4 - 1.5 mg/dL Final  . Total Bilirubin 06/26/2014 0.7  0.2 - 1.2 mg/dL Final  . Alkaline Phosphatase 06/26/2014 63  39 - 117 U/L Final  . AST 06/26/2014 25  0 - 37 U/L Final  . ALT 06/26/2014 32  0 - 53 U/L Final  . Total Protein 06/26/2014 8.1  6.0 - 8.3 g/dL Final  . Albumin 06/26/2014 4.6  3.5 - 5.2 g/dL Final  . Calcium 06/26/2014 9.5  8.4 - 10.5 mg/dL Final  . GFR 06/26/2014 73.59  >60.00 mL/min Final    Physical Examination:  BP 120/86  Pulse 100  Temp(Src) 98.4 F (36.9 C)  Resp 14  Ht 5\' 10"  (1.778 m)  Wt 286 lb 12.8 oz (130.092 kg)  BMI 41.15 kg/m2  SpO2 97%   ASSESSMENT/PLAN:   Diabetes type 2, uncontrolled   His blood sugars are still high in the morning and occasionally after lunch A1c still over 7% However this is despite not taking his Invokana and Amaryl which he was taking previously He has been able to lose weight and is doing better with exercise regimen  Recommendations made today:  He will restart his Invokana as this will help facilitate further control and weight loss  Also need somewhat better blood pressure control and may benefit from  [place]  Will hold off on restarting Amaryl unless fasting readings are significantly high and he has a high A1c   Hyperlipidemia: Followed by PCP. Needs followup levels   Loraine Bhullar 07/01/2014, 1:59 PM

## 2014-07-01 NOTE — Patient Instructions (Signed)
Please check blood sugars at least half the time about 2 hours after any meal and 3-4  times per week on waking up.  Please bring blood sugar monitor to each visit  Invokana 300mg  in ams

## 2014-07-11 ENCOUNTER — Other Ambulatory Visit: Payer: Self-pay | Admitting: *Deleted

## 2014-07-11 MED ORDER — METFORMIN HCL ER 500 MG PO TB24: ORAL_TABLET | ORAL | Status: DC

## 2014-07-19 ENCOUNTER — Other Ambulatory Visit: Payer: Self-pay

## 2014-09-06 ENCOUNTER — Other Ambulatory Visit: Payer: Self-pay | Admitting: Family Medicine

## 2014-09-06 NOTE — Telephone Encounter (Signed)
Patient has not seen Dr.Lowne in over a year. He needs an apt, I can send a 30 day supply to the local pharmacy. Please schedule        KP

## 2014-09-07 NOTE — Telephone Encounter (Signed)
Spoke to patient and he states that he did not request this refill, that he is no longer taking losartan. He did schedule cpe for late January.

## 2014-10-07 ENCOUNTER — Other Ambulatory Visit: Payer: Self-pay | Admitting: Endocrinology

## 2014-10-07 ENCOUNTER — Other Ambulatory Visit: Payer: BC Managed Care – PPO

## 2014-10-07 LAB — LIPID PANEL
CHOLESTEROL: 245 mg/dL — AB (ref 0–200)
HDL: 38 mg/dL — ABNORMAL LOW (ref >39)
LDL Cholesterol: 162 mg/dL — ABNORMAL HIGH (ref 0–99)
Total CHOL/HDL Ratio: 6.4 Ratio
Triglycerides: 224 mg/dL — ABNORMAL HIGH (ref <150)
VLDL: 45 mg/dL — ABNORMAL HIGH (ref 0–40)

## 2014-10-07 LAB — HEMOGLOBIN A1C
Hgb A1c MFr Bld: 7.4 % — ABNORMAL HIGH (ref <5.7)
Mean Plasma Glucose: 166 mg/dL — ABNORMAL HIGH (ref <117)

## 2014-10-07 LAB — MICROALBUMIN / CREATININE URINE RATIO
CREATININE, URINE: 126.2 mg/dL
MICROALB UR: 0.6 mg/dL (ref <2.0)
Microalb Creat Ratio: 4.8 mg/g (ref 0.0–30.0)

## 2014-10-10 ENCOUNTER — Encounter: Payer: Self-pay | Admitting: Endocrinology

## 2014-10-10 ENCOUNTER — Ambulatory Visit (INDEPENDENT_AMBULATORY_CARE_PROVIDER_SITE_OTHER): Payer: BC Managed Care – PPO | Admitting: Endocrinology

## 2014-10-10 VITALS — BP 105/70 | HR 103 | Temp 98.0°F | Resp 14 | Ht 70.0 in | Wt 284.4 lb

## 2014-10-10 DIAGNOSIS — I471 Supraventricular tachycardia: Secondary | ICD-10-CM

## 2014-10-10 DIAGNOSIS — R000 Tachycardia, unspecified: Secondary | ICD-10-CM

## 2014-10-10 DIAGNOSIS — E782 Mixed hyperlipidemia: Secondary | ICD-10-CM

## 2014-10-10 DIAGNOSIS — E1165 Type 2 diabetes mellitus with hyperglycemia: Secondary | ICD-10-CM

## 2014-10-10 DIAGNOSIS — IMO0002 Reserved for concepts with insufficient information to code with codable children: Secondary | ICD-10-CM

## 2014-10-10 NOTE — Patient Instructions (Signed)
Glimeperide 1/2 daily at night and if in 1 week am sugars still > 140 use 1 pill  More sugars at night  Exercise 4-5 days a week

## 2014-10-10 NOTE — Progress Notes (Signed)
Patient ID: Greg Kane, male   DOB: Feb 05, 1966, 49 y.o.   MRN: [id number]   Reason for Appointment :  Follow up of Type 2 Diabetes  History of Present Illness          Diagnosis: Type 2 diabetes mellitus, date of diagnosis 2008  Background information: Initial diagnosis was made incidentally and had only mild hyperglycemia. Initially was treated with metformin alone and then Actos was added He has not had any formal diabetes education and has had difficulty losing weight His blood sugars have been only fairly well controlled with A1c usually in the 7-8 range Because of inadequate control in 2013 he was given Victoza injection in addition. However Actos was stopped  He thinks this helped bring his blood sugars better controlled but helped his portion control only slightly He did not lose much weight with Victoza In 6/14 he was given Invokana in addition and his Victoza was resumed.  RECENT history   He has been on a regimen of Victoza, metformin, Invokana  On his last visit in 9/15 he had run out of his Invokana and his A1c was 7.5 % He had not been taking his Amaryl which he was supposed to take in the evenings as he would forget this In 9/15 his Invokana was restarted along with his metformin and Victoza However he has an A1c of still over 7% His fasting blood sugars are persistently high and averaging nearly 200 He does not check his blood sugars after meals as directed and has only 2 readings in the afternoons, one of them being high Current problems include poor diet over the holidays; less frequent exercise although he is trying to get back into walking. Still has difficulty losing weight not much improved   Treatment regimen: Victoza, Invokana and metformin 1500 mg  Glucose monitoring: Using One Touch meter less than once a day.     Blood Glucose readings from meter :   PRE-MEAL Breakfast Lunch Dinner Bedtime Overall  Glucose range:  152-260    116, 193     Mean/median:       185    Physical activity: Walking for 2 miles, 1-4/7 days    Lab Results  Component Value Date   HGBA1C 7.4* 10/07/2014   HGBA1C 7.5* 06/26/2014   HGBA1C 7.8* 07/27/2013   Lab Results  Component Value Date   MICROALBUR 0.6 10/07/2014   LDLCALC 162* 10/07/2014   CREATININE 1.1 06/26/2014     Lab Results  Component Value Date   MICROALBUR 0.6 10/07/2014    Wt Readings from Last 3 Encounters:  10/10/14 284 lb 6.4 oz (129.003 kg)  07/01/14 286 lb 12.8 oz (130.092 kg)  10/05/13 292 lb (132.45 kg)      Medication List       This list is accurate as of: 10/10/14  1:39 PM.  Always use your most recent med list.               allopurinol 100 MG tablet  Commonly known as:  ZYLOPRIM  Take 1 tablet (100 mg total) by mouth 2 (two) times daily as needed. For gout     atorvastatin 20 MG tablet  Commonly known as:  LIPITOR  TAKE 1 TABLET BY MOUTH EVERY DAY     canagliflozin 300 MG Tabs tablet  Commonly known as:  INVOKANA  Take 1 tablet (300 mg total) by mouth daily before breakfast.     fish oil-omega-3 fatty acids 1000 MG capsule  Take 1 g by mouth daily.     gabapentin 300 MG capsule  Commonly known as:  NEURONTIN  Take 1 capsule (300 mg total) by mouth 3 (three) times daily.     glucose blood test strip  Commonly known as:  ONETOUCH VERIO  Use as instructed to check blood sugars twice a day     Insulin Pen Needle 32G X 5 MM Misc  Commonly known as:  NOVOTWIST  USE AS DIRECTED     Liraglutide 18 MG/3ML Sopn  Commonly known as:  VICTOZA  Inject 1.8 mg daily---office visit due now     losartan 50 MG tablet  Commonly known as:  COZAAR  TAKE 1 TABLET DAILY     metFORMIN 500 MG 24 hr tablet  Commonly known as:  GLUCOPHAGE-XR  TAKE 3 TABLETS DAILY WITH BREAKFAST        Allergies:  Allergies  Allergen Reactions  . Penicillins Anaphylaxis  . Influenza Virus Vaccine Split     cellulitis  . Pneumococcal Vaccines Other (See Comments)    Cellulitis,  swelling    Past Medical History  Diagnosis Date  . Allergy   . Hypertension   . Hyperlipidemia   . Gout   . Umbilical hernia   . Diabetes mellitus     Past Surgical History  Procedure Laterality Date  . Appendectomy      1980s    Family History  Problem Relation Age of Onset  . Diabetes Mother   . Hyperlipidemia Father   . Hypertension Father   . Dementia Father     Social History:  reports that he has been smoking Cigarettes and Cigars.  He has been smoking about 0.00 packs per day. He has never used smokeless tobacco. He reports that he drinks alcohol. He reports that he does not use illicit drugs.    Review of Systems      Hyperlipidemia: He has mixed hyperlipidemia and was taking Lipitor 20 mg, had muscle pain and has stopped this for the last several months.  Has not followed up with PCP for this LDL is significantly high as also triglycerides, this may be partly related to poor diet  Lab Results  Component Value Date   CHOL 245* 10/07/2014   HDL 38* 10/07/2014   LDLCALC 162* 10/07/2014   LDLDIRECT 183.0 04/03/2013   TRIG 224* 10/07/2014   CHOLHDL 6.4 10/07/2014        He had painful burning sensation in his feet especially in the evenings, previously treated with gabapentin.     Hypertension: He is not on any medications currently  He denies any symptoms of palpitations, heat intolerance, fatigue, weakness or shakiness His pulse rate is fast and has been fast on previous visits also No recent thyroid levels available  Lab Results  Component Value Date   TSH 1.45 02/03/2007      LABS:   Orders Only on 10/07/2014  Component Date Value Ref Range Status  . Microalb, Ur 10/07/2014 0.6  <2.0 mg/dL Final   Comment: ** Please note change in reference range(s). ** The ADA (Diabetes Care 4010;27(OZDGU 1):S14-S80) has defined abnormalities in albumin excretion as follows:            Category           Result                            (mg/g  creatinine)  Normal:    <30       Microalbuminuria:    30 - 299   Clinical albuminuria:    > or = 300    The ADA recommends that at least two of three specimens collected within a 3 - 6 month period be abnormal before considering a patient to be within a diagnostic category.   . Creatinine, Urine 10/07/2014 126.2   Final   No reference range established.  . Microalb Creat Ratio 10/07/2014 4.8  0.0 - 30.0 mg/g Final  . Cholesterol 10/07/2014 245* 0 - 200 mg/dL Final   Comment: ATP III Classification:       < 200        mg/dL        Desirable      200 - 239     mg/dL        Borderline High      >= 240        mg/dL        High     . Triglycerides 10/07/2014 224* <150 mg/dL Final  . HDL 10/07/2014 38* >39 mg/dL Final  . Total CHOL/HDL Ratio 10/07/2014 6.4   Final  . VLDL 10/07/2014 45* 0 - 40 mg/dL Final  . LDL Cholesterol 10/07/2014 162* 0 - 99 mg/dL Final   Comment:   Total Cholesterol/HDL Ratio:CHD Risk                        Coronary Heart Disease Risk Table                                        Men       Women          1/2 Average Risk              3.4        3.3              Average Risk              5.0        4.4           2X Average Risk              9.6        7.1           3X Average Risk             23.4       11.0 Use the calculated Patient Ratio above and the CHD Risk table  to determine the patient's CHD Risk. ATP III Classification (LDL):       < 100        mg/dL         Optimal      100 - 129     mg/dL         Near or Above Optimal      130 - 159     mg/dL         Borderline High      160 - 189     mg/dL         High       > 190        mg/dL         Very High     . Hgb A1c MFr  Bld 10/07/2014 7.4* <5.7 % Final   Comment:                                                                        According to the ADA Clinical Practice Recommendations for 2011, when HbA1c is used as a screening test:     >=6.5%   Diagnostic of Diabetes Mellitus             (if abnormal result is confirmed)   5.7-6.4%   Increased risk of developing Diabetes Mellitus   References:Diagnosis and Classification of Diabetes Mellitus,Diabetes ZJIR,6789,38(BOFBP 1):S62-S69 and Standards of Medical Care in         Diabetes - 2011,Diabetes [id number],8527,78 (Suppl 1):S11-S61.     . Mean Plasma Glucose 10/07/2014 166* <117 mg/dL Final    Physical Examination:  BP 126/91 mmHg  Pulse 103  Temp(Src) 98 F (36.7 C)  Resp 14  Ht 5\' 10"  (1.778 m)  Wt 284 lb 6.4 oz (129.003 kg)  BMI 40.81 kg/m2  SpO2 96%  Repeat blood pressure 105/70 with large cuff Thyroid not palpable.  No tremor present. Heart rate is 108, regular No edema Reflexes are normal   ASSESSMENT/PLAN:   Diabetes type 2, uncontrolled   His blood sugars are still high in the morning despite starting Invokana He is not monitoring after meals as directed Recently part of his problem with poor control is inconsistent diet over the holidays A1c still over 7% He has been able to lose weight despite taking both Invokana and Victoza  Recommendations made today:  He will restart his Amaryl in the evenings, he can take this at supper time if this is easier to remember  May need to consider basal insulin and fasting blood sugars and A1c continue to be high  Start checking blood sugars at various times of the day including after supper  More consistent exercise  Improve diet   Consider consultation with dietitian  Complications: Mild symptomatic neuropathy, currently better.  No microalbuminuria   Hyperlipidemia: Followed by PCP He claims he is intolerant to both Crestor and Lipitor and will have him discuss alternatives such as liver low more pravastatin with PCP  Sinus tachycardia: Clinically does not appear to be hyperthyroid but will need to have him get a TSH checked with his PCP this month  Counseling time over 50% of today's 25 minute visit  Chisom Muntean 10/10/2014, 1:39 PM

## 2014-10-11 NOTE — Progress Notes (Signed)
No that is fine

## 2014-10-14 ENCOUNTER — Other Ambulatory Visit: Payer: Self-pay | Admitting: *Deleted

## 2014-10-14 ENCOUNTER — Telehealth: Payer: Self-pay | Admitting: Endocrinology

## 2014-10-14 MED ORDER — GLIMEPIRIDE 1 MG PO TABS: 1.0000 mg | ORAL_TABLET | Freq: Every day | ORAL | Status: DC

## 2014-10-14 NOTE — Telephone Encounter (Signed)
rx sent

## 2014-10-14 NOTE — Telephone Encounter (Signed)
Please call in the new med Dr. Dwyane Dee wanted him to start [facility] on [address] he thinks it was glimipizide

## 2014-11-01 ENCOUNTER — Encounter: Payer: Self-pay | Admitting: Family Medicine

## 2014-11-01 ENCOUNTER — Ambulatory Visit (INDEPENDENT_AMBULATORY_CARE_PROVIDER_SITE_OTHER): Payer: BC Managed Care – PPO | Admitting: Family Medicine

## 2014-11-01 VITALS — BP 115/82 | HR 104 | Temp 98.0°F | Ht 70.0 in | Wt 283.4 lb

## 2014-11-01 DIAGNOSIS — E785 Hyperlipidemia, unspecified: Secondary | ICD-10-CM

## 2014-11-01 DIAGNOSIS — E1165 Type 2 diabetes mellitus with hyperglycemia: Secondary | ICD-10-CM

## 2014-11-01 DIAGNOSIS — I10 Essential (primary) hypertension: Secondary | ICD-10-CM

## 2014-11-01 DIAGNOSIS — E1159 Type 2 diabetes mellitus with other circulatory complications: Secondary | ICD-10-CM

## 2014-11-01 DIAGNOSIS — IMO0002 Reserved for concepts with insufficient information to code with codable children: Secondary | ICD-10-CM

## 2014-11-01 DIAGNOSIS — Z0000 Encounter for general adult medical examination without abnormal findings: Secondary | ICD-10-CM

## 2014-11-01 DIAGNOSIS — R000 Tachycardia, unspecified: Secondary | ICD-10-CM

## 2014-11-01 DIAGNOSIS — E1151 Type 2 diabetes mellitus with diabetic peripheral angiopathy without gangrene: Secondary | ICD-10-CM

## 2014-11-01 LAB — CBC WITH DIFFERENTIAL/PLATELET
Basophils Absolute: 0 10*3/uL (ref 0.0–0.1)
Basophils Relative: 0 % (ref 0–1)
Eosinophils Absolute: 0.1 10*3/uL (ref 0.0–0.7)
Eosinophils Relative: 1 % (ref 0–5)
HCT: 50.4 % (ref 39.0–52.0)
HEMOGLOBIN: 17.5 g/dL — AB (ref 13.0–17.0)
Lymphocytes Relative: 32 % (ref 12–46)
Lymphs Abs: 3.6 10*3/uL (ref 0.7–4.0)
MCH: 29.9 pg (ref 26.0–34.0)
MCHC: 34.7 g/dL (ref 30.0–36.0)
MCV: 86 fL (ref 78.0–100.0)
MONOS PCT: 8 % (ref 3–12)
MPV: 10.5 fL (ref 8.6–12.4)
Monocytes Absolute: 0.9 10*3/uL (ref 0.1–1.0)
NEUTROS PCT: 59 % (ref 43–77)
Neutro Abs: 6.5 10*3/uL (ref 1.7–7.7)
Platelets: 247 10*3/uL (ref 150–400)
RBC: 5.86 MIL/uL — ABNORMAL HIGH (ref 4.22–5.81)
RDW: 14.3 % (ref 11.5–15.5)
WBC: 11.1 10*3/uL — ABNORMAL HIGH (ref 4.0–10.5)

## 2014-11-01 LAB — EKG 12-LEAD

## 2014-11-01 MED ORDER — PITAVASTATIN CALCIUM 2 MG PO TABS: ORAL_TABLET | ORAL | Status: DC

## 2014-11-01 NOTE — Assessment & Plan Note (Signed)
Per endo °

## 2014-11-01 NOTE — Progress Notes (Signed)
Subjective:    Patient ID: Greg Kane, male    DOB: 30-Mar-1966, 49 y.o.   MRN: [id number]  HPI  Patient here for cpe.  Pt sees Dr Dwyane Dee for dm.  No complaints.    Past Medical History  Diagnosis Date  . Allergy   . Hypertension   . Hyperlipidemia   . Gout   . Umbilical hernia   . Diabetes mellitus    Family History  Problem Relation Age of Onset  . Diabetes Mother   . Hyperlipidemia Father   . Hypertension Father   . Dementia Father    Past Surgical History  Procedure Laterality Date  . Appendectomy      1980s   Current Outpatient Prescriptions  Medication Sig Dispense Refill  . allopurinol (ZYLOPRIM) 100 MG tablet Take 1 tablet (100 mg total) by mouth 2 (two) times daily as needed. For gout 180 tablet 1  . Canagliflozin (INVOKANA) 300 MG TABS Take 1 tablet (300 mg total) by mouth daily before breakfast. 30 tablet 3  . fish oil-omega-3 fatty acids 1000 MG capsule Take 1 g by mouth daily.      Marland Kitchen gabapentin (NEURONTIN) 300 MG capsule Take 1 capsule (300 mg total) by mouth 3 (three) times daily. (Patient taking differently: Take 300 mg by mouth as needed. ) 90 capsule 3  . glimepiride (AMARYL) 1 MG tablet Take 1 tablet (1 mg total) by mouth daily with breakfast. 30 tablet 3  . glucose blood (ONETOUCH VERIO) test strip Use as instructed to check blood sugars twice a day 100 each 12  . Insulin Pen Needle (NOVOTWIST) 32G X 5 MM MISC USE AS DIRECTED 100 each 1  . Liraglutide (VICTOZA) 18 MG/3ML SOPN Inject 1.8 mg daily---office visit due now 9 mL 2  . metFORMIN (GLUCOPHAGE-XR) 500 MG 24 hr tablet TAKE 3 TABLETS DAILY WITH BREAKFAST 270 tablet 1  . Pitavastatin Calcium 2 MG TABS 1 po qhs 30 tablet 2   No current facility-administered medications for this visit.   Allergies  Allergen Reactions  . Penicillins Anaphylaxis  . Influenza Virus Vaccine Split     cellulitis  . Pneumococcal Vaccines Other (See Comments)    Cellulitis, swelling   History   Social History    . Marital Status: Married    Spouse Name: N/A    Number of Children: N/A  . Years of Education: N/A   Occupational History  . [employer]    Social History Main Topics  . Smoking status: Current Some Day Smoker    Types: Cigarettes, Cigars    Last Attempt to Quit: 10/12/2008  . Smokeless tobacco: Never Used     Comment: pt doesn't buy cig-- borrows from others no more than 4 cig a week  . Alcohol Use: Yes     Comment: once a week   . Drug Use: No  . Sexual Activity: Yes   Other Topics Concern  . Not on file   Social History Narrative   Married   Children 3   Engineer, production   Exercise -a little          Review of Systems  Constitutional: Negative for fever, chills, diaphoresis, activity change, appetite change, fatigue and unexpected weight change.  HENT: Negative for congestion, ear pain, hearing loss, nosebleeds, postnasal drip, rhinorrhea, sinus pressure, sneezing and tinnitus.   Eyes: Negative for photophobia, discharge, itching and visual disturbance.  Respiratory: Negative.  Negative for cough and wheezing.   Cardiovascular:  Negative.  Negative for chest pain, palpitations and leg swelling.  Gastrointestinal: Negative for abdominal pain, constipation, blood in stool, abdominal distention and anal bleeding.  Endocrine: Negative.  Negative for cold intolerance, heat intolerance, polydipsia, polyphagia and polyuria.  Genitourinary: Negative.  Negative for dysuria, frequency, hematuria, penile swelling, scrotal swelling, difficulty urinating, penile pain and testicular pain.  Musculoskeletal: Negative.  Negative for myalgias, back pain, arthralgias and gait problem.  Skin: Negative.   Allergic/Immunologic: Negative.   Neurological: Positive for numbness. Negative for dizziness, weakness, light-headedness and headaches.       + numbness and tingling in toes  Psychiatric/Behavioral: Negative for suicidal ideas, confusion, sleep disturbance, dysphoric mood,  decreased concentration and agitation. The patient is not nervous/anxious.        Objective:    Physical Exam  Constitutional: He is oriented to person, place, and time. He appears well-developed and well-nourished. No distress.  HENT:  Head: Normocephalic and atraumatic.  Right Ear: External ear normal.  Left Ear: External ear normal.  Nose: Nose normal.  Mouth/Throat: Oropharynx is clear and moist. No oropharyngeal exudate.  Eyes: Conjunctivae and EOM are normal. Pupils are equal, round, and reactive to light. Right eye exhibits no discharge. Left eye exhibits no discharge.  Neck: Normal range of motion. Neck supple. No JVD present. No thyromegaly present.  Cardiovascular: Normal rate, regular rhythm and intact distal pulses.  Exam reveals no gallop and no friction rub.   No murmur heard. Pulmonary/Chest: Effort normal and breath sounds normal. No respiratory distress. He has no wheezes. He has no rales. He exhibits no tenderness.  Abdominal: Soft. Bowel sounds are normal. He exhibits no distension and no mass. There is no tenderness. There is no rebound and no guarding.  Genitourinary: Rectum normal, prostate normal and penis normal. Guaiac negative stool.  Musculoskeletal: Normal range of motion. He exhibits no edema or tenderness.  Lymphadenopathy:    He has no cervical adenopathy.  Neurological: He is alert and oriented to person, place, and time. He displays normal reflexes. He exhibits normal muscle tone.  Skin: Skin is warm and dry. No rash noted. He is not diaphoretic. No erythema. No pallor.  Psychiatric: He has a normal mood and affect. His behavior is normal. Judgment and thought content normal.    BP 115/82 mmHg  Pulse 104  Temp(Src) 98 F (36.7 C) (Oral)  Ht 5\' 10"  (1.778 m)  Wt 283 lb 6.4 oz (128.549 kg)  BMI 40.66 kg/m2  SpO2 95% Wt Readings from Last 3 Encounters:  11/01/14 283 lb 6.4 oz (128.549 kg)  10/10/14 284 lb 6.4 oz (129.003 kg)  07/01/14 286 lb 12.8  oz (130.092 kg)     Lab Results  Component Value Date   WBC 11.1* 06/11/2013   HGB 16.6 06/11/2013   HCT 48.9 06/11/2013   PLT 214.0 06/11/2013   GLUCOSE 112* 06/26/2014   CHOL 245* 10/07/2014   TRIG 224* 10/07/2014   HDL 38* 10/07/2014   LDLDIRECT 183.0 04/03/2013   LDLCALC 162* 10/07/2014   ALT 32 06/26/2014   AST 25 06/26/2014   NA 136 06/26/2014   K 4.5 06/26/2014   CL 103 06/26/2014   CREATININE 1.1 06/26/2014   BUN 13 06/26/2014   CO2 26 06/26/2014   TSH 1.45 02/03/2007   PSA 1.32 06/11/2013   HGBA1C 7.4* 10/07/2014   MICROALBUR 0.6 10/07/2014    Ct Abdomen Pelvis W Contrast  10/13/2011   *RADIOLOGY REPORT*  Clinical Data: Abdominal pain.  Distention.  CT  ABDOMEN AND PELVIS WITH CONTRAST  Technique:  Multidetector CT imaging of the abdomen and pelvis was performed following the standard protocol during bolus administration of intravenous contrast.  Contrast: 182mL OMNIPAQUE IOHEXOL 300 MG/ML IV SOLN, 71mL OMNIPAQUE IOHEXOL 300 MG/ML IV SOLN  Comparison: None.  Findings: Lung bases clear aside from lingular scarring or atelectasis and some dependent atelectasis.  Visualized portions of the heart appear within normal limits.  Fatty liver is present.  Spleen appears normal.  Adrenal glands normal.  The normal renal enhancement and delayed excretion of contrast.  The pancreas appears within normal limits.  The gallbladder unremarkable.  No calcified gallstones.  No common bile duct dilation.  The stomach and small bowel proximally appear normal.  There is fecalization of distal small bowel suggesting stasis.  Fat containing umbilical hernia.   Colonic diverticulosis is present without diverticulitis.  Appendix not identified.  There is angulation of small bowel loops in the right lower quadrant, suggesting adhesive partial small bowel obstruction. Several decompressed loops of bowel are present.  This is in the region of fecalized small bowel.  Urinary bladder appears normal.   Tortuous iliac arteries.  No aggressive osseous lesions are present. Fat containing left inguinal hernia.  IMPRESSION: 1.  Fecalization of distal small bowel with several loops of decompressed small bowel and acute angulation in the right lower quadrant.  Findings suggestive of partial small bowel obstruction. 2.  The fatty liver. 3.  Small fat containing umbilical hernia. Fat containing left inguinal hernia. 4.  Appendectomy.  Original Report Authenticated By: Dereck Ligas, M.D.      Assessment & Plan:   Problem List Items Addressed This Visit    Preventative health care    ghm utd Check labs Pt refused flu and pneumonia vaccines      Relevant Orders   Ambulatory referral to Gastroenterology    Other Visit Diagnoses    Hyperlipidemia    -  Primary    Relevant Medications    Pitavastatin Calcium 2 MG TABS    Other Relevant Orders    CBC with Differential/Platelet    Microalbumin / creatinine urine ratio    POCT urinalysis dipstick    PSA    TSH    Tachycardia        Relevant Orders    EKG 12-Lead (Completed)    Cardiac event monitor    2D Echocardiogram without contrast    Essential hypertension        Relevant Medications    Pitavastatin Calcium 2 MG TABS    Other Relevant Orders    CBC with Differential/Platelet    Microalbumin / creatinine urine ratio    POCT urinalysis dipstick    PSA    TSH    2D Echocardiogram without contrast        Garnet Koyanagi, DO

## 2014-11-01 NOTE — Progress Notes (Signed)
Pre visit review using our clinic review tool, if applicable. No additional management support is needed unless otherwise documented below in the visit note. 

## 2014-11-01 NOTE — Assessment & Plan Note (Signed)
ghm utd Check labs Pt refused flu and pneumonia vaccines

## 2014-11-01 NOTE — Patient Instructions (Signed)
Preventive Care for Adults A healthy lifestyle and preventive care can promote health and wellness. Preventive health guidelines for men include the following key practices:  A routine yearly physical is a good way to check with your health care provider about your health and preventative screening. It is a chance to share any concerns and updates on your health and to receive a thorough exam.  Visit your dentist for a routine exam and preventative care every 6 months. Brush your teeth twice a day and floss once a day. Good oral hygiene prevents tooth decay and gum disease.  The frequency of eye exams is based on your age, health, family medical history, use of contact lenses, and other factors. Follow your health care provider's recommendations for frequency of eye exams.  Eat a healthy diet. Foods such as vegetables, fruits, whole grains, low-fat dairy products, and lean protein foods contain the nutrients you need without too many calories. Decrease your intake of foods high in solid fats, added sugars, and salt. Eat the right amount of calories for you.Get information about a proper diet from your health care provider, if necessary.  Regular physical exercise is one of the most important things you can do for your health. Most adults should get at least 150 minutes of moderate-intensity exercise (any activity that increases your heart rate and causes you to sweat) each week. In addition, most adults need muscle-strengthening exercises on 2 or more days a week.  Maintain a healthy weight. The body mass index (BMI) is a screening tool to identify possible weight problems. It provides an estimate of body fat based on height and weight. Your health care provider can find your BMI and can help you achieve or maintain a healthy weight.For adults 20 years and older:  A BMI below 18.5 is considered underweight.  A BMI of 18.5 to 24.9 is normal.  A BMI of 25 to 29.9 is considered overweight.  A BMI  of 30 and above is considered obese.  Maintain normal blood lipids and cholesterol levels by exercising and minimizing your intake of saturated fat. Eat a balanced diet with plenty of fruit and vegetables. Blood tests for lipids and cholesterol should begin at age 50 and be repeated every 5 years. If your lipid or cholesterol levels are high, you are over 50, or you are at high risk for heart disease, you may need your cholesterol levels checked more frequently.Ongoing high lipid and cholesterol levels should be treated with medicines if diet and exercise are not working.  If you smoke, find out from your health care provider how to quit. If you do not use tobacco, do not start.  Lung cancer screening is recommended for adults aged 73-80 years who are at high risk for developing lung cancer because of a history of smoking. A yearly low-dose CT scan of the lungs is recommended for people who have at least a 30-pack-year history of smoking and are a current smoker or have quit within the past 15 years. A pack year of smoking is smoking an average of 1 pack of cigarettes a day for 1 year (for example: 1 pack a day for 30 years or 2 packs a day for 15 years). Yearly screening should continue until the smoker has stopped smoking for at least 15 years. Yearly screening should be stopped for people who develop a health problem that would prevent them from having lung cancer treatment.  If you choose to drink alcohol, do not have more than  2 drinks per day. One drink is considered to be 12 ounces (355 mL) of beer, 5 ounces (148 mL) of wine, or 1.5 ounces (44 mL) of liquor.  Avoid use of street drugs. Do not share needles with anyone. Ask for help if you need support or instructions about stopping the use of drugs.  High blood pressure causes heart disease and increases the risk of stroke. Your blood pressure should be checked at least every 1-2 years. Ongoing high blood pressure should be treated with  medicines, if weight loss and exercise are not effective.  If you are 45-79 years old, ask your health care provider if you should take aspirin to prevent heart disease.  Diabetes screening involves taking a blood sample to check your fasting blood sugar level. This should be done once every 3 years, after age 45, if you are within normal weight and without risk factors for diabetes. Testing should be considered at a younger age or be carried out more frequently if you are overweight and have at least 1 risk factor for diabetes.  Colorectal cancer can be detected and often prevented. Most routine colorectal cancer screening begins at the age of 50 and continues through age 75. However, your health care provider may recommend screening at an earlier age if you have risk factors for colon cancer. On a yearly basis, your health care provider may provide home test kits to check for hidden blood in the stool. Use of a small camera at the end of a tube to directly examine the colon (sigmoidoscopy or colonoscopy) can detect the earliest forms of colorectal cancer. Talk to your health care provider about this at age 50, when routine screening begins. Direct exam of the colon should be repeated every 5-10 years through age 75, unless early forms of precancerous polyps or small growths are found.  People who are at an increased risk for hepatitis B should be screened for this virus. You are considered at high risk for hepatitis B if:  You were born in a country where hepatitis B occurs often. Talk with your health care provider about which countries are considered high risk.  Your parents were born in a high-risk country and you have not received a shot to protect against hepatitis B (hepatitis B vaccine).  You have HIV or AIDS.  You use needles to inject street drugs.  You live with, or have sex with, someone who has hepatitis B.  You are a man who has sex with other men (MSM).  You get hemodialysis  treatment.  You take certain medicines for conditions such as cancer, organ transplantation, and autoimmune conditions.  Hepatitis C blood testing is recommended for all people born from 1945 through 1965 and any individual with known risks for hepatitis C.  Practice safe sex. Use condoms and avoid high-risk sexual practices to reduce the spread of sexually transmitted infections (STIs). STIs include gonorrhea, chlamydia, syphilis, trichomonas, herpes, HPV, and human immunodeficiency virus (HIV). Herpes, HIV, and HPV are viral illnesses that have no cure. They can result in disability, cancer, and death.  If you are at risk of being infected with HIV, it is recommended that you take a prescription medicine daily to prevent HIV infection. This is called preexposure prophylaxis (PrEP). You are considered at risk if:  You are a man who has sex with other men (MSM) and have other risk factors.  You are a heterosexual man, are sexually active, and are at increased risk for HIV infection.    You take drugs by injection.  You are sexually active with a partner who has HIV.  Talk with your health care provider about whether you are at high risk of being infected with HIV. If you choose to begin PrEP, you should first be tested for HIV. You should then be tested every 3 months for as long as you are taking PrEP.  A one-time screening for abdominal aortic aneurysm (AAA) and surgical repair of large AAAs by ultrasound are recommended for men ages 32 to 67 years who are current or former smokers.  Healthy men should no longer receive prostate-specific antigen (PSA) blood tests as part of routine cancer screening. Talk with your health care provider about prostate cancer screening.  Testicular cancer screening is not recommended for adult males who have no symptoms. Screening includes self-exam, a health care provider exam, and other screening tests. Consult with your health care provider about any symptoms  you have or any concerns you have about testicular cancer.  Use sunscreen. Apply sunscreen liberally and repeatedly throughout the day. You should seek shade when your shadow is shorter than you. Protect yourself by wearing long sleeves, pants, a wide-brimmed hat, and sunglasses year round, whenever you are outdoors.  Once a month, do a whole-body skin exam, using a mirror to look at the skin on your back. Tell your health care provider about new moles, moles that have irregular borders, moles that are larger than a pencil eraser, or moles that have changed in shape or color.  Stay current with required vaccines (immunizations).  Influenza vaccine. All adults should be immunized every year.  Tetanus, diphtheria, and acellular pertussis (Td, Tdap) vaccine. An adult who has not previously received Tdap or who does not know his vaccine status should receive 1 dose of Tdap. This initial dose should be followed by tetanus and diphtheria toxoids (Td) booster doses every 10 years. Adults with an unknown or incomplete history of completing a 3-dose immunization series with Td-containing vaccines should begin or complete a primary immunization series including a Tdap dose. Adults should receive a Td booster every 10 years.  Varicella vaccine. An adult without evidence of immunity to varicella should receive 2 doses or a second dose if he has previously received 1 dose.  Human papillomavirus (HPV) vaccine. Males aged 68-21 years who have not received the vaccine previously should receive the 3-dose series. Males aged 22-26 years may be immunized. Immunization is recommended through the age of 6 years for any male who has sex with males and did not get any or all doses earlier. Immunization is recommended for any person with an immunocompromised condition through the age of 49 years if he did not get any or all doses earlier. During the 3-dose series, the second dose should be obtained 4-8 weeks after the first  dose. The third dose should be obtained 24 weeks after the first dose and 16 weeks after the second dose.  Zoster vaccine. One dose is recommended for adults aged 50 years or older unless certain conditions are present.  Measles, mumps, and rubella (MMR) vaccine. Adults born before [birth year] generally are considered immune to measles and mumps. Adults born in [birth year] or later should have 1 or more doses of MMR vaccine unless there is a contraindication to the vaccine or there is laboratory evidence of immunity to each of the three diseases. A routine second dose of MMR vaccine should be obtained at least 28 days after the first dose for students attending postsecondary  schools, health care workers, or international travelers. People who received inactivated measles vaccine or an unknown type of measles vaccine during 1963-1967 should receive 2 doses of MMR vaccine. People who received inactivated mumps vaccine or an unknown type of mumps vaccine before 1979 and are at high risk for mumps infection should consider immunization with 2 doses of MMR vaccine. Unvaccinated health care workers born before 1957 who lack laboratory evidence of measles, mumps, or rubella immunity or laboratory confirmation of disease should consider measles and mumps immunization with 2 doses of MMR vaccine or rubella immunization with 1 dose of MMR vaccine.  Pneumococcal 13-valent conjugate (PCV13) vaccine. When indicated, a person who is uncertain of his immunization history and has no record of immunization should receive the PCV13 vaccine. An adult aged 19 years or older who has certain medical conditions and has not been previously immunized should receive 1 dose of PCV13 vaccine. This PCV13 should be followed with a dose of pneumococcal polysaccharide (PPSV23) vaccine. The PPSV23 vaccine dose should be obtained at least 8 weeks after the dose of PCV13 vaccine. An adult aged 19 years or older who has certain medical conditions and  previously received 1 or more doses of PPSV23 vaccine should receive 1 dose of PCV13. The PCV13 vaccine dose should be obtained 1 or more years after the last PPSV23 vaccine dose.  Pneumococcal polysaccharide (PPSV23) vaccine. When PCV13 is also indicated, PCV13 should be obtained first. All adults aged 65 years and older should be immunized. An adult younger than age 65 years who has certain medical conditions should be immunized. Any person who resides in a nursing home or long-term care facility should be immunized. An adult smoker should be immunized. People with an immunocompromised condition and certain other conditions should receive both PCV13 and PPSV23 vaccines. People with human immunodeficiency virus (HIV) infection should be immunized as soon as possible after diagnosis. Immunization during chemotherapy or radiation therapy should be avoided. Routine use of PPSV23 vaccine is not recommended for American Indians, [place] Natives, or people younger than 65 years unless there are medical conditions that require PPSV23 vaccine. When indicated, people who have unknown immunization and have no record of immunization should receive PPSV23 vaccine. One-time revaccination 5 years after the first dose of PPSV23 is recommended for people aged 19-64 years who have chronic kidney failure, nephrotic syndrome, asplenia, or immunocompromised conditions. People who received 1-2 doses of PPSV23 before age 65 years should receive another dose of PPSV23 vaccine at age 65 years or later if at least 5 years have passed since the previous dose. Doses of PPSV23 are not needed for people immunized with PPSV23 at or after age 65 years.  Meningococcal vaccine. Adults with asplenia or persistent complement component deficiencies should receive 2 doses of quadrivalent meningococcal conjugate (MenACWY-D) vaccine. The doses should be obtained at least 2 months apart. Microbiologists working with certain meningococcal bacteria,  military recruits, people at risk during an outbreak, and people who travel to or live in countries with a high rate of meningitis should be immunized. A first-year college student up through age 21 years who is living in a residence hall should receive a dose if he did not receive a dose on or after his 16th birthday. Adults who have certain high-risk conditions should receive one or more doses of vaccine.  Hepatitis A vaccine. Adults who wish to be protected from this disease, have certain high-risk conditions, work with hepatitis A-infected animals, work in hepatitis A research labs, or   travel to or work in countries with a high rate of hepatitis A should be immunized. Adults who were previously unvaccinated and who anticipate close contact with an international adoptee during the first 60 days after arrival in the [place] from a country with a high rate of hepatitis A should be immunized.  Hepatitis B vaccine. Adults should be immunized if they wish to be protected from this disease, have certain high-risk conditions, may be exposed to blood or other infectious body fluids, are household contacts or sex partners of hepatitis B positive people, are clients or workers in certain care facilities, or travel to or work in countries with a high rate of hepatitis B.  Haemophilus influenzae type b (Hib) vaccine. A previously unvaccinated person with asplenia or sickle cell disease or having a scheduled splenectomy should receive 1 dose of Hib vaccine. Regardless of previous immunization, a recipient of a hematopoietic stem cell transplant should receive a 3-dose series 6-12 months after his successful transplant. Hib vaccine is not recommended for adults with HIV infection. Preventive Service / Frequency Ages 52 to 17  Blood pressure check.** / Every 1 to 2 years.  Lipid and cholesterol check.** / Every 5 years beginning at age 69.  Hepatitis C blood test.** / For any individual with known risks for  hepatitis C.  Skin self-exam. / Monthly.  Influenza vaccine. / Every year.  Tetanus, diphtheria, and acellular pertussis (Tdap, Td) vaccine.** / Consult your health care provider. 1 dose of Td every 10 years.  Varicella vaccine.** / Consult your health care provider.  HPV vaccine. / 3 doses over 6 months, if 72 or younger.  Measles, mumps, rubella (MMR) vaccine.** / You need at least 1 dose of MMR if you were born in 1957 or later. You may also need a second dose.  Pneumococcal 13-valent conjugate (PCV13) vaccine.** / Consult your health care provider.  Pneumococcal polysaccharide (PPSV23) vaccine.** / 1 to 2 doses if you smoke cigarettes or if you have certain conditions.  Meningococcal vaccine.** / 1 dose if you are age 35 to 60 years and a Market researcher living in a residence hall, or have one of several medical conditions. You may also need additional booster doses.  Hepatitis A vaccine.** / Consult your health care provider.  Hepatitis B vaccine.** / Consult your health care provider.  Haemophilus influenzae type b (Hib) vaccine.** / Consult your health care provider. Ages 35 to 8  Blood pressure check.** / Every 1 to 2 years.  Lipid and cholesterol check.** / Every 5 years beginning at age 57.  Lung cancer screening. / Every year if you are aged 44-80 years and have a 30-pack-year history of smoking and currently smoke or have quit within the past 15 years. Yearly screening is stopped once you have quit smoking for at least 15 years or develop a health problem that would prevent you from having lung cancer treatment.  Fecal occult blood test (FOBT) of stool. / Every year beginning at age 55 and continuing until age 73. You may not have to do this test if you get a colonoscopy every 10 years.  Flexible sigmoidoscopy** or colonoscopy.** / Every 5 years for a flexible sigmoidoscopy or every 10 years for a colonoscopy beginning at age 28 and continuing until age  1.  Hepatitis C blood test.** / For all people born from [birth year] through 1965 and any individual with known risks for hepatitis C.  Skin self-exam. / Monthly.  Influenza vaccine. / Every  year.  Tetanus, diphtheria, and acellular pertussis (Tdap/Td) vaccine.** / Consult your health care provider. 1 dose of Td every 10 years.  Varicella vaccine.** / Consult your health care provider.  Zoster vaccine.** / 1 dose for adults aged 53 years or older.  Measles, mumps, rubella (MMR) vaccine.** / You need at least 1 dose of MMR if you were born in 1957 or later. You may also need a second dose.  Pneumococcal 13-valent conjugate (PCV13) vaccine.** / Consult your health care provider.  Pneumococcal polysaccharide (PPSV23) vaccine.** / 1 to 2 doses if you smoke cigarettes or if you have certain conditions.  Meningococcal vaccine.** / Consult your health care provider.  Hepatitis A vaccine.** / Consult your health care provider.  Hepatitis B vaccine.** / Consult your health care provider.  Haemophilus influenzae type b (Hib) vaccine.** / Consult your health care provider. Ages 77 and over  Blood pressure check.** / Every 1 to 2 years.  Lipid and cholesterol check.**/ Every 5 years beginning at age 85.  Lung cancer screening. / Every year if you are aged 55-80 years and have a 30-pack-year history of smoking and currently smoke or have quit within the past 15 years. Yearly screening is stopped once you have quit smoking for at least 15 years or develop a health problem that would prevent you from having lung cancer treatment.  Fecal occult blood test (FOBT) of stool. / Every year beginning at age 33 and continuing until age 11. You may not have to do this test if you get a colonoscopy every 10 years.  Flexible sigmoidoscopy** or colonoscopy.** / Every 5 years for a flexible sigmoidoscopy or every 10 years for a colonoscopy beginning at age 28 and continuing until age 73.  Hepatitis C blood  test.** / For all people born from [birth year] through 1965 and any individual with known risks for hepatitis C.  Abdominal aortic aneurysm (AAA) screening.** / A one-time screening for ages 50 to 27 years who are current or former smokers.  Skin self-exam. / Monthly.  Influenza vaccine. / Every year.  Tetanus, diphtheria, and acellular pertussis (Tdap/Td) vaccine.** / 1 dose of Td every 10 years.  Varicella vaccine.** / Consult your health care provider.  Zoster vaccine.** / 1 dose for adults aged 34 years or older.  Pneumococcal 13-valent conjugate (PCV13) vaccine.** / Consult your health care provider.  Pneumococcal polysaccharide (PPSV23) vaccine.** / 1 dose for all adults aged 63 years and older.  Meningococcal vaccine.** / Consult your health care provider.  Hepatitis A vaccine.** / Consult your health care provider.  Hepatitis B vaccine.** / Consult your health care provider.  Haemophilus influenzae type b (Hib) vaccine.** / Consult your health care provider. **Family history and personal history of risk and conditions may change your health care provider's recommendations. Document Released: 11/16/2001 Document Revised: 09/25/2013 Document Reviewed: 02/15/2011 [facility] Patient Information 2015 [place], [place]. This information is not intended to replace advice given to you by your health care provider. Make sure you discuss any questions you have with your health care provider.

## 2014-11-02 LAB — TSH: TSH: 1.998 u[IU]/mL (ref 0.350–4.500)

## 2014-11-02 LAB — PSA: PSA: 1.02 ng/mL (ref <=4.00)

## 2014-11-04 ENCOUNTER — Telehealth: Payer: Self-pay | Admitting: Family Medicine

## 2014-11-04 NOTE — Telephone Encounter (Signed)
emmi emailed °

## 2014-11-05 ENCOUNTER — Encounter: Payer: Self-pay | Admitting: Family Medicine

## 2014-11-05 ENCOUNTER — Encounter: Payer: Self-pay | Admitting: Internal Medicine

## 2014-11-05 DIAGNOSIS — E785 Hyperlipidemia, unspecified: Secondary | ICD-10-CM

## 2014-11-05 MED ORDER — PITAVASTATIN CALCIUM 2 MG PO TABS: ORAL_TABLET | ORAL | Status: DC

## 2014-11-06 ENCOUNTER — Ambulatory Visit (HOSPITAL_BASED_OUTPATIENT_CLINIC_OR_DEPARTMENT_OTHER)
Admission: RE | Admit: 2014-11-06 | Discharge: 2014-11-06 | Disposition: A | Discharge status: Home / Self Care | Payer: BC Managed Care – PPO | Source: Ambulatory Visit | Attending: Family Medicine | Admitting: Family Medicine

## 2014-11-06 DIAGNOSIS — R000 Tachycardia, unspecified: Secondary | ICD-10-CM | POA: Diagnosis not present

## 2014-11-06 DIAGNOSIS — I10 Essential (primary) hypertension: Secondary | ICD-10-CM

## 2014-11-06 NOTE — Progress Notes (Signed)
  Echocardiogram 2D Echocardiogram has been performed.  Greg Kane 11/06/2014, 12:35 PM

## 2014-11-07 LAB — MICROALBUMIN / CREATININE URINE RATIO

## 2014-11-07 NOTE — Addendum Note (Signed)
Addended by: Peggyann Shoals on: 11/07/2014 01:55 PM   Modules accepted: Orders

## 2014-11-08 ENCOUNTER — Encounter: Payer: Self-pay | Admitting: Family Medicine

## 2014-11-12 ENCOUNTER — Encounter (INDEPENDENT_AMBULATORY_CARE_PROVIDER_SITE_OTHER): Payer: BC Managed Care – PPO

## 2014-11-12 ENCOUNTER — Encounter: Payer: Self-pay | Admitting: *Deleted

## 2014-11-12 DIAGNOSIS — R000 Tachycardia, unspecified: Secondary | ICD-10-CM

## 2014-11-12 NOTE — Progress Notes (Signed)
Patient ID: Greg Kane, male   DOB: [birth year], 49 y.o.   MRN: [id number] [employer] 30 day cardiac event monitor applied to patient.

## 2014-11-19 ENCOUNTER — Other Ambulatory Visit: Payer: Self-pay | Admitting: Endocrinology

## 2014-12-02 ENCOUNTER — Other Ambulatory Visit: Payer: Self-pay | Admitting: Endocrinology

## 2014-12-17 ENCOUNTER — Other Ambulatory Visit: Payer: Self-pay | Admitting: Endocrinology

## 2015-01-03 ENCOUNTER — Ambulatory Visit (AMBULATORY_SURGERY_CENTER): Payer: Self-pay | Admitting: *Deleted

## 2015-01-03 VITALS — Ht 70.0 in | Wt 285.6 lb

## 2015-01-03 DIAGNOSIS — Z1211 Encounter for screening for malignant neoplasm of colon: Secondary | ICD-10-CM

## 2015-01-03 HISTORY — PX: COLONOSCOPY W/ POLYPECTOMY: SHX1380

## 2015-01-03 NOTE — Progress Notes (Signed)
Denies allergies to eggs or soy products. Denies complications with sedation or anesthesia. Denies O2 use. Denies use of diet or weight loss medications.  Emmi instructions given for colonoscopy.  

## 2015-01-06 ENCOUNTER — Other Ambulatory Visit (INDEPENDENT_AMBULATORY_CARE_PROVIDER_SITE_OTHER): Payer: BC Managed Care – PPO

## 2015-01-06 DIAGNOSIS — E1165 Type 2 diabetes mellitus with hyperglycemia: Secondary | ICD-10-CM | POA: Diagnosis not present

## 2015-01-06 DIAGNOSIS — IMO0002 Reserved for concepts with insufficient information to code with codable children: Secondary | ICD-10-CM

## 2015-01-06 DIAGNOSIS — E785 Hyperlipidemia, unspecified: Secondary | ICD-10-CM

## 2015-01-06 LAB — BASIC METABOLIC PANEL
BUN: 17 mg/dL (ref 6–23)
CHLORIDE: 105 meq/L (ref 96–112)
CO2: 22 mEq/L (ref 19–32)
Calcium: 9.6 mg/dL (ref 8.4–10.5)
Creatinine, Ser: 1.05 mg/dL (ref 0.40–1.50)
GFR: 79.11 mL/min (ref >60.00)
Glucose, Bld: 89 mg/dL (ref 70–99)
POTASSIUM: 4.2 meq/L (ref 3.5–5.1)
SODIUM: 134 meq/L — AB (ref 135–145)

## 2015-01-06 LAB — MICROALBUMIN / CREATININE URINE RATIO
Creatinine,U: 105.6 mg/dL
Microalb Creat Ratio: 0.7 mg/g (ref 0.0–30.0)
Microalb, Ur: <0.7 mg/dL (ref 0.0–1.9)

## 2015-01-06 LAB — HEMOGLOBIN A1C: Hgb A1c MFr Bld: 7.2 % — ABNORMAL HIGH (ref 4.6–6.5)

## 2015-01-08 ENCOUNTER — Other Ambulatory Visit: Payer: Self-pay | Admitting: Endocrinology

## 2015-01-09 ENCOUNTER — Other Ambulatory Visit: Payer: Self-pay | Admitting: *Deleted

## 2015-01-09 ENCOUNTER — Ambulatory Visit (INDEPENDENT_AMBULATORY_CARE_PROVIDER_SITE_OTHER): Payer: BC Managed Care – PPO | Admitting: Endocrinology

## 2015-01-09 ENCOUNTER — Encounter: Payer: Self-pay | Admitting: Endocrinology

## 2015-01-09 VITALS — BP 110/80 | HR 84 | Temp 98.0°F | Wt 286.0 lb

## 2015-01-09 DIAGNOSIS — IMO0002 Reserved for concepts with insufficient information to code with codable children: Secondary | ICD-10-CM

## 2015-01-09 DIAGNOSIS — E1165 Type 2 diabetes mellitus with hyperglycemia: Secondary | ICD-10-CM

## 2015-01-09 DIAGNOSIS — E782 Mixed hyperlipidemia: Secondary | ICD-10-CM

## 2015-01-09 MED ORDER — METFORMIN HCL ER 500 MG PO TB24: ORAL_TABLET | ORAL | Status: DC

## 2015-01-09 MED ORDER — LIRAGLUTIDE 18 MG/3ML ~~LOC~~ SOPN: PEN_INJECTOR | SUBCUTANEOUS | Status: DC

## 2015-01-09 NOTE — Progress Notes (Signed)
Patient ID: Greg Kane, male   DOB: January 27, 1966, 49 y.o.   MRN: [id number]   Reason for Appointment :  Follow up of Type 2 Diabetes  History of Present Illness          Diagnosis: Type 2 diabetes mellitus, date of diagnosis 2008  Background information: Initial diagnosis was made incidentally and had only mild hyperglycemia. Initially was treated with metformin alone and then Actos was added He has not had any formal diabetes education and has had difficulty losing weight His blood sugars have been only fairly well controlled with A1c usually in the 7-8 range Because of inadequate control in 2013 he was given Victoza injection in addition. However Actos was stopped  He thinks this helped bring his blood sugars better controlled but helped his portion control only slightly He did not lose much weight with Victoza In 6/14 he was given Invokana in addition and his Victoza was resumed.  RECENT history   He has been on a regimen of Victoza, metformin, Invokana 300 mg and now Amaryl 1 mg at supper However he has an A1c of still over 7% His fasting blood sugars are persistently high although they are improved on an average compared to his last visit He had not been able to tolerate more than 1500 mg of metformin previously because of GI side effects He is very compliant with his medications and Victoza, now taking Victoza at night without any change in his fasting reading values  He has not been consistent with exercise, he thinks he has a busy schedule Current problems include inconsistent diet and less frequent formal exercise although he is trying to walk during his work hours Still has difficulty losing weight  He tends to get mildly hypoglycemic if he is late for lunch in the afternoon   Treatment regimen: Victoza 1.8mg , Invokana 300 and metformin 1500 mg  Glucose monitoring: Using One Touch meter less than once a day.     Blood Glucose readings from meter :   PRE-MEAL  Breakfast    2-4 PM  Bedtime Overall  Glucose range:  131-186    67-205  ?     Mean : 165    153    Physical activity: Walking for 2 miles, 0-1/7 days  Wt Readings from Last 3 Encounters:  01/09/15 286 lb (129.729 kg)  01/03/15 285 lb 9.6 oz (129.547 kg)  11/01/14 283 lb 6.4 oz (128.549 kg)       Lab Results  Component Value Date   HGBA1C 7.2* 01/06/2015   HGBA1C 7.4* 10/07/2014   HGBA1C 7.5* 06/26/2014   Lab Results  Component Value Date   MICROALBUR <0.7 01/06/2015   LDLCALC 162* 10/07/2014   CREATININE 1.05 01/06/2015     Lab Results  Component Value Date   MICROALBUR <0.7 01/06/2015   Lab on 01/06/2015  Component Date Value Ref Range Status  . Hgb A1c MFr Bld 01/06/2015 7.2* 4.6 - 6.5 % Final   Glycemic Control Guidelines for People with Diabetes:Non Diabetic:  <6%Goal of Therapy: <7%Additional Action Suggested:  >8%   . Sodium 01/06/2015 134* 135 - 145 mEq/L Final  . Potassium 01/06/2015 4.2  3.5 - 5.1 mEq/L Final  . Chloride 01/06/2015 105  96 - 112 mEq/L Final  . CO2 01/06/2015 22  19 - 32 mEq/L Final  . Glucose, Bld 01/06/2015 89  70 - 99 mg/dL Final  . BUN 01/06/2015 17  6 - 23 mg/dL Final  .  Creatinine, Ser 01/06/2015 1.05  0.40 - 1.50 mg/dL Final  . Calcium 01/06/2015 9.6  8.4 - 10.5 mg/dL Final  . GFR 01/06/2015 79.11  >60.00 mL/min Final  . Microalb, Ur 01/06/2015 <0.7  0.0 - 1.9 mg/dL Final  . Creatinine,U 01/06/2015 105.6   Final  . Microalb Creat Ratio 01/06/2015 0.7  0.0 - 30.0 mg/g Final         Medication List       This list is accurate as of: 01/09/15  1:32 PM.  Always use your most recent med list.               allopurinol 100 MG tablet  Commonly known as:  ZYLOPRIM  Take 1 tablet (100 mg total) by mouth 2 (two) times daily as needed. For gout     bisacodyl 5 MG EC tablet  Commonly known as:  DULCOLAX  Take 5 mg by mouth once. One time use for colonoscopy     fish oil-omega-3 fatty acids 1000 MG capsule  Take 1 g by mouth  daily.     gabapentin 300 MG capsule  Commonly known as:  NEURONTIN  Take 1 capsule (300 mg total) by mouth 3 (three) times daily.     glimepiride 1 MG tablet  Commonly known as:  AMARYL  Take 1 tablet (1 mg total) by mouth daily with breakfast.     Insulin Pen Needle 32G X 5 MM Misc  Commonly known as:  NOVOTWIST  USE AS DIRECTED     INVOKANA 300 MG Tabs tablet  Generic drug:  canagliflozin  TAKE 1 TABLET BY MOUTH DAILY BEFORE BREAKFAST     metFORMIN 500 MG 24 hr tablet  Commonly known as:  GLUCOPHAGE-XR  TAKE 3 TABLETS DAILY WITH BREAKFAST     metFORMIN 500 MG 24 hr tablet  Commonly known as:  GLUCOPHAGE-XR  TAKE 3 TABLETS DAILY WITH BREAKFAST (LABS ARE DUE NOW)     ONETOUCH VERIO test strip  Generic drug:  glucose blood  USE AS INSTRUCTED TO TEST BLOOD SUGARS TWICE DAILY     Pitavastatin Calcium 2 MG Tabs  1 po qhs     polyethylene glycol powder powder  Commonly known as:  GLYCOLAX/MIRALAX  Take 1 Container by mouth daily.     VICTOZA 18 MG/3ML Sopn  Generic drug:  Liraglutide  INJECT 1.8 MG DAILY. NEEDS OFFICE VISIT        Allergies:  Allergies  Allergen Reactions  . Penicillins Anaphylaxis  . Influenza Virus Vaccine Split     cellulitis  . Pneumococcal Vaccines Other (See Comments)    Cellulitis, swelling    Past Medical History  Diagnosis Date  . Allergy   . Hypertension   . Hyperlipidemia   . Gout   . Umbilical hernia   . Diabetes mellitus   . Bowel obstruction 10/2011    Past Surgical History  Procedure Laterality Date  . Appendectomy      1980s    Family History  Problem Relation Age of Onset  . Diabetes Mother   . Hyperlipidemia Father   . Hypertension Father   . Dementia Father   . Colon cancer Neg Hx   . Esophageal cancer Neg Hx   . Stomach cancer Neg Hx   . Rectal cancer Neg Hx     Social History:  reports that he has been smoking Cigarettes and Cigars.  He has been smoking about 0.25 packs per day. He has never used  smokeless  tobacco. He reports that he drinks alcohol. He reports that he does not use illicit drugs.    Review of Systems      Hyperlipidemia: He has mixed hyperlipidemia and was taking Lipitor 20 mg, had muscle pain and has stopped this for the last several months.   Has followed up with PCP for this LDL is significantly high as also triglycerides, this may be partly related to poor diet  Lab Results  Component Value Date   CHOL 245* 10/07/2014   HDL 38* 10/07/2014   LDLCALC 162* 10/07/2014   LDLDIRECT 183.0 04/03/2013   TRIG 224* 10/07/2014   CHOLHDL 6.4 10/07/2014        He had painful burning sensation in his feet especially in the evenings, previously treated with gabapentin.     Hypertension: He is not on any medications    He denies any symptoms of palpitations, heat intolerance, fatigue, weakness or shakiness His pulse rate is fast and has been fast on previous visits also No recent thyroid levels available  Lab Results  Component Value Date   TSH 1.998 11/01/2014      LABS:   Lab on 01/06/2015  Component Date Value Ref Range Status  . Hgb A1c MFr Bld 01/06/2015 7.2* 4.6 - 6.5 % Final   Glycemic Control Guidelines for People with Diabetes:Non Diabetic:  <6%Goal of Therapy: <7%Additional Action Suggested:  >8%   . Sodium 01/06/2015 134* 135 - 145 mEq/L Final  . Potassium 01/06/2015 4.2  3.5 - 5.1 mEq/L Final  . Chloride 01/06/2015 105  96 - 112 mEq/L Final  . CO2 01/06/2015 22  19 - 32 mEq/L Final  . Glucose, Bld 01/06/2015 89  70 - 99 mg/dL Final  . BUN 01/06/2015 17  6 - 23 mg/dL Final  . Creatinine, Ser 01/06/2015 1.05  0.40 - 1.50 mg/dL Final  . Calcium 01/06/2015 9.6  8.4 - 10.5 mg/dL Final  . GFR 01/06/2015 79.11  >60.00 mL/min Final  . Microalb, Ur 01/06/2015 <0.7  0.0 - 1.9 mg/dL Final  . Creatinine,U 01/06/2015 105.6   Final  . Microalb Creat Ratio 01/06/2015 0.7  0.0 - 30.0 mg/g Final    Physical Examination:  BP 110/80 mmHg  Pulse 84   Temp(Src) 98 F (36.7 C) (Oral)  Wt 286 lb (129.729 kg)    Diabetic foot exam shows normal monofilament sensation in the toes and plantar surfaces, no skin lesions or ulcers on the feet and normal pedal pulses No edema  ASSESSMENT/PLAN:   Diabetes type 2, uncontrolled   His blood sugars are still high in the morning despite starting Amaryl 1 mg at suppertime; they are slightly better compared to the last visit He is not monitoring after evening meals as directed Has not been consistent with exercise although he thinks he is active during the day Has gained some more weight Multiple drugs currently without adequate control especially fasting readings but not clear if his readings are high after supper are not He thinks he can do better with diet also with portions and late night snacks A1c still over 7%  Recommendations made today:  He will continue his Amaryl in the evenings  He will need to check his blood sugars after supper at least a couple of times every week and call if these are persistently high  Try increasing metformin to 2000 mg  May need to consider basal insulin and fasting blood sugars and A1c continue to be high, discussed principles of bedtime insulin  and he agrees that this may be an option  He may check on the coverage for Saxenda which would be beneficial for weight loss, discussed how this works  More consistent exercise  Improve diet with portion control and bedtime snacks  Consider consultation with dietitian but he is not interested currently  Complications: Mild symptomatic neuropathy, currently well controlled symptoms.  No microalbuminuria   Hyperlipidemia: Followed by PCP  Currently starting on Livalo  Counseling time over 50% of today's 25 minute visit  Patient Instructions  Please check blood sugars at least half the time about 2 hours after any meal esp supper and 3 times per week on waking up.  Please bring blood sugar monitor to each  visit. Recommended blood sugar levels about 2 hours after meal is 140-180 and on waking up 90-130  Metformin 2 twice daily  Check coverage of Saxenda  Walk more   Counseling time over 50% of today's 25 minute visit  Kynzlie Hilleary 01/09/2015, 1:32 PM

## 2015-01-09 NOTE — Patient Instructions (Addendum)
Please check blood sugars at least half the time about 2 hours after any meal esp supper and 3 times per week on waking up.  Please bring blood sugar monitor to each visit. Recommended blood sugar levels about 2 hours after meal is 140-180 and on waking up 90-130  Metformin 2 twice daily  Check coverage of Saxenda  Walk more

## 2015-01-09 NOTE — Progress Notes (Signed)
Pre visit review using our clinic review tool, if applicable. No additional management support is needed unless otherwise documented below in the visit note. 

## 2015-01-17 ENCOUNTER — Ambulatory Visit (AMBULATORY_SURGERY_CENTER): Payer: BC Managed Care – PPO | Admitting: Internal Medicine

## 2015-01-17 ENCOUNTER — Encounter: Payer: Self-pay | Admitting: Internal Medicine

## 2015-01-17 VITALS — BP 112/65 | HR 73 | Temp 98.1°F | Resp 16 | Ht 70.0 in | Wt 285.0 lb

## 2015-01-17 DIAGNOSIS — D120 Benign neoplasm of cecum: Secondary | ICD-10-CM

## 2015-01-17 DIAGNOSIS — Z1211 Encounter for screening for malignant neoplasm of colon: Secondary | ICD-10-CM | POA: Diagnosis present

## 2015-01-17 LAB — GLUCOSE, CAPILLARY
GLUCOSE-CAPILLARY: 103 mg/dL — AB (ref 70–99)
Glucose-Capillary: 114 mg/dL — ABNORMAL HIGH (ref 70–99)

## 2015-01-17 MED ORDER — SODIUM CHLORIDE 0.9 % IV SOLN: 500.0000 mL | INTRAVENOUS | Status: DC

## 2015-01-17 NOTE — Patient Instructions (Addendum)
I found and removed one small polyp. I will let you know pathology results and when to have another routine colonoscopy by mail.  You also have a condition called diverticulosis - common and not usually a problem. Please read the handout provided.  I appreciate the opportunity to care for you. Gatha Mayer, MD, FACG   YOU HAD AN ENDOSCOPIC PROCEDURE TODAY AT [facility]:   Refer to the procedure report that was given to you for any specific questions about what was found during the examination.  If the procedure report does not answer your questions, please call your gastroenterologist to clarify.  If you requested that your care partner not be given the details of your procedure findings, then the procedure report has been included in a sealed envelope for you to review at your convenience later.  YOU SHOULD EXPECT: Some feelings of bloating in the abdomen. Passage of more gas than usual.  Walking can help get rid of the air that was put into your GI tract during the procedure and reduce the bloating. If you had a lower endoscopy (such as a colonoscopy or flexible sigmoidoscopy) you may notice spotting of blood in your stool or on the toilet paper. If you underwent a bowel prep for your procedure, you may not have a normal bowel movement for a few days.  Please Note:  You might notice some irritation and congestion in your nose or some drainage.  This is from the oxygen used during your procedure.  There is no need for concern and it should clear up in a day or so.  SYMPTOMS TO REPORT IMMEDIATELY:   Following lower endoscopy (colonoscopy or flexible sigmoidoscopy):  Excessive amounts of blood in the stool  Significant tenderness or worsening of abdominal pains  Swelling of the abdomen that is new, acute  Fever of 100F or higher   For urgent or emergent issues, a gastroenterologist can be reached at any hour by calling [phone].   DIET: Your first meal  following the procedure should be a small meal and then it is ok to progress to your normal diet. Heavy or fried foods are harder to digest and may make you feel nauseous or bloated.  Likewise, meals heavy in dairy and vegetables can increase bloating.  Drink plenty of fluids but you should avoid alcoholic beverages for 24 hours.  ACTIVITY:  You should plan to take it easy for the rest of today and you should NOT DRIVE or use heavy machinery until tomorrow (because of the sedation medicines used during the test).    FOLLOW UP: Our staff will call the number listed on your records the next business day following your procedure to check on you and address any questions or concerns that you may have regarding the information given to you following your procedure. If we do not reach you, we will leave a message.  However, if you are feeling well and you are not experiencing any problems, there is no need to return our call.  We will assume that you have returned to your regular daily activities without incident.  If any biopsies were taken you will be contacted by phone or by letter within the next 1-3 weeks.  Please call us at [phone] if you have not heard about the biopsies in 3 weeks.    SIGNATURES/CONFIDENTIALITY: You and/or your care partner have signed paperwork which will be entered into your electronic medical record.  These signatures attest  to the fact that that the information above on your After Visit Summary has been reviewed and is understood.  Full responsibility of the confidentiality of this discharge information lies with you and/or your care-partner. 

## 2015-01-17 NOTE — Op Note (Signed)
[facility] [place], [id number]   COLONOSCOPY PROCEDURE REPORT  PATIENT: Greg Kane, Greg Kane  MR#: [id number] BIRTHDATE: 1966-10-03 , 49  yrs. old GENDER: male ENDOSCOPIST: Gatha Mayer, MD, [facility] PROCEDURE DATE:  01/17/2015 PROCEDURE:   Colonoscopy, screening and Colonoscopy with snare polypectomy First Screening Colonoscopy - Avg.  risk and is 50 yrs.  old or older Yes.  Prior Negative Screening - Now for repeat screening. N/A  History of Adenoma - Now for follow-up colonoscopy & has been > or = to 3 yrs.  N/A ASA CLASS:   Class III INDICATIONS:Screening for colonic neoplasia and Colorectal Neoplasm Risk Assessment for this procedure is average risk. MEDICATIONS: Propofol 300 mg IV, Lidocaine 200 mg IV, and Monitored anesthesia care  DESCRIPTION OF PROCEDURE:   After the risks benefits and alternatives of the procedure were thoroughly explained, informed consent was obtained.  The digital rectal exam revealed no abnormalities of the rectum, revealed no prostatic nodules, and revealed the prostate was not enlarged.   The LB [id number] endoscope was introduced through the anus and advanced to the cecum, which was identified by both the appendix and ileocecal valve. No adverse events experienced.   The quality of the prep was good.  (MiraLax was used)  The instrument was then slowly withdrawn as the colon was fully examined.      COLON FINDINGS: A sessile polyp measuring 5 mm in size was found at the cecum.  A polypectomy was performed with a cold snare.  The resection was complete, the polyp tissue was completely retrieved and sent to histology.   There was mild diverticulosis noted in the sigmoid colon.   The examination was otherwise normal.  Retroflexed views revealed no abnormalities. The time to cecum = 1.6 Withdrawal time = 14.4   The scope was withdrawn and the procedure completed. COMPLICATIONS: There were no immediate  complications.  ENDOSCOPIC IMPRESSION: 1.   Sessile polyp (74mm) was found at the cecum; polypectomy was performed with a cold snare 2.   Mild diverticulosis was noted in the sigmoid colon 3.   The examination was otherwise normal  RECOMMENDATIONS: Timing of repeat colonoscopy will be determined by pathology findings.  eSigned:  Gatha Mayer, MD, [facility] 01/17/2015 8:40 AM   cc: The Patient and Garnet Koyanagi, DO

## 2015-01-17 NOTE — Progress Notes (Signed)
Called to room to assist during endoscopic procedure.  Patient ID and intended procedure confirmed with present staff. Received instructions for my participation in the procedure from the performing physician.  

## 2015-01-17 NOTE — Progress Notes (Signed)
Report to PACU, RN, vss, BBS= Clear.  

## 2015-01-20 ENCOUNTER — Telehealth: Payer: Self-pay | Admitting: *Deleted

## 2015-01-20 NOTE — Telephone Encounter (Signed)
  Follow up Call-  Call back number 01/17/2015  Post procedure Call Back phone  # [phone]  Permission to leave phone message Yes     Patient questions:  Do you have a fever, pain , or abdominal swelling? No. Pain Score  0 *  Have you tolerated food without any problems? Yes.    Have you been able to return to your normal activities? Yes.    Do you have any questions about your discharge instructions: Diet   No. Medications  No. Follow up visit  No.  Do you have questions or concerns about your Care? No.  Actions: * If pain score is 4 or above: No action needed, pain <4.

## 2015-01-22 ENCOUNTER — Encounter: Payer: Self-pay | Admitting: Internal Medicine

## 2015-01-22 DIAGNOSIS — Z860101 Personal history of adenomatous and serrated colon polyps: Secondary | ICD-10-CM | POA: Insufficient documentation

## 2015-01-22 DIAGNOSIS — Z86010 Personal history of colonic polyps: Secondary | ICD-10-CM

## 2015-01-22 HISTORY — DX: Personal history of colonic polyps: Z86.010

## 2015-01-22 HISTORY — DX: Personal history of adenomatous and serrated colon polyps: Z86.0101

## 2015-01-22 NOTE — Progress Notes (Signed)
Quick Note:  5 mm adenoma Repeat colonoscopy 2021 ______

## 2015-02-17 ENCOUNTER — Other Ambulatory Visit: Payer: Self-pay | Admitting: Endocrinology

## 2015-03-18 ENCOUNTER — Other Ambulatory Visit: Payer: Self-pay | Admitting: Family Medicine

## 2015-03-18 NOTE — Telephone Encounter (Signed)
Lipid, hep ---dx hyperlipidemia

## 2015-03-18 NOTE — Telephone Encounter (Signed)
30 day supply of Livalo sent to pharmacy. Pt last seen 10/2014 and advised 3 month follow up for labs. Is this lab visit only and if so, what labs does he need to have?

## 2015-04-08 ENCOUNTER — Other Ambulatory Visit (INDEPENDENT_AMBULATORY_CARE_PROVIDER_SITE_OTHER): Payer: BC Managed Care – PPO

## 2015-04-08 DIAGNOSIS — E1165 Type 2 diabetes mellitus with hyperglycemia: Secondary | ICD-10-CM | POA: Diagnosis not present

## 2015-04-08 DIAGNOSIS — IMO0002 Reserved for concepts with insufficient information to code with codable children: Secondary | ICD-10-CM

## 2015-04-08 LAB — BASIC METABOLIC PANEL
BUN: 17 mg/dL (ref 6–23)
CHLORIDE: 103 meq/L (ref 96–112)
CO2: 22 mEq/L (ref 19–32)
Calcium: 9.8 mg/dL (ref 8.4–10.5)
Creatinine, Ser: 1.06 mg/dL (ref 0.40–1.50)
GFR: 78.17 mL/min (ref >60.00)
Glucose, Bld: 106 mg/dL — ABNORMAL HIGH (ref 70–99)
POTASSIUM: 4.1 meq/L (ref 3.5–5.1)
SODIUM: 138 meq/L (ref 135–145)

## 2015-04-08 LAB — HEMOGLOBIN A1C: HEMOGLOBIN A1C: 6.6 % — AB (ref 4.6–6.5)

## 2015-04-11 ENCOUNTER — Ambulatory Visit (INDEPENDENT_AMBULATORY_CARE_PROVIDER_SITE_OTHER): Payer: BC Managed Care – PPO | Admitting: Endocrinology

## 2015-04-11 ENCOUNTER — Encounter: Payer: Self-pay | Admitting: Endocrinology

## 2015-04-11 ENCOUNTER — Other Ambulatory Visit: Payer: Self-pay | Admitting: *Deleted

## 2015-04-11 VITALS — BP 110/80 | HR 110 | Temp 98.2°F | Resp 16 | Ht 70.0 in | Wt 278.2 lb

## 2015-04-11 DIAGNOSIS — E782 Mixed hyperlipidemia: Secondary | ICD-10-CM

## 2015-04-11 DIAGNOSIS — E119 Type 2 diabetes mellitus without complications: Secondary | ICD-10-CM

## 2015-04-11 MED ORDER — GLUCOSE BLOOD VI STRP: ORAL_STRIP | Status: DC

## 2015-04-11 NOTE — Progress Notes (Signed)
Patient ID: Greg Kane, male   DOB: 1966-02-28, 49 y.o.   MRN: [id number]   Reason for Appointment :  Follow up of Type 2 Diabetes  History of Present Illness          Diagnosis: Type 2 diabetes mellitus, date of diagnosis 2008  Background information: Initial diagnosis was made incidentally and had only mild hyperglycemia. Initially was treated with metformin alone and then Actos was added He has not had any formal diabetes education and has had difficulty losing weight His blood sugars have been only fairly well controlled with A1c usually in the 7-8 range Because of inadequate control in 2013 he was given Victoza injection in addition. However Actos was stopped  He thinks this helped bring his blood sugars better controlled but helped his portion control only slightly He did not lose much weight with Victoza In 6/14 he was given Invokana in addition and his Victoza was resumed.  RECENT history   He has been on a regimen of Victoza, metformin, Invokana 300 mg and Amaryl 1 mg at supper He usually has difficulty with losing weight and getting consistent control of his diabetes despite multiple drugs  However on this visit his A1c has come down to 6.6%, usually has been over 7% He has also lost 7 pounds He thinks that he has been trying to be particular with his diet over the last 3 months although did go off on his vacation He has been trying to walk more regularly whenever he can including at work  Recent blood sugar patterns and problems identified:   his fasting blood sugars tend to be high although on average he thinks they are better  Did not bring his monitor for download but he thinks that on the day of his lab in his glucose was 106 his fasting glucose 3 hours before was 206  He also has done a few readings after meals and they are not consistently high  His blood sugars tend to be low normal in the late afternoon, once had shakiness with a blood sugar of  about 80  Previously was taking Victoza at night but now doing it in the morning   Treatment regimen: Victoza 1.8mg , Invokana 300 and metformin 1500 mg  Glucose monitoring: Using One Touch meter less than once a day.     Blood Glucose readings from recall:   PRE-MEAL Fasting Lunch Dinner Bedtime Overall  Glucose range: 130-206  80+ 140-170   Mean/median: 140       Physical activity: Walking for up to 2 miles,  1-4/7 days  Wt Readings from Last 3 Encounters:  04/11/15 278 lb 3.2 oz (126.191 kg)  01/17/15 285 lb (129.275 kg)  01/09/15 286 lb (129.729 kg)      Lab Results  Component Value Date   HGBA1C 6.6* 04/08/2015   HGBA1C 7.2* 01/06/2015   HGBA1C 7.4* 10/07/2014   Lab Results  Component Value Date   MICROALBUR <0.7 01/06/2015   LDLCALC 162* 10/07/2014   CREATININE 1.06 04/08/2015     Lab Results  Component Value Date   MICROALBUR <0.7 01/06/2015   Lab on 04/08/2015  Component Date Value Ref Range Status  . Hgb A1c MFr Bld 04/08/2015 6.6* 4.6 - 6.5 % Final   Glycemic Control Guidelines for People with Diabetes:Non Diabetic:  <6%Goal of Therapy: <7%Additional Action Suggested:  >8%   . Sodium 04/08/2015 138  135 - 145 mEq/L Final  . Potassium 04/08/2015 4.1  3.5 -  5.1 mEq/L Final  . Chloride 04/08/2015 103  96 - 112 mEq/L Final  . CO2 04/08/2015 22  19 - 32 mEq/L Final  . Glucose, Bld 04/08/2015 106* 70 - 99 mg/dL Final  . BUN 04/08/2015 17  6 - 23 mg/dL Final  . Creatinine, Ser 04/08/2015 1.06  0.40 - 1.50 mg/dL Final  . Calcium 04/08/2015 9.8  8.4 - 10.5 mg/dL Final  . GFR 04/08/2015 78.17  >60.00 mL/min Final         Medication List       This list is accurate as of: 04/11/15  5:00 PM.  Always use your most recent med list.               allopurinol 100 MG tablet  Commonly known as:  ZYLOPRIM  Take 1 tablet (100 mg total) by mouth 2 (two) times daily as needed. For gout     fish oil-omega-3 fatty acids 1000 MG capsule  Take 1 g by mouth daily.       gabapentin 300 MG capsule  Commonly known as:  NEURONTIN  Take 1 capsule (300 mg total) by mouth 3 (three) times daily.     glimepiride 1 MG tablet  Commonly known as:  AMARYL  Take 1 tablet (1 mg total) by mouth daily with breakfast.     glucose blood test strip  Commonly known as:  BAYER CONTOUR NEXT TEST  Use as instructed to check blood sugar 2 times per day dx code E11.59     Insulin Pen Needle 32G X 5 MM Misc  Commonly known as:  NOVOTWIST  USE AS DIRECTED     INVOKANA 300 MG Tabs tablet  Generic drug:  canagliflozin  TAKE 1 TABLET BY MOUTH DAILY BEFORE BREAKFAST     Liraglutide 18 MG/3ML Sopn  Commonly known as:  VICTOZA  INJECT 1.8 MG DAILY.     LIVALO 2 MG Tabs  Generic drug:  Pitavastatin Calcium  TAKE 1 TABLET BY MOUTH EVERY NIGHT AT BEDTIME     metFORMIN 500 MG 24 hr tablet  Commonly known as:  GLUCOPHAGE-XR  2 tablets twice daily     polyethylene glycol powder powder  Commonly known as:  GLYCOLAX/MIRALAX  Take 1 Container by mouth daily.        Allergies:  Allergies  Allergen Reactions  . Penicillins Anaphylaxis  . Influenza Virus Vaccine Split     cellulitis  . Pneumococcal Vaccines Other (See Comments)    Cellulitis, swelling    Past Medical History  Diagnosis Date  . Allergy   . Hypertension   . Hyperlipidemia   . Gout   . Umbilical hernia   . Diabetes mellitus   . Bowel obstruction 10/2011  . Hx of adenomatous polyp of colon 01/22/2015    Past Surgical History  Procedure Laterality Date  . Appendectomy      1980s    Family History  Problem Relation Age of Onset  . Diabetes Mother   . Hyperlipidemia Father   . Hypertension Father   . Dementia Father   . Colon cancer Neg Hx   . Esophageal cancer Neg Hx   . Stomach cancer Neg Hx   . Rectal cancer Neg Hx     Social History:  reports that he has been smoking Cigarettes and Cigars.  He has been smoking about 0.25 packs per day. He has never used smokeless tobacco. He  reports that he drinks alcohol. He reports that he does not  use illicit drugs.    Review of Systems      Hyperlipidemia: He has mixed hyperlipidemia and was taking Lipitor 20 mg, had muscle pain and has stopped this for the last several months.   Has not followed up with PCP for this after being started on Livalo about 3 months ago which he is tolerating LDL is significantly high as also triglycerides  Lab Results  Component Value Date   CHOL 245* 10/07/2014   HDL 38* 10/07/2014   LDLCALC 162* 10/07/2014   LDLDIRECT 183.0 04/03/2013   TRIG 224* 10/07/2014   CHOLHDL 6.4 10/07/2014        He had painful burning sensation in his feet especially in the evenings, previously treated with gabapentin.  Foot exam was normal in 4/16  BLOOD PRESSURE: He is not on any medications    He has been evaluated for sinus tachycardia by PCP and no etiology found     LABS:   Lab on 04/08/2015  Component Date Value Ref Range Status  . Hgb A1c MFr Bld 04/08/2015 6.6* 4.6 - 6.5 % Final   Glycemic Control Guidelines for People with Diabetes:Non Diabetic:  <6%Goal of Therapy: <7%Additional Action Suggested:  >8%   . Sodium 04/08/2015 138  135 - 145 mEq/L Final  . Potassium 04/08/2015 4.1  3.5 - 5.1 mEq/L Final  . Chloride 04/08/2015 103  96 - 112 mEq/L Final  . CO2 04/08/2015 22  19 - 32 mEq/L Final  . Glucose, Bld 04/08/2015 106* 70 - 99 mg/dL Final  . BUN 04/08/2015 17  6 - 23 mg/dL Final  . Creatinine, Ser 04/08/2015 1.06  0.40 - 1.50 mg/dL Final  . Calcium 04/08/2015 9.8  8.4 - 10.5 mg/dL Final  . GFR 04/08/2015 78.17  >60.00 mL/min Final    Physical Examination:  BP 110/80 mmHg  Pulse 110  Temp(Src) 98.2 F (36.8 C)  Resp 16  Ht 5\' 10"  (1.778 m)  Wt 278 lb 3.2 oz (126.191 kg)  BMI 39.92 kg/m2  SpO2 96%     ASSESSMENT/PLAN:   Diabetes type 2, uncontrolled   His blood sugars are finally improved with A1c below 7% He has lost some weight and this may be related to both being  more active and also generally watching his diet He still tends to have higher readings when he wakes up in the morning Unable to review his monitor today for download  Recommendations made today:  He will continue same regimen except take Victoza in the evening although previously this had not improved his fasting readings  Consistent monitoring after meals and bring monitor for download   Continue efforts to lose weight   Hyperlipidemia: Followed by PCP He needs follow-up labs since starting on Livalo  Greg Kane 04/11/2015, 5:00 PM

## 2015-04-21 ENCOUNTER — Other Ambulatory Visit: Payer: Self-pay | Admitting: Endocrinology

## 2015-04-28 ENCOUNTER — Other Ambulatory Visit: Payer: Self-pay | Admitting: *Deleted

## 2015-04-28 MED ORDER — GLIMEPIRIDE 1 MG PO TABS: 1.0000 mg | ORAL_TABLET | Freq: Every day | ORAL | Status: DC

## 2015-06-17 ENCOUNTER — Other Ambulatory Visit: Payer: Self-pay | Admitting: *Deleted

## 2015-06-17 MED ORDER — CANAGLIFLOZIN 300 MG PO TABS: ORAL_TABLET | ORAL | Status: DC

## 2015-06-22 ENCOUNTER — Other Ambulatory Visit: Payer: Self-pay | Admitting: Endocrinology

## 2015-07-01 ENCOUNTER — Other Ambulatory Visit: Payer: Self-pay | Admitting: *Deleted

## 2015-07-01 ENCOUNTER — Telehealth: Payer: Self-pay | Admitting: Endocrinology

## 2015-07-01 MED ORDER — GABAPENTIN 300 MG PO CAPS: ORAL_CAPSULE | ORAL | Status: AC

## 2015-07-01 NOTE — Telephone Encounter (Signed)
Patient need a refill of Gabapentin 300 mg.

## 2015-07-01 NOTE — Telephone Encounter (Signed)
rx sent

## 2015-07-02 ENCOUNTER — Other Ambulatory Visit: Payer: Self-pay | Admitting: Sports Medicine

## 2015-07-02 DIAGNOSIS — M545 Low back pain: Secondary | ICD-10-CM

## 2015-07-09 ENCOUNTER — Other Ambulatory Visit (INDEPENDENT_AMBULATORY_CARE_PROVIDER_SITE_OTHER): Payer: BC Managed Care – PPO

## 2015-07-09 DIAGNOSIS — E119 Type 2 diabetes mellitus without complications: Secondary | ICD-10-CM

## 2015-07-09 DIAGNOSIS — E782 Mixed hyperlipidemia: Secondary | ICD-10-CM

## 2015-07-09 LAB — LIPID PANEL
CHOL/HDL RATIO: 6
Cholesterol: 264 mg/dL — ABNORMAL HIGH (ref 0–200)
HDL: 44.3 mg/dL (ref >39.00)
LDL CALC: 189 mg/dL — AB (ref 0–99)
NONHDL: 219.24
Triglycerides: 153 mg/dL — ABNORMAL HIGH (ref 0.0–149.0)
VLDL: 30.6 mg/dL (ref 0.0–40.0)

## 2015-07-09 LAB — COMPREHENSIVE METABOLIC PANEL
ALT: 38 U/L (ref 0–53)
AST: 21 U/L (ref 0–37)
Albumin: 4.5 g/dL (ref 3.5–5.2)
Alkaline Phosphatase: 50 U/L (ref 39–117)
BUN: 17 mg/dL (ref 6–23)
CO2: 27 meq/L (ref 19–32)
Calcium: 9.7 mg/dL (ref 8.4–10.5)
Chloride: 102 mEq/L (ref 96–112)
Creatinine, Ser: 0.97 mg/dL (ref 0.40–1.50)
GFR: 86.52 mL/min (ref >60.00)
GLUCOSE: 104 mg/dL — AB (ref 70–99)
POTASSIUM: 4.2 meq/L (ref 3.5–5.1)
SODIUM: 136 meq/L (ref 135–145)
Total Bilirubin: 0.6 mg/dL (ref 0.2–1.2)
Total Protein: 7.4 g/dL (ref 6.0–8.3)

## 2015-07-09 LAB — HEMOGLOBIN A1C: HEMOGLOBIN A1C: 7.1 % — AB (ref 4.6–6.5)

## 2015-07-10 ENCOUNTER — Ambulatory Visit
Admission: RE | Admit: 2015-07-10 | Discharge: 2015-07-10 | Disposition: A | Discharge status: Home / Self Care | Payer: BC Managed Care – PPO | Source: Ambulatory Visit | Attending: Sports Medicine | Admitting: Sports Medicine

## 2015-07-10 DIAGNOSIS — M545 Low back pain: Secondary | ICD-10-CM

## 2015-07-14 ENCOUNTER — Encounter: Payer: Self-pay | Admitting: Endocrinology

## 2015-07-14 ENCOUNTER — Ambulatory Visit (INDEPENDENT_AMBULATORY_CARE_PROVIDER_SITE_OTHER): Payer: BC Managed Care – PPO | Admitting: Endocrinology

## 2015-07-14 VITALS — BP 123/94 | HR 104 | Temp 98.8°F | Resp 16 | Ht 70.0 in | Wt 281.4 lb

## 2015-07-14 DIAGNOSIS — E1165 Type 2 diabetes mellitus with hyperglycemia: Secondary | ICD-10-CM

## 2015-07-14 DIAGNOSIS — E782 Mixed hyperlipidemia: Secondary | ICD-10-CM | POA: Diagnosis not present

## 2015-07-14 MED ORDER — PITAVASTATIN CALCIUM 2 MG PO TABS: 1.0000 | ORAL_TABLET | Freq: Every day | ORAL | Status: DC

## 2015-07-14 NOTE — Progress Notes (Signed)
Patient ID: Greg Kane, male   DOB: 24-Oct-1965, 49 y.o.   MRN: [id number]   Reason for Appointment :  Follow up of Type 2 Diabetes  History of Present Illness          Diagnosis: Type 2 diabetes mellitus, date of diagnosis 2008  Background information: Initial diagnosis was made incidentally and had only mild hyperglycemia. Initially was treated with metformin alone and then Actos was added He has not had any formal diabetes education and has had difficulty losing weight His blood sugars have been only fairly well controlled with A1c usually in the 7-8 range Because of inadequate control in 2013 he was given Victoza injection in addition. However Actos was stopped  He thinks this helped bring his blood sugars better controlled but helped his portion control only slightly He did not lose much weight with Victoza In 6/14 he was given Invokana in addition and his Victoza was resumed.  RECENT history    Treatment regimen: Victoza 1.8mg , Invokana 300 and metformin 1500 mg  He usually has difficulty with  getting consistent control of his diabetes despite multiple drugs Previously also on Amaryl but this was stopped when his morning sugars improved Although on his last visit he had lost 7 pounds and A1c was 6.6, his A1c appears relatively higher at 7.1 Again he did not bring his monitor for download  Recent blood sugar patterns and problems identified:   his fasting blood sugars tend to be high although on average he thinks they are about the same  He only checks readings at night rarely and not clear if they are consistently high  He did have 3 injections of epidural steroids but does not know if his sugars were higher with this  Not able to exercise because of his persistent back pain  Glucose monitoring: Using One Touch meter less than once a day.     Blood Glucose readings from recall:  Mean values apply above for all meters except median for One Touch  PRE-MEAL  Fasting Lunch Dinner Bedtime Overall  Glucose range: 135-174   150   Mean/median: 140        Physical activity: None, Was walking for up to 2 miles,  1-4/7 days  Wt Readings from Last 3 Encounters:  07/14/15 281 lb 6.4 oz (127.642 kg)  04/11/15 278 lb 3.2 oz (126.191 kg)  01/17/15 285 lb (129.275 kg)      Lab Results  Component Value Date   HGBA1C 7.1* 07/09/2015   HGBA1C 6.6* 04/08/2015   HGBA1C 7.2* 01/06/2015   Lab Results  Component Value Date   MICROALBUR <0.7 01/06/2015   LDLCALC 189* 07/09/2015   CREATININE 0.97 07/09/2015     Lab Results  Component Value Date   MICROALBUR <0.7 01/06/2015   Appointment on 07/09/2015  Component Date Value Ref Range Status  . Hgb A1c MFr Bld 07/09/2015 7.1* 4.6 - 6.5 % Final   Glycemic Control Guidelines for People with Diabetes:Non Diabetic:  <6%Goal of Therapy: <7%Additional Action Suggested:  >8%   . Sodium 07/09/2015 136  135 - 145 mEq/L Final  . Potassium 07/09/2015 4.2  3.5 - 5.1 mEq/L Final  . Chloride 07/09/2015 102  96 - 112 mEq/L Final  . CO2 07/09/2015 27  19 - 32 mEq/L Final  . Glucose, Bld 07/09/2015 104* 70 - 99 mg/dL Final  . BUN 07/09/2015 17  6 - 23 mg/dL Final  . Creatinine, Ser 07/09/2015 0.97  0.40 -  1.50 mg/dL Final  . Total Bilirubin 07/09/2015 0.6  0.2 - 1.2 mg/dL Final  . Alkaline Phosphatase 07/09/2015 50  39 - 117 U/L Final  . AST 07/09/2015 21  0 - 37 U/L Final  . ALT 07/09/2015 38  0 - 53 U/L Final  . Total Protein 07/09/2015 7.4  6.0 - 8.3 g/dL Final  . Albumin 07/09/2015 4.5  3.5 - 5.2 g/dL Final  . Calcium 07/09/2015 9.7  8.4 - 10.5 mg/dL Final  . GFR 07/09/2015 86.52  >60.00 mL/min Final  . Cholesterol 07/09/2015 264* 0 - 200 mg/dL Final   ATP III Classification       Desirable:  < 200 mg/dL               Borderline High:  200 - 239 mg/dL          High:  > = 240 mg/dL  . Triglycerides 07/09/2015 153.0* 0.0 - 149.0 mg/dL Final   Normal:  <150 mg/dLBorderline High:  150 - 199 mg/dL  . HDL  07/09/2015 44.30  >39.00 mg/dL Final  . VLDL 07/09/2015 30.6  0.0 - 40.0 mg/dL Final  . LDL Cholesterol 07/09/2015 189* 0 - 99 mg/dL Final  . Total CHOL/HDL Ratio 07/09/2015 6   Final                  Men          Women1/2 Average Risk     3.4          3.3Average Risk          5.0          4.42X Average Risk          9.6          7.13X Average Risk          15.0          11.0                      . NonHDL 07/09/2015 219.24   Final   NOTE:  Non-HDL goal should be 30 mg/dL higher than patient's LDL goal (i.e. LDL goal of < 70 mg/dL, would have non-HDL goal of < 100 mg/dL)         Medication List       This list is accurate as of: 07/14/15 11:59 PM.  Always use your most recent med list.               allopurinol 100 MG tablet  Commonly known as:  ZYLOPRIM  Take 1 tablet (100 mg total) by mouth 2 (two) times daily as needed. For gout     canagliflozin 300 MG Tabs tablet  Commonly known as:  INVOKANA  TAKE 1 TABLET BY MOUTH DAILY BEFORE BREAKFAST     fish oil-omega-3 fatty acids 1000 MG capsule  Take 1 g by mouth daily.     gabapentin 300 MG capsule  Commonly known as:  NEURONTIN  Take 1 capsule 3 times a day as needed     glucose blood test strip  Commonly known as:  BAYER CONTOUR NEXT TEST  Use as instructed to check blood sugar 2 times per day dx code E11.59     ONETOUCH VERIO test strip  Generic drug:  glucose blood  TEST BLOOD SUGARS TWICE DAILY AS DIRECTED     HYDROcodone-acetaminophen 5-325 MG tablet  Commonly known as:  NORCO/VICODIN     Insulin Pen  Needle 32G X 5 MM Misc  Commonly known as:  NOVOTWIST  USE AS DIRECTED     metFORMIN 500 MG 24 hr tablet  Commonly known as:  GLUCOPHAGE-XR  2 tablets twice daily     Pitavastatin Calcium 2 MG Tabs  Commonly known as:  LIVALO  Take 1 tablet (2 mg total) by mouth at bedtime.     polyethylene glycol powder powder  Commonly known as:  GLYCOLAX/MIRALAX  Take 1 Container by mouth daily.     VICTOZA 18 MG/3ML  Sopn  Generic drug:  Liraglutide  INJECT 1.8 MG DAILY        Allergies:  Allergies  Allergen Reactions  . Penicillins Anaphylaxis  . Influenza Virus Vaccine Split     cellulitis  . Pneumococcal Vaccines Other (See Comments)    Cellulitis, swelling    Past Medical History  Diagnosis Date  . Allergy   . Hypertension   . Hyperlipidemia   . Gout   . Umbilical hernia   . Diabetes mellitus   . Bowel obstruction ([place]) 10/2011  . Hx of adenomatous polyp of colon 01/22/2015    Past Surgical History  Procedure Laterality Date  . Appendectomy      1980s    Family History  Problem Relation Age of Onset  . Diabetes Mother   . Hyperlipidemia Father   . Hypertension Father   . Dementia Father   . Colon cancer Neg Hx   . Esophageal cancer Neg Hx   . Stomach cancer Neg Hx   . Rectal cancer Neg Hx     Social History:  reports that he has been smoking Cigarettes and Cigars.  He has been smoking about 0.25 packs per day. He has never used smokeless tobacco. He reports that he drinks alcohol. He reports that he does not use illicit drugs.    Review of Systems      Hyperlipidemia: He has mixed hyperlipidemia and was taking Lipitor 20 mg, had muscle pain and has stopped this for the last several months.   Has not followed up with PCP for this after being started on Livalo previously Although he was tolerating this well he did not ask for a refill and his lipids are poorly controlled LDL is significantly high although triglycerides are relatively better  Lab Results  Component Value Date   CHOL 264* 07/09/2015   HDL 44.30 07/09/2015   LDLCALC 189* 07/09/2015   LDLDIRECT 183.0 04/03/2013   TRIG 153.0* 07/09/2015   CHOLHDL 6 07/09/2015        He had painful burning sensation in his feet especially in the evenings, previously treated with gabapentin.  Foot exam was normal in 4/16  BLOOD PRESSURE: He is not on any medications    He may be going in for lumbar disc surgery  because of persistent pain and right leg symptoms   LABS:   Appointment on 07/09/2015  Component Date Value Ref Range Status  . Hgb A1c MFr Bld 07/09/2015 7.1* 4.6 - 6.5 % Final   Glycemic Control Guidelines for People with Diabetes:Non Diabetic:  <6%Goal of Therapy: <7%Additional Action Suggested:  >8%   . Sodium 07/09/2015 136  135 - 145 mEq/L Final  . Potassium 07/09/2015 4.2  3.5 - 5.1 mEq/L Final  . Chloride 07/09/2015 102  96 - 112 mEq/L Final  . CO2 07/09/2015 27  19 - 32 mEq/L Final  . Glucose, Bld 07/09/2015 104* 70 - 99 mg/dL Final  . BUN 07/09/2015 17  6 - 23 mg/dL Final  . Creatinine, Ser 07/09/2015 0.97  0.40 - 1.50 mg/dL Final  . Total Bilirubin 07/09/2015 0.6  0.2 - 1.2 mg/dL Final  . Alkaline Phosphatase 07/09/2015 50  39 - 117 U/L Final  . AST 07/09/2015 21  0 - 37 U/L Final  . ALT 07/09/2015 38  0 - 53 U/L Final  . Total Protein 07/09/2015 7.4  6.0 - 8.3 g/dL Final  . Albumin 07/09/2015 4.5  3.5 - 5.2 g/dL Final  . Calcium 07/09/2015 9.7  8.4 - 10.5 mg/dL Final  . GFR 07/09/2015 86.52  >60.00 mL/min Final  . Cholesterol 07/09/2015 264* 0 - 200 mg/dL Final   ATP III Classification       Desirable:  < 200 mg/dL               Borderline High:  200 - 239 mg/dL          High:  > = 240 mg/dL  . Triglycerides 07/09/2015 153.0* 0.0 - 149.0 mg/dL Final   Normal:  <150 mg/dLBorderline High:  150 - 199 mg/dL  . HDL 07/09/2015 44.30  >39.00 mg/dL Final  . VLDL 07/09/2015 30.6  0.0 - 40.0 mg/dL Final  . LDL Cholesterol 07/09/2015 189* 0 - 99 mg/dL Final  . Total CHOL/HDL Ratio 07/09/2015 6   Final                  Men          Women1/2 Average Risk     3.4          3.3Average Risk          5.0          4.42X Average Risk          9.6          7.13X Average Risk          15.0          11.0                      . NonHDL 07/09/2015 219.24   Final   NOTE:  Non-HDL goal should be 30 mg/dL higher than patient's LDL goal (i.e. LDL goal of < 70 mg/dL, would have non-HDL goal of < 100  mg/dL)    Physical Examination:  BP 123/94 mmHg  Pulse 104  Temp(Src) 98.8 F (37.1 C)  Resp 16  Ht 5\' 10"  (1.778 m)  Wt 281 lb 6.4 oz (127.642 kg)  BMI 40.38 kg/m2  SpO2 96%     ASSESSMENT/PLAN:   Diabetes type 2, uncontrolled   His blood sugars are relatively higher with A1c 7.1 This is mostly because of his inability to exercise, chronic pain and some effect of epidural steroids Again difficult to analyze his home blood sugar patterns as he does not bring his monitor for download and does not usually check postprandial readings Lab glucose was excellent at 104 fasting  Recommendations made today:  He will continue same regimen  Consistent monitoring after meals and bring monitor for download  Restart efforts to lose weight with exercise once he has his back surgery   Hyperlipidemia: Followed by PCP but since he needs prescription for Livalo will restart this today   Patient Instructions  Check blood sugars on waking up .Marland Kitchen 3-4 .Marland Kitchen times a week Also check blood sugars about 2 hours after a meal and do this after different meals by rotation  Recommended blood sugar levels on waking up is 90-130 and about 2 hours after meal is 140-180 Please bring blood sugar monitor to each visit.      Cyler Kappes 07/15/2015, 4:56 PM

## 2015-07-14 NOTE — Patient Instructions (Signed)
Check blood sugars on waking up ..3-4  .. times a week Also check blood sugars about 2 hours after a meal and do this after different meals by rotation  Recommended blood sugar levels on waking up is 90-130 and about 2 hours after meal is 140-180 Please bring blood sugar monitor to each visit.  

## 2015-07-17 ENCOUNTER — Other Ambulatory Visit: Payer: BC Managed Care – PPO

## 2015-07-29 ENCOUNTER — Other Ambulatory Visit: Payer: Self-pay | Admitting: Endocrinology

## 2015-09-19 ENCOUNTER — Other Ambulatory Visit: Payer: Self-pay | Admitting: Endocrinology

## 2015-10-09 ENCOUNTER — Other Ambulatory Visit (INDEPENDENT_AMBULATORY_CARE_PROVIDER_SITE_OTHER): Payer: BC Managed Care – PPO

## 2015-10-09 DIAGNOSIS — E1165 Type 2 diabetes mellitus with hyperglycemia: Secondary | ICD-10-CM | POA: Diagnosis not present

## 2015-10-09 LAB — COMPREHENSIVE METABOLIC PANEL
ALBUMIN: 4.5 g/dL (ref 3.5–5.2)
ALK PHOS: 63 U/L (ref 39–117)
ALT: 40 U/L (ref 0–53)
AST: 29 U/L (ref 0–37)
BILIRUBIN TOTAL: 0.7 mg/dL (ref 0.2–1.2)
BUN: 15 mg/dL (ref 6–23)
CO2: 26 mEq/L (ref 19–32)
Calcium: 9.8 mg/dL (ref 8.4–10.5)
Chloride: 101 mEq/L (ref 96–112)
Creatinine, Ser: 1.06 mg/dL (ref 0.40–1.50)
GFR: 78.02 mL/min (ref >60.00)
GLUCOSE: 121 mg/dL — AB (ref 70–99)
Potassium: 4.1 mEq/L (ref 3.5–5.1)
Sodium: 135 mEq/L (ref 135–145)
TOTAL PROTEIN: 7.3 g/dL (ref 6.0–8.3)

## 2015-10-09 LAB — LIPID PANEL
CHOLESTEROL: 194 mg/dL (ref 0–200)
HDL: 28.2 mg/dL — AB (ref >39.00)
NonHDL: 165.48
Total CHOL/HDL Ratio: 7
Triglycerides: 219 mg/dL — ABNORMAL HIGH (ref 0.0–149.0)
VLDL: 43.8 mg/dL — ABNORMAL HIGH (ref 0.0–40.0)

## 2015-10-09 LAB — MICROALBUMIN / CREATININE URINE RATIO
CREATININE, U: 74.5 mg/dL
Microalb Creat Ratio: 0.9 mg/g (ref 0.0–30.0)

## 2015-10-09 LAB — LDL CHOLESTEROL, DIRECT: LDL DIRECT: 118 mg/dL

## 2015-10-09 LAB — HEMOGLOBIN A1C: HEMOGLOBIN A1C: 7.2 % — AB (ref 4.6–6.5)

## 2015-10-14 ENCOUNTER — Ambulatory Visit: Payer: BC Managed Care – PPO | Admitting: Endocrinology

## 2015-10-14 ENCOUNTER — Other Ambulatory Visit: Payer: Self-pay | Admitting: *Deleted

## 2015-10-14 ENCOUNTER — Ambulatory Visit (INDEPENDENT_AMBULATORY_CARE_PROVIDER_SITE_OTHER): Payer: BC Managed Care – PPO | Admitting: Endocrinology

## 2015-10-14 ENCOUNTER — Encounter: Payer: Self-pay | Admitting: Endocrinology

## 2015-10-14 VITALS — BP 116/68 | HR 105 | Temp 97.7°F | Resp 14 | Ht 70.0 in | Wt 284.6 lb

## 2015-10-14 DIAGNOSIS — E782 Mixed hyperlipidemia: Secondary | ICD-10-CM

## 2015-10-14 DIAGNOSIS — E1165 Type 2 diabetes mellitus with hyperglycemia: Secondary | ICD-10-CM

## 2015-10-14 MED ORDER — EMPAGLIFLOZIN 25 MG PO TABS: 25.0000 mg | ORAL_TABLET | Freq: Every day | ORAL | Status: DC

## 2015-10-14 MED ORDER — ROSUVASTATIN CALCIUM 10 MG PO TABS: 10.0000 mg | ORAL_TABLET | Freq: Every day | ORAL | Status: DC

## 2015-10-14 MED ORDER — GLUCOSE BLOOD VI STRP: ORAL_STRIP | Status: DC

## 2015-10-14 MED ORDER — ONETOUCH DELICA LANCETS 33G MISC: Status: DC

## 2015-10-14 NOTE — Progress Notes (Signed)
Patient ID: Greg Kane, male   DOB: [birth year], 50 y.o.   MRN: [id number]   Reason for Appointment :  Follow up of Type 2 Diabetes  History of Present Illness          Diagnosis: Type 2 diabetes mellitus, date of diagnosis 2008  Background information: Initial diagnosis was made incidentally and had only mild hyperglycemia. Initially was treated with metformin alone and then Actos was added He has not had any formal diabetes education and has had difficulty losing weight His blood sugars have been only fairly well controlled with A1c usually in the 7-8 range Because of inadequate control in 2013 he was given Victoza injection in addition. However Actos was stopped  He thinks this helped bring his blood sugars better controlled but helped his portion control only slightly He did not lose much weight with Victoza In 6/14 he was given Invokana in addition and his Victoza was resumed.  RECENT history    Treatment regimen: Victoza 1.8mg , Invokana 300 and metformin 1500 mg  He usually has difficulty with  getting consistent control of his diabetes despite multiple drugs Previously also on Amaryl but this was stopped when his morning sugars improved Again he did not bring his monitor for download  Recent blood sugar patterns and problems identified:   his fasting blood sugars tend to be higher than nonfasting readings although on average he thinks they are about the same  He has gained a little weight despite starting to walk a little  He thinks he is recently starting to improve his diet and wants to get his weight down  Glucose monitoring: Using One Touch meter less than once a day.     Blood Glucose readings from recall:  Mean values apply above for all meters except median for One Touch  PRE-MEAL Fasting Lunch Dinner Bedtime Overall  Glucose range:  90 110    Mean/median: 140   120    Physical activity:  walking for up to 2 miles   Wt Readings from Last 3  Encounters:  10/14/15 284 lb 9.6 oz (129.094 kg)  07/14/15 281 lb 6.4 oz (127.642 kg)  04/11/15 278 lb 3.2 oz (126.191 kg)      Lab Results  Component Value Date   HGBA1C 7.2* 10/09/2015   HGBA1C 7.1* 07/09/2015   HGBA1C 6.6* 04/08/2015   Lab Results  Component Value Date   MICROALBUR <0.7 10/09/2015   LDLCALC 189* 07/09/2015   CREATININE 1.06 10/09/2015     Lab Results  Component Value Date   MICROALBUR <0.7 10/09/2015   Lab on 10/09/2015  Component Date Value Ref Range Status  . Hgb A1c MFr Bld 10/09/2015 7.2* 4.6 - 6.5 % Final   Glycemic Control Guidelines for People with Diabetes:Non Diabetic:  <6%Goal of Therapy: <7%Additional Action Suggested:  >8%   . Sodium 10/09/2015 135  135 - 145 mEq/L Final  . Potassium 10/09/2015 4.1  3.5 - 5.1 mEq/L Final  . Chloride 10/09/2015 101  96 - 112 mEq/L Final  . CO2 10/09/2015 26  19 - 32 mEq/L Final  . Glucose, Bld 10/09/2015 121* 70 - 99 mg/dL Final  . BUN 10/09/2015 15  6 - 23 mg/dL Final  . Creatinine, Ser 10/09/2015 1.06  0.40 - 1.50 mg/dL Final  . Total Bilirubin 10/09/2015 0.7  0.2 - 1.2 mg/dL Final  . Alkaline Phosphatase 10/09/2015 63  39 - 117 U/L Final  . AST 10/09/2015 29  0 - 37  U/L Final  . ALT 10/09/2015 40  0 - 53 U/L Final  . Total Protein 10/09/2015 7.3  6.0 - 8.3 g/dL Final  . Albumin 10/09/2015 4.5  3.5 - 5.2 g/dL Final  . Calcium 10/09/2015 9.8  8.4 - 10.5 mg/dL Final  . GFR 10/09/2015 78.02  >60.00 mL/min Final  . Cholesterol 10/09/2015 194  0 - 200 mg/dL Final   ATP III Classification       Desirable:  < 200 mg/dL               Borderline High:  200 - 239 mg/dL          High:  > = 240 mg/dL  . Triglycerides 10/09/2015 219.0* 0.0 - 149.0 mg/dL Final   Normal:  <150 mg/dLBorderline High:  150 - 199 mg/dL  . HDL 10/09/2015 28.20* >39.00 mg/dL Final  . VLDL 10/09/2015 43.8* 0.0 - 40.0 mg/dL Final  . Total CHOL/HDL Ratio 10/09/2015 7   Final                  Men          Women1/2 Average Risk     3.4           3.3Average Risk          5.0          4.42X Average Risk          9.6          7.13X Average Risk          15.0          11.0                      . NonHDL 10/09/2015 165.48   Final   NOTE:  Non-HDL goal should be 30 mg/dL higher than patient's LDL goal (i.e. LDL goal of < 70 mg/dL, would have non-HDL goal of < 100 mg/dL)  . Microalb, Ur 10/09/2015 <0.7  0.0 - 1.9 mg/dL Final  . Creatinine,U 10/09/2015 74.5   Final  . Microalb Creat Ratio 10/09/2015 0.9  0.0 - 30.0 mg/g Final  . Direct LDL 10/09/2015 118.0   Final   Optimal:  <100 mg/dLNear or Above Optimal:  100-129 mg/dLBorderline High:  130-159 mg/dLHigh:  160-189 mg/dLVery High:  >190 mg/dL         Medication List       This list is accurate as of: 10/14/15  2:20 PM.  Always use your most recent med list.               allopurinol 100 MG tablet  Commonly known as:  ZYLOPRIM  Take 1 tablet (100 mg total) by mouth 2 (two) times daily as needed. For gout     empagliflozin 25 MG Tabs tablet  Commonly known as:  JARDIANCE  Take 25 mg by mouth daily.     fish oil-omega-3 fatty acids 1000 MG capsule  Take 1 g by mouth daily.     gabapentin 300 MG capsule  Commonly known as:  NEURONTIN  Take 1 capsule 3 times a day as needed     glucose blood test strip  Commonly known as:  ONE TOUCH ULTRA TEST  Use as instructed to check blood sugar 2 times daily dx code E11.29     HYDROcodone-acetaminophen 5-325 MG tablet  Commonly known as:  NORCO/VICODIN     Insulin Pen Needle 32G X 5 MM Misc  Commonly known as:  NOVOTWIST  USE AS DIRECTED     metFORMIN 500 MG 24 hr tablet  Commonly known as:  GLUCOPHAGE-XR  TAKE 2 TABLETS TWICE DAILY     ONETOUCH DELICA LANCETS 99991111 Misc  Use to check blood sugar 2 times per day dx code E11.65     Pitavastatin Calcium 2 MG Tabs  Commonly known as:  LIVALO  Take 1 tablet (2 mg total) by mouth at bedtime.     polyethylene glycol powder powder  Commonly known as:  GLYCOLAX/MIRALAX  Take 1  Container by mouth daily.     rosuvastatin 10 MG tablet  Commonly known as:  CRESTOR  Take 1 tablet (10 mg total) by mouth daily.     VICTOZA 18 MG/3ML Sopn  Generic drug:  Liraglutide  INJECT 1.8 MG DAILY        Allergies:  Allergies  Allergen Reactions  . Penicillins Anaphylaxis  . Influenza Virus Vaccine Split     cellulitis  . Pneumococcal Vaccines Other (See Comments)    Cellulitis, swelling    Past Medical History  Diagnosis Date  . Allergy   . Hypertension   . Hyperlipidemia   . Gout   . Umbilical hernia   . Diabetes mellitus   . Bowel obstruction ([place]) 10/2011  . Hx of adenomatous polyp of colon 01/22/2015    Past Surgical History  Procedure Laterality Date  . Appendectomy      1980s    Family History  Problem Relation Age of Onset  . Diabetes Mother   . Hyperlipidemia Father   . Hypertension Father   . Dementia Father   . Colon cancer Neg Hx   . Esophageal cancer Neg Hx   . Stomach cancer Neg Hx   . Rectal cancer Neg Hx     Social History:  reports that he has been smoking Cigarettes and Cigars.  He has been smoking about 0.25 packs per day. He has never used smokeless tobacco. He reports that he drinks alcohol. He reports that he does not use illicit drugs.    Review of Systems      Hyperlipidemia: He has mixed hyperlipidemia and was taking Lipitor 20 mg, had muscle pain and has stopped this for the last several months.   Has not followed up with PCP for this after being started on Livalo previously Although he was tolerating this well he was given a refill on his last visit but his lipids are still not well-controlled He is concerned about the cost of Livalo which is not covered now   Lab Results  Component Value Date   CHOL 194 10/09/2015   HDL 28.20* 10/09/2015   LDLCALC 189* 07/09/2015   LDLDIRECT 118.0 10/09/2015   TRIG 219.0* 10/09/2015   CHOLHDL 7 10/09/2015        He had painful burning sensation in his feet especially in the  evenings, previously treated with gabapentin.  Foot exam was normal in 4/16  He has done well with his back surgery and not in pain   LABS:   Lab on 10/09/2015  Component Date Value Ref Range Status  . Hgb A1c MFr Bld 10/09/2015 7.2* 4.6 - 6.5 % Final   Glycemic Control Guidelines for People with Diabetes:Non Diabetic:  <6%Goal of Therapy: <7%Additional Action Suggested:  >8%   . Sodium 10/09/2015 135  135 - 145 mEq/L Final  . Potassium 10/09/2015 4.1  3.5 - 5.1 mEq/L Final  . Chloride 10/09/2015 101  96 - 112 mEq/L Final  . CO2 10/09/2015 26  19 - 32 mEq/L Final  . Glucose, Bld 10/09/2015 121* 70 - 99 mg/dL Final  . BUN 10/09/2015 15  6 - 23 mg/dL Final  . Creatinine, Ser 10/09/2015 1.06  0.40 - 1.50 mg/dL Final  . Total Bilirubin 10/09/2015 0.7  0.2 - 1.2 mg/dL Final  . Alkaline Phosphatase 10/09/2015 63  39 - 117 U/L Final  . AST 10/09/2015 29  0 - 37 U/L Final  . ALT 10/09/2015 40  0 - 53 U/L Final  . Total Protein 10/09/2015 7.3  6.0 - 8.3 g/dL Final  . Albumin 10/09/2015 4.5  3.5 - 5.2 g/dL Final  . Calcium 10/09/2015 9.8  8.4 - 10.5 mg/dL Final  . GFR 10/09/2015 78.02  >60.00 mL/min Final  . Cholesterol 10/09/2015 194  0 - 200 mg/dL Final   ATP III Classification       Desirable:  < 200 mg/dL               Borderline High:  200 - 239 mg/dL          High:  > = 240 mg/dL  . Triglycerides 10/09/2015 219.0* 0.0 - 149.0 mg/dL Final   Normal:  <150 mg/dLBorderline High:  150 - 199 mg/dL  . HDL 10/09/2015 28.20* >39.00 mg/dL Final  . VLDL 10/09/2015 43.8* 0.0 - 40.0 mg/dL Final  . Total CHOL/HDL Ratio 10/09/2015 7   Final                  Men          Women1/2 Average Risk     3.4          3.3Average Risk          5.0          4.42X Average Risk          9.6          7.13X Average Risk          15.0          11.0                      . NonHDL 10/09/2015 165.48   Final   NOTE:  Non-HDL goal should be 30 mg/dL higher than patient's LDL goal (i.e. LDL goal of < 70 mg/dL, would have  non-HDL goal of < 100 mg/dL)  . Microalb, Ur 10/09/2015 <0.7  0.0 - 1.9 mg/dL Final  . Creatinine,U 10/09/2015 74.5   Final  . Microalb Creat Ratio 10/09/2015 0.9  0.0 - 30.0 mg/g Final  . Direct LDL 10/09/2015 118.0   Final   Optimal:  <100 mg/dLNear or Above Optimal:  100-129 mg/dLBorderline High:  130-159 mg/dLHigh:  160-189 mg/dLVery High:  >190 mg/dL    Physical Examination:  BP 116/68 mmHg  Pulse 105  Temp(Src) 97.7 F (36.5 C)  Resp 14  Ht 5\' 10"  (1.778 m)  Wt 284 lb 9.6 oz (129.094 kg)  BMI 40.84 kg/m2  SpO2 94%     ASSESSMENT/PLAN:   Diabetes type 2, uncontrolled   His blood sugars are relatively inadequately controlled with A1c is still over 7% See history of present illness for detailed discussion of his current management, blood sugar patterns and problems identified He does not report high readings at home but did not bring his meter today and he thinks his sugars are higher only fasting However he is now  starting to be more committed to following the diet and starting a walking program for exercise Lab glucose was 121  Recommendations made today:  He will continue same regimen except change Invokana to Jardiance because of insurance preference, co-pay card given  Consistent monitoring either fasting or after meals and bring monitor for download   Hyperlipidemia: Inadequately controlled and since he wants a generic medication will switch him to Crestor 10 mg   There are no Patient Instructions on file for this visit.   Rik Wadel 10/14/2015, 2:20 PM

## 2015-10-25 ENCOUNTER — Other Ambulatory Visit: Payer: Self-pay | Admitting: Endocrinology

## 2016-01-07 ENCOUNTER — Other Ambulatory Visit (INDEPENDENT_AMBULATORY_CARE_PROVIDER_SITE_OTHER): Payer: BC Managed Care – PPO

## 2016-01-07 DIAGNOSIS — E1165 Type 2 diabetes mellitus with hyperglycemia: Secondary | ICD-10-CM | POA: Diagnosis not present

## 2016-01-07 LAB — LIPID PANEL
Cholesterol: 130 mg/dL (ref 0–200)
HDL: 26.6 mg/dL — AB (ref >39.00)
LDL Cholesterol: 75 mg/dL (ref 0–99)
NONHDL: 103.4
Total CHOL/HDL Ratio: 5
Triglycerides: 140 mg/dL (ref 0.0–149.0)
VLDL: 28 mg/dL (ref 0.0–40.0)

## 2016-01-07 LAB — COMPREHENSIVE METABOLIC PANEL
ALBUMIN: 4.4 g/dL (ref 3.5–5.2)
ALT: 32 U/L (ref 0–53)
AST: 26 U/L (ref 0–37)
Alkaline Phosphatase: 54 U/L (ref 39–117)
BILIRUBIN TOTAL: 0.8 mg/dL (ref 0.2–1.2)
BUN: 16 mg/dL (ref 6–23)
CALCIUM: 9.7 mg/dL (ref 8.4–10.5)
CO2: 26 mEq/L (ref 19–32)
CREATININE: 1.03 mg/dL (ref 0.40–1.50)
Chloride: 104 mEq/L (ref 96–112)
GFR: 80.57 mL/min (ref >60.00)
Glucose, Bld: 122 mg/dL — ABNORMAL HIGH (ref 70–99)
Potassium: 4.3 mEq/L (ref 3.5–5.1)
Sodium: 138 mEq/L (ref 135–145)
Total Protein: 7 g/dL (ref 6.0–8.3)

## 2016-01-07 LAB — HEMOGLOBIN A1C: Hgb A1c MFr Bld: 8 % — ABNORMAL HIGH (ref 4.6–6.5)

## 2016-01-12 ENCOUNTER — Encounter: Payer: Self-pay | Admitting: Endocrinology

## 2016-01-12 ENCOUNTER — Ambulatory Visit (INDEPENDENT_AMBULATORY_CARE_PROVIDER_SITE_OTHER): Payer: BC Managed Care – PPO | Admitting: Endocrinology

## 2016-01-12 VITALS — BP 136/82 | HR 109 | Temp 98.1°F | Resp 14 | Ht 70.0 in | Wt 282.4 lb

## 2016-01-12 DIAGNOSIS — E1165 Type 2 diabetes mellitus with hyperglycemia: Secondary | ICD-10-CM

## 2016-01-12 DIAGNOSIS — E782 Mixed hyperlipidemia: Secondary | ICD-10-CM | POA: Diagnosis not present

## 2016-01-12 MED ORDER — GLIMEPIRIDE 1 MG PO TABS: 1.0000 mg | ORAL_TABLET | Freq: Every day | ORAL | Status: DC

## 2016-01-12 NOTE — Progress Notes (Signed)
Patient ID: Greg Kane, male   DOB: [birth year], 50 y.o.   MRN: [id number]   Reason for Appointment :  Follow up of Type 2 Diabetes  History of Present Illness          Diagnosis: Type 2 diabetes mellitus, date of diagnosis 2008  Background information: Initial diagnosis was made incidentally and had only mild hyperglycemia. Initially was treated with metformin alone and then Actos was added He has not had any formal diabetes education and has had difficulty losing weight His blood sugars have been only fairly well controlled with A1c usually in the 7-8 range Because of inadequate control in 2013 he was given Victoza injection in addition. However Actos was stopped  He thinks this helped bring his blood sugars better controlled but helped his portion control only slightly He did not lose much weight with Victoza In 6/14 he was given Invokana in addition and his Victoza was resumed.  RECENT history    Treatment regimen: Victoza 1.8mg , Jardiance 25 mg, metformin 2000 mg  He usually has difficulty with  getting consistent control of his diabetes despite multiple drugs Previously also on Amaryl but this was stopped in 2016 when his morning sugars improved Again he did not bring his monitor for download A1c has gone up  Recent blood sugar patterns and problems identified:  He thinks his fasting blood sugars started going up only about 2 weeks ago but for fairly good previously  He thinks his blood sugars after supper are fairly good also and only rarely on the high side  Not clear how often he is checking his blood sugars especially after meals  Although he has tried to do better with his diet and do regular walking his weight has come down only 3 pounds  His A1c has gone up, previously was better with Invokana which is not preferred by his insurance now  He has been compliant with his medications including metformin in the morning  Lab glucose was fairly good but he  checks his fasting glucose at 5:30 in the morning  Glucose monitoring: Using One Touch meter less than once a day.     Blood Glucose readings from recall:  Mean values apply above for all meters except median for One Touch  PRE-MEAL Fasting Lunch Dinner Bedtime Overall  Glucose range: 150-200  100 140-180   Mean/median:         Physical activity:  walking for up to 2 miles 3-4/7  Wt Readings from Last 3 Encounters:  01/12/16 282 lb 6.4 oz (128.096 kg)  10/14/15 284 lb 9.6 oz (129.094 kg)  07/14/15 281 lb 6.4 oz (127.642 kg)      Lab Results  Component Value Date   HGBA1C 8.0* 01/07/2016   HGBA1C 7.2* 10/09/2015   HGBA1C 7.1* 07/09/2015   Lab Results  Component Value Date   MICROALBUR <0.7 10/09/2015   [place] 75 01/07/2016   CREATININE 1.03 01/07/2016     Lab Results  Component Value Date   MICROALBUR <0.7 10/09/2015   Lab on 01/07/2016  Component Date Value Ref Range Status  . Hgb A1c MFr Bld 01/07/2016 8.0* 4.6 - 6.5 % Final   Glycemic Control Guidelines for People with Diabetes:Non Diabetic:  <6%Goal of Therapy: <7%Additional Action Suggested:  >8%   . Sodium 01/07/2016 138  135 - 145 mEq/L Final  . Potassium 01/07/2016 4.3  3.5 - 5.1 mEq/L Final  . Chloride 01/07/2016 104  96 - 112 mEq/L Final  .  CO2 01/07/2016 26  19 - 32 mEq/L Final  . Glucose, Bld 01/07/2016 122* 70 - 99 mg/dL Final  . BUN 01/07/2016 16  6 - 23 mg/dL Final  . Creatinine, Ser 01/07/2016 1.03  0.40 - 1.50 mg/dL Final  . Total Bilirubin 01/07/2016 0.8  0.2 - 1.2 mg/dL Final  . Alkaline Phosphatase 01/07/2016 54  39 - 117 U/L Final  . AST 01/07/2016 26  0 - 37 U/L Final  . ALT 01/07/2016 32  0 - 53 U/L Final  . Total Protein 01/07/2016 7.0  6.0 - 8.3 g/dL Final  . Albumin 01/07/2016 4.4  3.5 - 5.2 g/dL Final  . Calcium 01/07/2016 9.7  8.4 - 10.5 mg/dL Final  . GFR 01/07/2016 80.57  >60.00 mL/min Final  . Cholesterol 01/07/2016 130  0 - 200 mg/dL Final   ATP III Classification        Desirable:  < 200 mg/dL               Borderline High:  200 - 239 mg/dL          High:  > = 240 mg/dL  . Triglycerides 01/07/2016 140.0  0.0 - 149.0 mg/dL Final   Normal:  <150 mg/dLBorderline High:  150 - 199 mg/dL  . HDL 01/07/2016 26.60* >39.00 mg/dL Final  . VLDL 01/07/2016 28.0  0.0 - 40.0 mg/dL Final  . LDL Cholesterol 01/07/2016 75  0 - 99 mg/dL Final  . Total CHOL/HDL Ratio 01/07/2016 5   Final                  Men          Women1/2 Average Risk     3.4          3.3Average Risk          5.0          4.42X Average Risk          9.6          7.13X Average Risk          15.0          11.0                      . NonHDL 01/07/2016 103.40   Final   NOTE:  Non-HDL goal should be 30 mg/dL higher than patient's LDL goal (i.e. LDL goal of < 70 mg/dL, would have non-HDL goal of < 100 mg/dL)         Medication List       This list is accurate as of: 01/12/16 12:47 PM.  Always use your most recent med list.               allopurinol 100 MG tablet  Commonly known as:  ZYLOPRIM  Take 1 tablet (100 mg total) by mouth 2 (two) times daily as needed. For gout     empagliflozin 25 MG Tabs tablet  Commonly known as:  JARDIANCE  Take 25 mg by mouth daily.     fish oil-omega-3 fatty acids 1000 MG capsule  Take 1 g by mouth daily.     gabapentin 300 MG capsule  Commonly known as:  NEURONTIN  Take 1 capsule 3 times a day as needed     glimepiride 1 MG tablet  Commonly known as:  AMARYL  Take 1 tablet (1 mg total) by mouth daily with supper.     glucose blood test strip  Commonly known as:  ONE TOUCH ULTRA TEST  Use as instructed to check blood sugar 2 times daily dx code E11.29     HYDROcodone-acetaminophen 5-325 MG tablet  Commonly known as:  NORCO/VICODIN     Insulin Pen Needle 32G X 5 MM Misc  Commonly known as:  NOVOTWIST  USE AS DIRECTED     metFORMIN 500 MG 24 hr tablet  Commonly known as:  GLUCOPHAGE-XR  TAKE 2 TABLETS TWICE DAILY     ONETOUCH DELICA LANCETS 99991111 Misc    Use to check blood sugar 2 times per day dx code E11.65     polyethylene glycol powder powder  Commonly known as:  GLYCOLAX/MIRALAX  Take 1 Container by mouth daily.     rosuvastatin 10 MG tablet  Commonly known as:  CRESTOR  Take 1 tablet (10 mg total) by mouth daily.     VICTOZA 18 MG/3ML Sopn  Generic drug:  Liraglutide  INJECT 1.8 MG DAILY        Allergies:  Allergies  Allergen Reactions  . Penicillins Anaphylaxis  . Influenza Virus Vaccine Split     cellulitis  . Pneumococcal Vaccines Other (See Comments)    Cellulitis, swelling    Past Medical History  Diagnosis Date  . Allergy   . Hypertension   . Hyperlipidemia   . Gout   . Umbilical hernia   . Diabetes mellitus   . Bowel obstruction ([place]) 10/2011  . Hx of adenomatous polyp of colon 01/22/2015    Past Surgical History  Procedure Laterality Date  . Appendectomy      1980s    Family History  Problem Relation Age of Onset  . Diabetes Mother   . Hyperlipidemia Father   . Hypertension Father   . Dementia Father   . Colon cancer Neg Hx   . Esophageal cancer Neg Hx   . Stomach cancer Neg Hx   . Rectal cancer Neg Hx     Social History:  reports that he has been smoking Cigarettes and Cigars.  He has been smoking about 0.25 packs per day. He has never used smokeless tobacco. He reports that he drinks alcohol. He reports that he does not use illicit drugs.    Review of Systems      Hyperlipidemia: He has mixed hyperlipidemia and was taking Lipitor 20 mg, had muscle pain and has stopped this for the last several months.  He was concerned about the cost of Livalo and is now taking 10 mg Crestor which he is tolerating   Lab Results  Component Value Date   CHOL 130 01/07/2016   HDL 26.60* 01/07/2016   LDLCALC 75 01/07/2016   LDLDIRECT 118.0 10/09/2015   TRIG 140.0 01/07/2016   CHOLHDL 5 01/07/2016        He had painful burning sensation in his feet especially in the evenings, previously treated  with gabapentin, Resolved.  Foot exam was normal in 4/16  He has done well with his back surgery and not in pain   LABS:   Lab on 01/07/2016  Component Date Value Ref Range Status  . Hgb A1c MFr Bld 01/07/2016 8.0* 4.6 - 6.5 % Final   Glycemic Control Guidelines for People with Diabetes:Non Diabetic:  <6%Goal of Therapy: <7%Additional Action Suggested:  >8%   . Sodium 01/07/2016 138  135 - 145 mEq/L Final  . Potassium 01/07/2016 4.3  3.5 - 5.1 mEq/L Final  . Chloride 01/07/2016 104  96 - 112 mEq/L Final  .  CO2 01/07/2016 26  19 - 32 mEq/L Final  . Glucose, Bld 01/07/2016 122* 70 - 99 mg/dL Final  . BUN 01/07/2016 16  6 - 23 mg/dL Final  . Creatinine, Ser 01/07/2016 1.03  0.40 - 1.50 mg/dL Final  . Total Bilirubin 01/07/2016 0.8  0.2 - 1.2 mg/dL Final  . Alkaline Phosphatase 01/07/2016 54  39 - 117 U/L Final  . AST 01/07/2016 26  0 - 37 U/L Final  . ALT 01/07/2016 32  0 - 53 U/L Final  . Total Protein 01/07/2016 7.0  6.0 - 8.3 g/dL Final  . Albumin 01/07/2016 4.4  3.5 - 5.2 g/dL Final  . Calcium 01/07/2016 9.7  8.4 - 10.5 mg/dL Final  . GFR 01/07/2016 80.57  >60.00 mL/min Final  . Cholesterol 01/07/2016 130  0 - 200 mg/dL Final   ATP III Classification       Desirable:  < 200 mg/dL               Borderline High:  200 - 239 mg/dL          High:  > = 240 mg/dL  . Triglycerides 01/07/2016 140.0  0.0 - 149.0 mg/dL Final   Normal:  <150 mg/dLBorderline High:  150 - 199 mg/dL  . HDL 01/07/2016 26.60* >39.00 mg/dL Final  . VLDL 01/07/2016 28.0  0.0 - 40.0 mg/dL Final  . LDL Cholesterol 01/07/2016 75  0 - 99 mg/dL Final  . Total CHOL/HDL Ratio 01/07/2016 5   Final                  Men          Women1/2 Average Risk     3.4          3.3Average Risk          5.0          4.42X Average Risk          9.6          7.13X Average Risk          15.0          11.0                      . NonHDL 01/07/2016 103.40   Final   NOTE:  Non-HDL goal should be 30 mg/dL higher than patient's LDL goal (i.e.  LDL goal of < 70 mg/dL, would have non-HDL goal of < 100 mg/dL)    Physical Examination:  BP 136/82 mmHg  Pulse 109  Temp(Src) 98.1 F (36.7 C) (Oral)  Resp 14  Ht 5\' 10"  (1.778 m)  Wt 282 lb 6.4 oz (128.096 kg)  BMI 40.52 kg/m2  SpO2 97%     ASSESSMENT/PLAN:   Diabetes type 2, uncontrolled   His blood sugars are relatively inadequately controlled with A1c is Now 8% His meter was not downloaded again as he forgets to bring it even though he is reminded about this  See history of present illness for detailed discussion of his current management, blood sugar patterns and problems identified Although his blood sugars may be worse with Jardiance compared to [place] he thinks his readings are only high for the last 2 weeks significantly and mostly in the morning Reviewed his diet in general and appears to be appropriate Walking regularly Previously had done fairly well with Amaryl 1 mg in the evenings which he did not need subsequently  Recommendations made today:  He  will try Amaryl 1 mg at supper time  Consider basal insulin if morning sugars not controlled  He needs to bring his monitor for download and continue to monitor some readings after meals   Hyperlipidemia: Much better controlled with Crestor and triglycerides are also excellent   Patient Instructions  Check blood sugars on waking up  3 times a week Also check blood sugars about 2 hours after a meal and do this after different meals by rotation  Recommended blood sugar levels on waking up is 90-130 and about 2 hours after meal is 130-160  Please bring your blood sugar monitor to each visit, thank you      [facility] 01/12/2016, 12:47 PM

## 2016-01-12 NOTE — Patient Instructions (Signed)
Check blood sugars on waking up 3  times a week  Also check blood sugars about 2 hours after a meal and do this after different meals by rotation  Recommended blood sugar levels on waking up is 90-130 and about 2 hours after meal is 130-160  Please bring your blood sugar monitor to each visit, thank you  

## 2016-01-20 ENCOUNTER — Encounter: Payer: Self-pay | Admitting: Endocrinology

## 2016-01-20 ENCOUNTER — Other Ambulatory Visit: Payer: Self-pay | Admitting: *Deleted

## 2016-01-20 MED ORDER — METFORMIN HCL ER 500 MG PO TB24: 1000.0000 mg | ORAL_TABLET | Freq: Two times a day (BID) | ORAL | Status: DC

## 2016-01-27 ENCOUNTER — Encounter: Payer: Self-pay | Admitting: Family Medicine

## 2016-01-27 MED ORDER — ALLOPURINOL 100 MG PO TABS: 100.0000 mg | ORAL_TABLET | Freq: Two times a day (BID) | ORAL | Status: DC | PRN

## 2016-04-12 LAB — HM DIABETES EYE EXAM

## 2016-04-19 ENCOUNTER — Other Ambulatory Visit: Payer: Self-pay | Admitting: Endocrinology

## 2016-04-25 ENCOUNTER — Other Ambulatory Visit: Payer: Self-pay | Admitting: Endocrinology

## 2016-05-10 ENCOUNTER — Other Ambulatory Visit: Payer: BC Managed Care – PPO

## 2016-05-13 ENCOUNTER — Ambulatory Visit: Payer: BC Managed Care – PPO | Admitting: Endocrinology

## 2016-05-26 ENCOUNTER — Other Ambulatory Visit (INDEPENDENT_AMBULATORY_CARE_PROVIDER_SITE_OTHER): Payer: BC Managed Care – PPO

## 2016-05-26 DIAGNOSIS — E1165 Type 2 diabetes mellitus with hyperglycemia: Secondary | ICD-10-CM | POA: Diagnosis not present

## 2016-05-26 LAB — BASIC METABOLIC PANEL
BUN: 20 mg/dL (ref 6–23)
CHLORIDE: 103 meq/L (ref 96–112)
CO2: 27 meq/L (ref 19–32)
CREATININE: 1.17 mg/dL (ref 0.40–1.50)
Calcium: 9.3 mg/dL (ref 8.4–10.5)
GFR: 69.45 mL/min (ref >60.00)
Glucose, Bld: 138 mg/dL — ABNORMAL HIGH (ref 70–99)
Potassium: 4.6 mEq/L (ref 3.5–5.1)
SODIUM: 138 meq/L (ref 135–145)

## 2016-05-26 LAB — HEMOGLOBIN A1C: Hgb A1c MFr Bld: 7.2 % — ABNORMAL HIGH (ref 4.6–6.5)

## 2016-05-27 ENCOUNTER — Other Ambulatory Visit: Payer: BC Managed Care – PPO

## 2016-06-01 ENCOUNTER — Encounter: Payer: Self-pay | Admitting: Endocrinology

## 2016-06-01 ENCOUNTER — Ambulatory Visit (INDEPENDENT_AMBULATORY_CARE_PROVIDER_SITE_OTHER): Payer: BC Managed Care – PPO | Admitting: Endocrinology

## 2016-06-01 VITALS — BP 125/85 | HR 96 | Ht 70.0 in | Wt 278.0 lb

## 2016-06-01 DIAGNOSIS — E1101 Type 2 diabetes mellitus with hyperosmolarity with coma: Secondary | ICD-10-CM | POA: Diagnosis not present

## 2016-06-01 NOTE — Patient Instructions (Signed)
Change bedtime snack  Check blood sugars on waking up  3x per week  Also check blood sugars about 2 hours after a meal and do this after different meals by rotation  Recommended blood sugar levels on waking up is 90-130 and about 2 hours after meal is 130-160  Please bring your blood sugar monitor to each visit, thank you

## 2016-06-01 NOTE — Progress Notes (Signed)
Patient ID: Greg Kane, male   DOB: 1965-12-18, 50 y.o.   MRN: [id number]   Reason for Appointment :  Follow up of Type 2 Diabetes  History of Present Illness          Diagnosis: Type 2 diabetes mellitus, date of diagnosis 2008  Background information: Initial diagnosis was made incidentally and had only mild hyperglycemia. Initially was treated with metformin alone and then Actos was added He has not had any formal diabetes education and has had difficulty losing weight His blood sugars have been only fairly well controlled with A1c usually in the 7-8 range Because of inadequate control in 2013 he was given Victoza injection in addition. However Actos was stopped  He thinks this helped bring his blood sugars better controlled but helped his portion control only slightly He did not lose much weight with Victoza In 6/14 he was given Invokana in addition and his Victoza was resumed.  RECENT history    Treatment regimen: Victoza 1.8mg , Jardiance 25 mg, metformin 2000 mg  He usually has difficulty with  getting consistent control of his diabetes despite multiple drugs Previously because of high A1c of 8% and higher morning readings he was told to start back on Amaryl 1 mg However not clear why he did not start this  A1c is better at 7.2  Recent blood sugar patterns and problems identified:  He thinks he is trying to do better with his diet and eating small portions and more protein  He has lost some weight  He does have a bowl of cereal at around 8 PM  Despite reminders is not checking readings after meals and none after supper  FASTING blood sugars are variable and he thinks they were higher when he was either on vacation or having a respiratory illness for 2 weeks  He has been trying to walk when he can about 3 days a week  Last night he did not have a bedtime snack and his glucose was 145 this morning  Highest blood sugar was 334 after breakfast, pre-meal  blood sugar was 237  Glucose monitoring: Using One Touch meter less than once a day.     Blood Glucose readings from download:  Mean values apply above for all meters except median for One Touch  PRE-MEAL Fasting Lunch Dinner Bedtime Overall  Glucose range: 133-237   107-215     Mean/median:     163     Physical activity:  walking for up to 2 miles 3-4/7  Wt Readings from Last 3 Encounters:  06/01/16 278 lb (126.1 kg)  01/12/16 282 lb 6.4 oz (128.1 kg)  10/14/15 284 lb 9.6 oz (129.1 kg)      Lab Results  Component Value Date   HGBA1C 7.2 (H) 05/26/2016   HGBA1C 8.0 (H) 01/07/2016   HGBA1C 7.2 (H) 10/09/2015   Lab Results  Component Value Date   MICROALBUR <0.7 10/09/2015   LDLCALC 75 01/07/2016   CREATININE 1.17 05/26/2016     Lab Results  Component Value Date   MICROALBUR <0.7 10/09/2015   Lab on 05/26/2016  Component Date Value Ref Range Status  . Sodium 05/26/2016 138  135 - 145 mEq/L Final  . Potassium 05/26/2016 4.6  3.5 - 5.1 mEq/L Final  . Chloride 05/26/2016 103  96 - 112 mEq/L Final  . CO2 05/26/2016 27  19 - 32 mEq/L Final  . Glucose, Bld 05/26/2016 138* 70 - 99 mg/dL Final  . BUN  05/26/2016 20  6 - 23 mg/dL Final  . Creatinine, Ser 05/26/2016 1.17  0.40 - 1.50 mg/dL Final  . Calcium 05/26/2016 9.3  8.4 - 10.5 mg/dL Final  . GFR 05/26/2016 69.45  >60.00 mL/min Final  . Hgb A1c MFr Bld 05/26/2016 7.2* 4.6 - 6.5 % Final         Medication List       Accurate as of 06/01/16 12:56 PM. Always use your most recent med list.          allopurinol 100 MG tablet Commonly known as:  ZYLOPRIM Take 1 tablet (100 mg total) by mouth 2 (two) times daily as needed. For gout   fish oil-omega-3 fatty acids 1000 MG capsule Take 1 g by mouth daily.   gabapentin 300 MG capsule Commonly known as:  NEURONTIN Take 1 capsule 3 times a day as needed   glimepiride 1 MG tablet Commonly known as:  AMARYL Take 1 tablet (1 mg total) by mouth daily with supper.     glucose blood test strip Commonly known as:  ONE TOUCH ULTRA TEST Use as instructed to check blood sugar 2 times daily dx code E11.29   HYDROcodone-acetaminophen 5-325 MG tablet Commonly known as:  NORCO/VICODIN   Insulin Pen Needle 32G X 5 MM Misc Commonly known as:  NOVOTWIST USE AS DIRECTED   JARDIANCE 25 MG Tabs tablet Generic drug:  empagliflozin TAKE 1 TABLET BY MOUTH DAILY   metFORMIN 500 MG 24 hr tablet Commonly known as:  GLUCOPHAGE-XR Take 2 tablets (1,000 mg total) by mouth 2 (two) times daily.   ONETOUCH DELICA LANCETS 99991111 Misc Use to check blood sugar 2 times per day dx code E11.65   polyethylene glycol powder powder Commonly known as:  GLYCOLAX/MIRALAX Take 1 Container by mouth daily.   rosuvastatin 10 MG tablet Commonly known as:  CRESTOR Take 1 tablet (10 mg total) by mouth daily.   VICTOZA 18 MG/3ML Sopn Generic drug:  Liraglutide INJECT 1.8 MG DAILY       Allergies:  Allergies  Allergen Reactions  . Penicillins Anaphylaxis  . Influenza Virus Vaccine Split     cellulitis  . Pneumococcal Vaccines Other (See Comments)    Cellulitis, swelling    Past Medical History:  Diagnosis Date  . Allergy   . Bowel obstruction ([place]) 10/2011  . Diabetes mellitus   . Gout   . Hx of adenomatous polyp of colon 01/22/2015  . Hyperlipidemia   . Hypertension   . Umbilical hernia     Past Surgical History:  Procedure Laterality Date  . APPENDECTOMY     1980s    Family History  Problem Relation Age of Onset  . Diabetes Mother   . Hyperlipidemia Father   . Hypertension Father   . Dementia Father   . Colon cancer Neg Hx   . Esophageal cancer Neg Hx   . Stomach cancer Neg Hx   . Rectal cancer Neg Hx     Social History:  reports that he has been smoking Cigarettes and Cigars.  He has been smoking about 0.25 packs per day. He has never used smokeless tobacco. He reports that he drinks alcohol. He reports that he does not use drugs.    Review of  Systems      Hyperlipidemia: He has mixed hyperlipidemia and was taking Lipitor 20 mg, had muscle pain and has stopped this He was concerned about the cost of Livalo and is now taking 10 mg Crestor which  he is tolerating   Lab Results  Component Value Date   CHOL 130 01/07/2016   HDL 26.60 (L) 01/07/2016   LDLCALC 75 01/07/2016   LDLDIRECT 118.0 10/09/2015   TRIG 140.0 01/07/2016   CHOLHDL 5 01/07/2016        He had painful burning sensation in his feet especially in the evenings, previously treated with gabapentin, Resolved.   Foot exam was normal in 4/16  HYPERTENSION: Previously had been taken off treatment when he started Invokana  LABS:   Lab on 05/26/2016  Component Date Value Ref Range Status  . Sodium 05/26/2016 138  135 - 145 mEq/L Final  . Potassium 05/26/2016 4.6  3.5 - 5.1 mEq/L Final  . Chloride 05/26/2016 103  96 - 112 mEq/L Final  . CO2 05/26/2016 27  19 - 32 mEq/L Final  . Glucose, Bld 05/26/2016 138* 70 - 99 mg/dL Final  . BUN 05/26/2016 20  6 - 23 mg/dL Final  . Creatinine, Ser 05/26/2016 1.17  0.40 - 1.50 mg/dL Final  . Calcium 05/26/2016 9.3  8.4 - 10.5 mg/dL Final  . GFR 05/26/2016 69.45  >60.00 mL/min Final  . Hgb A1c MFr Bld 05/26/2016 7.2* 4.6 - 6.5 % Final    Physical Examination:  BP 125/85 (Cuff Size: Normal)   Pulse 96   Ht 5\' 10"  (1.778 m)   Wt 278 lb (126.1 kg)   SpO2 96%   BMI 39.89 kg/m      ASSESSMENT/PLAN:   Diabetes type 2, uncontrolled   His blood sugars are relatively better with his trying to improve his diet  A1c is 7.2 However he has persistently high fasting readings Does not check his readings after supper  Recommendations made today:  He will try to change his bedtime snack to lower carbohydrate and high protein content instead of a bowl of cereal  Consider basal insulin if morning sugars not controlled, discussed  Continue to be consistent with diet and exercise   Hyperlipidemia: Will need follow-up on the  next visit  ?  Hypertension: Blood pressure is high normal today, consider beta blocker on the next visit since he tends to have persistent sinus tachycardia   Patient Instructions  Change bedtime snack  Check blood sugars on waking up  3x per week  Also check blood sugars about 2 hours after a meal and do this after different meals by rotation  Recommended blood sugar levels on waking up is 90-130 and about 2 hours after meal is 130-160  Please bring your blood sugar monitor to each visit, thank you     [facility] 06/01/2016, 12:56 PM

## 2016-06-10 ENCOUNTER — Encounter: Payer: Self-pay | Admitting: *Deleted

## 2016-06-14 ENCOUNTER — Other Ambulatory Visit: Payer: Self-pay | Admitting: Endocrinology

## 2016-07-07 ENCOUNTER — Other Ambulatory Visit: Payer: Self-pay | Admitting: Endocrinology

## 2016-08-01 ENCOUNTER — Other Ambulatory Visit: Payer: Self-pay | Admitting: Endocrinology

## 2016-08-24 ENCOUNTER — Other Ambulatory Visit: Payer: BC Managed Care – PPO

## 2016-08-25 ENCOUNTER — Other Ambulatory Visit (INDEPENDENT_AMBULATORY_CARE_PROVIDER_SITE_OTHER): Payer: BC Managed Care – PPO

## 2016-08-25 DIAGNOSIS — E1101 Type 2 diabetes mellitus with hyperosmolarity with coma: Secondary | ICD-10-CM | POA: Diagnosis not present

## 2016-08-25 LAB — COMPREHENSIVE METABOLIC PANEL
ALBUMIN: 4.4 g/dL (ref 3.5–5.2)
ALT: 32 U/L (ref 0–53)
AST: 24 U/L (ref 0–37)
Alkaline Phosphatase: 62 U/L (ref 39–117)
BUN: 18 mg/dL (ref 6–23)
CHLORIDE: 103 meq/L (ref 96–112)
CO2: 25 mEq/L (ref 19–32)
CREATININE: 1.11 mg/dL (ref 0.40–1.50)
Calcium: 9.9 mg/dL (ref 8.4–10.5)
GFR: 73.73 mL/min (ref >60.00)
GLUCOSE: 180 mg/dL — AB (ref 70–99)
POTASSIUM: 4.4 meq/L (ref 3.5–5.1)
SODIUM: 137 meq/L (ref 135–145)
Total Bilirubin: 0.6 mg/dL (ref 0.2–1.2)
Total Protein: 7.1 g/dL (ref 6.0–8.3)

## 2016-08-25 LAB — LIPID PANEL
CHOLESTEROL: 152 mg/dL (ref 0–200)
HDL: 30.7 mg/dL — AB (ref >39.00)
LDL CALC: 90 mg/dL (ref 0–99)
NonHDL: 121.63
TRIGLYCERIDES: 157 mg/dL — AB (ref 0.0–149.0)
Total CHOL/HDL Ratio: 5
VLDL: 31.4 mg/dL (ref 0.0–40.0)

## 2016-08-25 LAB — MICROALBUMIN / CREATININE URINE RATIO
Creatinine,U: 69.5 mg/dL
Microalb Creat Ratio: 1 mg/g (ref 0.0–30.0)

## 2016-08-25 LAB — HEMOGLOBIN A1C: HEMOGLOBIN A1C: 7.8 % — AB (ref 4.6–6.5)

## 2016-09-01 ENCOUNTER — Other Ambulatory Visit: Payer: Self-pay

## 2016-09-01 ENCOUNTER — Encounter: Payer: Self-pay | Admitting: Endocrinology

## 2016-09-01 ENCOUNTER — Ambulatory Visit (INDEPENDENT_AMBULATORY_CARE_PROVIDER_SITE_OTHER): Payer: BC Managed Care – PPO | Admitting: Endocrinology

## 2016-09-01 ENCOUNTER — Other Ambulatory Visit: Payer: Self-pay | Admitting: Endocrinology

## 2016-09-01 VITALS — BP 126/86 | HR 90 | Ht 70.0 in | Wt 281.0 lb

## 2016-09-01 DIAGNOSIS — E782 Mixed hyperlipidemia: Secondary | ICD-10-CM | POA: Diagnosis not present

## 2016-09-01 DIAGNOSIS — E1165 Type 2 diabetes mellitus with hyperglycemia: Secondary | ICD-10-CM | POA: Diagnosis not present

## 2016-09-01 MED ORDER — INSULIN DETEMIR 100 UNIT/ML FLEXPEN: 10.0000 [IU] | PEN_INJECTOR | Freq: Every day | SUBCUTANEOUS | 3 refills | Status: DC

## 2016-09-01 NOTE — Patient Instructions (Signed)
Take 6 units Levemir at bedtime  Keep am sugars < 140 and over 90

## 2016-09-01 NOTE — Progress Notes (Signed)
Patient ID: Greg Kane, male   DOB: 1966/07/21, 50 y.o.   MRN: [id number]   Reason for Appointment :  Follow up of Type 2 Diabetes  History of Present Illness          Diagnosis: Type 2 diabetes mellitus, date of diagnosis 2008  Background information: Initial diagnosis was made incidentally and had only mild hyperglycemia. Initially was treated with metformin alone and then Actos was added He has not had any formal diabetes education and has had difficulty losing weight His blood sugars have been only fairly well controlled with A1c usually in the 7-8 range Because of inadequate control in 2013 he was given Victoza injection in addition. However Actos was stopped  He thinks this helped bring his blood sugars better controlled but helped his portion control only slightly He did not lose much weight with Victoza In 6/14 he was given Invokana in addition and his Victoza was resumed.  RECENT history    Treatment regimen: Victoza 1.8mg , Jardiance 25 mg, metformin 2000 mg  He usually has difficulty with  getting consistent control of his diabetes despite multiple drugs A1c has not been below 7, was 7.2 previously and now 7.8  Recent blood sugar patterns and problems identified:  He thinks his sugars are higher now because of various family issues and eating out more as well as not making good choices with his meals  Also has not been consistent with exercise  Has gained back a little weight  Despite reminders is not checking readings after meals and none after supper  FASTING blood sugars are variable and overall still high, occasionally have been over 200 also; has only a couple of readings below 140  He has been trying to walk but not in the last month  He has been on Jardiance instead of Invokana because of insurance preference since 7/17 but his A1c was not higher in August  Even with Amaryl not clear if his fasting blood sugars were better last year  Blood  sugar was not higher after his breakfast when he came to the lab  Glucose monitoring: Using One Touch meter less than once a day.     Blood Glucose readings from download:  Mean values apply above for all meters except median for One Touch  PRE-MEAL Fasting Lunch Dinner Bedtime Overall  Glucose range: 108-214   114-198     Mean/median: 175    176     Physical activity: was walking for up to 2 miles 3-4/7  Wt Readings from Last 3 Encounters:  09/01/16 281 lb (127.5 kg)  06/01/16 278 lb (126.1 kg)  01/12/16 282 lb 6.4 oz (128.1 kg)      Lab Results  Component Value Date   HGBA1C 7.8 (H) 08/25/2016   HGBA1C 7.2 (H) 05/26/2016   HGBA1C 8.0 (H) 01/07/2016   Lab Results  Component Value Date   MICROALBUR <0.7 08/25/2016   LDLCALC 90 08/25/2016   CREATININE 1.11 08/25/2016     Lab Results  Component Value Date   MICROALBUR <0.7 08/25/2016   No visits with results within 1 Week(s) from this visit.  Latest known visit with results is:  Lab on 08/25/2016  Component Date Value Ref Range Status  . Hgb A1c MFr Bld 08/25/2016 7.8* 4.6 - 6.5 % Final  . Sodium 08/25/2016 137  135 - 145 mEq/L Final  . Potassium 08/25/2016 4.4  3.5 - 5.1 mEq/L Final  . Chloride 08/25/2016 103  96 -  112 mEq/L Final  . CO2 08/25/2016 25  19 - 32 mEq/L Final  . Glucose, Bld 08/25/2016 180* 70 - 99 mg/dL Final  . BUN 08/25/2016 18  6 - 23 mg/dL Final  . Creatinine, Ser 08/25/2016 1.11  0.40 - 1.50 mg/dL Final  . Total Bilirubin 08/25/2016 0.6  0.2 - 1.2 mg/dL Final  . Alkaline Phosphatase 08/25/2016 62  39 - 117 U/L Final  . AST 08/25/2016 24  0 - 37 U/L Final  . ALT 08/25/2016 32  0 - 53 U/L Final  . Total Protein 08/25/2016 7.1  6.0 - 8.3 g/dL Final  . Albumin 08/25/2016 4.4  3.5 - 5.2 g/dL Final  . Calcium 08/25/2016 9.9  8.4 - 10.5 mg/dL Final  . GFR 08/25/2016 73.73  >60.00 mL/min Final  . Microalb, Ur 08/25/2016 <0.7  0.0 - 1.9 mg/dL Final  . Creatinine,U 08/25/2016 69.5  mg/dL Final  .  Microalb Creat Ratio 08/25/2016 1.0  0.0 - 30.0 mg/g Final  . Cholesterol 08/25/2016 152  0 - 200 mg/dL Final  . Triglycerides 08/25/2016 157.0* 0.0 - 149.0 mg/dL Final  . HDL 08/25/2016 30.70* >39.00 mg/dL Final  . VLDL 08/25/2016 31.4  0.0 - 40.0 mg/dL Final  . LDL Cholesterol 08/25/2016 90  0 - 99 mg/dL Final  . Total CHOL/HDL Ratio 08/25/2016 5   Final  . NonHDL 08/25/2016 121.63   Final         Medication List       Accurate as of 09/01/16 11:38 AM. Always use your most recent med list.          allopurinol 100 MG tablet Commonly known as:  ZYLOPRIM Take 1 tablet (100 mg total) by mouth 2 (two) times daily as needed. For gout   fish oil-omega-3 fatty acids 1000 MG capsule Take 1 g by mouth daily.   gabapentin 300 MG capsule Commonly known as:  NEURONTIN Take 1 capsule 3 times a day as needed   glimepiride 1 MG tablet Commonly known as:  AMARYL Take 1 tablet (1 mg total) by mouth daily with supper.   glucose blood test strip Commonly known as:  ONE TOUCH ULTRA TEST Use as instructed to check blood sugar 2 times daily dx code E11.29   HYDROcodone-acetaminophen 5-325 MG tablet Commonly known as:  NORCO/VICODIN   Insulin Detemir 100 UNIT/ML Pen Commonly known as:  LEVEMIR FLEXTOUCH Inject 10 Units into the skin daily at 10 pm.   Insulin Pen Needle 32G X 5 MM Misc Commonly known as:  NOVOTWIST USE AS DIRECTED   JARDIANCE 25 MG Tabs tablet Generic drug:  empagliflozin TAKE 1 TABLET BY MOUTH DAILY   metFORMIN 500 MG 24 hr tablet Commonly known as:  GLUCOPHAGE-XR TAKE 2 TABLETS (1000MG ) TWOTIMES A DAY   ONETOUCH DELICA LANCETS 99991111 Misc Use to check blood sugar 2 times per day dx code E11.65   polyethylene glycol powder powder Commonly known as:  GLYCOLAX/MIRALAX Take 1 Container by mouth daily.   rosuvastatin 10 MG tablet Commonly known as:  CRESTOR TAKE 1 TABLET DAILY   VICTOZA 18 MG/3ML Sopn Generic drug:  liraglutide INJECT 1.8 MG DAILY        Allergies:  Allergies  Allergen Reactions  . Penicillins Anaphylaxis  . Influenza Virus Vaccine Split     cellulitis  . Pneumococcal Vaccines Other (See Comments)    Cellulitis, swelling    Past Medical History:  Diagnosis Date  . Allergy   . Bowel obstruction 10/2011  .  Diabetes mellitus   . Gout   . Hx of adenomatous polyp of colon 01/22/2015  . Hyperlipidemia   . Hypertension   . Umbilical hernia     Past Surgical History:  Procedure Laterality Date  . APPENDECTOMY     1980s    Family History  Problem Relation Age of Onset  . Diabetes Mother   . Hyperlipidemia Father   . Hypertension Father   . Dementia Father   . Colon cancer Neg Hx   . Esophageal cancer Neg Hx   . Stomach cancer Neg Hx   . Rectal cancer Neg Hx     Social History:  reports that he has been smoking Cigarettes and Cigars.  He has been smoking about 0.25 packs per day. He has never used smokeless tobacco. He reports that he drinks alcohol. He reports that he does not use drugs.    Review of Systems      Hyperlipidemia: He has mixed hyperlipidemia and was taking Lipitor 20 mg, had muscle pain and has stopped this He is now taking 10 mg Crestor which he is tolerating without muscle pain and LDL is controlled   Lab Results  Component Value Date   CHOL 152 08/25/2016   HDL 30.70 (L) 08/25/2016   LDLCALC 90 08/25/2016   LDLDIRECT 118.0 10/09/2015   TRIG 157.0 (H) 08/25/2016   CHOLHDL 5 08/25/2016        He had painful burning sensation in his feet especially in the evenings, previously treated with gabapentin, Resolved.   Foot exam was normal in 4/16  HYPERTENSION: He had been taken off treatment when he started Invokana  LABS:   No visits with results within 1 Week(s) from this visit.  Latest known visit with results is:  Lab on 08/25/2016  Component Date Value Ref Range Status  . Hgb A1c MFr Bld 08/25/2016 7.8* 4.6 - 6.5 % Final  . Sodium 08/25/2016 137  135 - 145 mEq/L Final   . Potassium 08/25/2016 4.4  3.5 - 5.1 mEq/L Final  . Chloride 08/25/2016 103  96 - 112 mEq/L Final  . CO2 08/25/2016 25  19 - 32 mEq/L Final  . Glucose, Bld 08/25/2016 180* 70 - 99 mg/dL Final  . BUN 08/25/2016 18  6 - 23 mg/dL Final  . Creatinine, Ser 08/25/2016 1.11  0.40 - 1.50 mg/dL Final  . Total Bilirubin 08/25/2016 0.6  0.2 - 1.2 mg/dL Final  . Alkaline Phosphatase 08/25/2016 62  39 - 117 U/L Final  . AST 08/25/2016 24  0 - 37 U/L Final  . ALT 08/25/2016 32  0 - 53 U/L Final  . Total Protein 08/25/2016 7.1  6.0 - 8.3 g/dL Final  . Albumin 08/25/2016 4.4  3.5 - 5.2 g/dL Final  . Calcium 08/25/2016 9.9  8.4 - 10.5 mg/dL Final  . GFR 08/25/2016 73.73  >60.00 mL/min Final  . Microalb, Ur 08/25/2016 <0.7  0.0 - 1.9 mg/dL Final  . Creatinine,U 08/25/2016 69.5  mg/dL Final  . Microalb Creat Ratio 08/25/2016 1.0  0.0 - 30.0 mg/g Final  . Cholesterol 08/25/2016 152  0 - 200 mg/dL Final  . Triglycerides 08/25/2016 157.0* 0.0 - 149.0 mg/dL Final  . HDL 08/25/2016 30.70* >39.00 mg/dL Final  . VLDL 08/25/2016 31.4  0.0 - 40.0 mg/dL Final  . LDL Cholesterol 08/25/2016 90  0 - 99 mg/dL Final  . Total CHOL/HDL Ratio 08/25/2016 5   Final  . NonHDL 08/25/2016 121.63   Final  Physical Examination:  BP 126/86   Pulse 90   Ht 5\' 10"  (1.778 m)   Wt 281 lb (127.5 kg)   BMI 40.32 kg/m    Repeat blood pressure 118/82 regular cuff  ASSESSMENT/PLAN:   Diabetes type 2, uncontrolled  See history of present illness for detailed discussion of current diabetes management, blood sugar patterns and problems identified  A1c is 7.8 and has not been below 7% Has persistently high fasting readings which are only occasionally around 140 but averaging about 170 Previously had not seem consistent improvement with taking Amaryl at night Also not clear if he has post prandial hyperglycemia after his evening meal Although he is generally trying to watch his diet he has not been consistent in the last  month  Not clear whether his blood sugars maybe higher with switching Invokana to [place] because of insurance issues  Recommendations made today:  He will start Levemir insulin 6 units at bedtime  Discussed in detail how basal insulin works, use of the Flex pen, timing of injection, action profile of the insulin and given him a flowsheet to titrate the dose every 3 days by 1 unit to get blood sugars under 140 in the morning  Restart exercise  Consistent diet  More readings at bedtime  He can also take his Victoza at bedtime with his insulin  He will call if he has any difficulties with this but otherwise will follow-up in 2 months   Hyperlipidemia: Will need follow-up on the next visit    Hypertension: Blood pressure is mildly increased again and will continue to monitor Needs annual microalbumin, now normal   Patient Instructions  Take 6 units Levemir at bedtime  Keep am sugars < 140 and over 90  Counseling time on subjects discussed above is over 50% of today's 25 minute visit   Anslie Spadafora 09/01/2016, 11:38 AM

## 2016-09-15 ENCOUNTER — Telehealth: Payer: Self-pay

## 2016-09-15 NOTE — Telephone Encounter (Signed)
LVM advising patient of message below °

## 2016-09-15 NOTE — Telephone Encounter (Signed)
Received FL2 forms for patient.  Pt has not been seen since 11/01/14 and needs an office visit in order to complete form.  Please call patient and schedule an appt.

## 2016-09-16 NOTE — Telephone Encounter (Signed)
Disregard note about appt.  Entered in patient's chart in error.

## 2016-09-28 ENCOUNTER — Other Ambulatory Visit: Payer: Self-pay | Admitting: Endocrinology

## 2016-10-04 HISTORY — PX: LUMBAR SPINE SURGERY: SHX701

## 2016-10-14 ENCOUNTER — Other Ambulatory Visit: Payer: Self-pay | Admitting: Endocrinology

## 2016-11-02 ENCOUNTER — Other Ambulatory Visit: Payer: Self-pay | Admitting: Endocrinology

## 2016-11-03 ENCOUNTER — Other Ambulatory Visit: Payer: Self-pay

## 2016-11-03 MED ORDER — ONETOUCH DELICA LANCETS 33G MISC: 3 refills | Status: DC

## 2016-11-09 ENCOUNTER — Other Ambulatory Visit (INDEPENDENT_AMBULATORY_CARE_PROVIDER_SITE_OTHER): Payer: BC Managed Care – PPO

## 2016-11-09 DIAGNOSIS — E1165 Type 2 diabetes mellitus with hyperglycemia: Secondary | ICD-10-CM | POA: Diagnosis not present

## 2016-11-09 LAB — BASIC METABOLIC PANEL
BUN: 20 mg/dL (ref 6–23)
CALCIUM: 9.6 mg/dL (ref 8.4–10.5)
CO2: 25 meq/L (ref 19–32)
Chloride: 105 mEq/L (ref 96–112)
Creatinine, Ser: 1.06 mg/dL (ref 0.40–1.50)
GFR: 77.69 mL/min (ref >60.00)
GLUCOSE: 144 mg/dL — AB (ref 70–99)
POTASSIUM: 4.1 meq/L (ref 3.5–5.1)
SODIUM: 136 meq/L (ref 135–145)

## 2016-11-10 LAB — FRUCTOSAMINE: Fructosamine: 241 umol/L (ref 0–285)

## 2016-11-12 ENCOUNTER — Other Ambulatory Visit: Payer: Self-pay

## 2016-11-12 ENCOUNTER — Encounter: Payer: Self-pay | Admitting: Endocrinology

## 2016-11-12 ENCOUNTER — Ambulatory Visit (INDEPENDENT_AMBULATORY_CARE_PROVIDER_SITE_OTHER): Payer: BC Managed Care – PPO | Admitting: Endocrinology

## 2016-11-12 VITALS — BP 128/86 | HR 84 | Ht 70.0 in | Wt 284.0 lb

## 2016-11-12 DIAGNOSIS — E1165 Type 2 diabetes mellitus with hyperglycemia: Secondary | ICD-10-CM

## 2016-11-12 DIAGNOSIS — Z794 Long term (current) use of insulin: Secondary | ICD-10-CM | POA: Diagnosis not present

## 2016-11-12 MED ORDER — INSULIN PEN NEEDLE 32G X 5 MM MISC: 1 refills | Status: DC

## 2016-11-12 NOTE — Progress Notes (Signed)
Patient ID: Greg Kane, male   DOB: Nov 16, 1965, 51 y.o.   MRN: [id number]   Reason for Appointment :  Follow up of Type 2 Diabetes  History of Present Illness          Diagnosis: Type 2 diabetes mellitus, date of diagnosis 2008  Background information: Initial diagnosis was made incidentally and had only mild hyperglycemia. Initially was treated with metformin alone and then Actos was added He has not had any formal diabetes education and has had difficulty losing weight His blood sugars have been only fairly well controlled with A1c usually in the 7-8 range Because of inadequate control in 2013 he was given Victoza injection in addition. However Actos was stopped  He thinks this helped bring his blood sugars better controlled but helped his portion control only slightly He did not lose much weight with Victoza In 6/14 he was given Invokana in addition and his Victoza was resumed.  RECENT history    Treatment regimen: Victoza 1.8mg , Jardiance 25 mg, metformin 2000 mg  He usually has difficulty with  getting consistent control of his diabetes despite multiple drugs A1c was 7.8 on the last visit when he was started on LEVEMIR insulin  Recent blood sugar patterns and problems identified:  He has started Levemir insulin, although he was told to continue increasing the dose to get his morning sugars below 130 did not go above 10 units  His fasting readings are still relatively high  Overall his blood sugars are better and his fructosamine is excellent at 241  He says he feels better with less fluctuation in his blood sugars  However he has also started doing a good exercise program at least 3-4 days a week at the gym  His weight however has gone up slightly  He again forgets to do readings AFTER meals as directed  Mostly blood sugars are lower around suppertime and has only one high reading; lowest blood sugar 74 after exercise  No hypoglycemia  He is compliant  with his other diabetes medications also  Glucose monitoring: Using One Touch meter  once a day.     Blood Glucose readings from download:  Mean values apply above for all meters except median for One Touch  PRE-MEAL Fasting Lunch Dinner Bedtime Overall  Glucose range: 128-184   74-179     Mean/median: 156   122   152     Physical activity: walking/Other exercises for up to 45 minutes, 3-4/7 for 3 weeks  Wt Readings from Last 3 Encounters:  11/12/16 284 lb (128.8 kg)  09/01/16 281 lb (127.5 kg)  06/01/16 278 lb (126.1 kg)      Lab Results  Component Value Date   HGBA1C 7.8 (H) 08/25/2016   HGBA1C 7.2 (H) 05/26/2016   HGBA1C 8.0 (H) 01/07/2016   Lab Results  Component Value Date   MICROALBUR <0.7 08/25/2016   LDLCALC 90 08/25/2016   CREATININE 1.06 11/09/2016     Lab Results  Component Value Date   MICROALBUR <0.7 08/25/2016   Lab on 11/09/2016  Component Date Value Ref Range Status  . Sodium 11/09/2016 136  135 - 145 mEq/L Final  . Potassium 11/09/2016 4.1  3.5 - 5.1 mEq/L Final  . Chloride 11/09/2016 105  96 - 112 mEq/L Final  . CO2 11/09/2016 25  19 - 32 mEq/L Final  . Glucose, Bld 11/09/2016 144* 70 - 99 mg/dL Final  . BUN 11/09/2016 20  6 - 23 mg/dL Final  .  Creatinine, Ser 11/09/2016 1.06  0.40 - 1.50 mg/dL Final  . Calcium 11/09/2016 9.6  8.4 - 10.5 mg/dL Final  . GFR 11/09/2016 77.69  >60.00 mL/min Final  . Fructosamine 11/09/2016 241  0 - 285 umol/L Final   Comment: Published reference interval for apparently healthy subjects between age 28 and 63 is 63 - 285 umol/L and in a poorly controlled diabetic population is 228 - 563 umol/L with a mean of 396 umol/L.        Allergies as of 11/12/2016      Reactions   Penicillins Anaphylaxis   Influenza Virus Vaccine Split    cellulitis   Pneumococcal Vaccines Other (See Comments)   Cellulitis, swelling      Medication List       Accurate as of 11/12/16 10:08 AM. Always use your most recent med list.            allopurinol 100 MG tablet Commonly known as:  ZYLOPRIM Take 1 tablet (100 mg total) by mouth 2 (two) times daily as needed. For gout   fish oil-omega-3 fatty acids 1000 MG capsule Take 1 g by mouth daily.   gabapentin 300 MG capsule Commonly known as:  NEURONTIN Take 1 capsule 3 times a day as needed   glimepiride 1 MG tablet Commonly known as:  AMARYL Take 1 tablet (1 mg total) by mouth daily with supper.   HYDROcodone-acetaminophen 5-325 MG tablet Commonly known as:  NORCO/VICODIN   Insulin Detemir 100 UNIT/ML Pen Commonly known as:  LEVEMIR FLEXTOUCH Inject 10 Units into the skin daily at 10 pm.   Insulin Pen Needle 32G X 5 MM Misc Commonly known as:  NOVOTWIST USE AS DIRECTED   JARDIANCE 25 MG Tabs tablet Generic drug:  empagliflozin TAKE 1 TABLET BY MOUTH DAILY   metFORMIN 500 MG 24 hr tablet Commonly known as:  GLUCOPHAGE-XR TAKE 2 TABLETS (1000MG ) TWOTIMES A DAY   ONE TOUCH ULTRA TEST test strip Generic drug:  glucose blood USE AS DIRECTED TO CHECK BLOOD SUGAR TWICE DAILY   ONETOUCH DELICA LANCETS 99991111 Misc Use to check blood sugar 2 times per day dx code E11.65   polyethylene glycol powder powder Commonly known as:  GLYCOLAX/MIRALAX Take 1 Container by mouth daily.   rosuvastatin 10 MG tablet Commonly known as:  CRESTOR TAKE 1 TABLET DAILY   VICTOZA 18 MG/3ML Sopn Generic drug:  liraglutide INJECT 1.8 MG DAILY       Allergies:  Allergies  Allergen Reactions  . Penicillins Anaphylaxis  . Influenza Virus Vaccine Split     cellulitis  . Pneumococcal Vaccines Other (See Comments)    Cellulitis, swelling    Past Medical History:  Diagnosis Date  . Allergy   . Bowel obstruction 10/2011  . Diabetes mellitus   . Gout   . Hx of adenomatous polyp of colon 01/22/2015  . Hyperlipidemia   . Hypertension   . Umbilical hernia     Past Surgical History:  Procedure Laterality Date  . APPENDECTOMY     1980s    Family History   Problem Relation Age of Onset  . Diabetes Mother   . Hyperlipidemia Father   . Hypertension Father   . Dementia Father   . Colon cancer Neg Hx   . Esophageal cancer Neg Hx   . Stomach cancer Neg Hx   . Rectal cancer Neg Hx     Social History:  reports that he has been smoking Cigarettes and Cigars.  He has  been smoking about 0.25 packs per day. He has never used smokeless tobacco. He reports that he drinks alcohol. He reports that he does not use drugs.    Review of Systems      Hyperlipidemia: He has mixed hyperlipidemia and was taking Lipitor 20 mg, had muscle pain and has stopped this He is now taking 10 mg Crestor which he is tolerating without muscle pain and LDL is controlled   Lab Results  Component Value Date   CHOL 152 08/25/2016   HDL 30.70 (L) 08/25/2016   LDLCALC 90 08/25/2016   LDLDIRECT 118.0 10/09/2015   TRIG 157.0 (H) 08/25/2016   CHOLHDL 5 08/25/2016        He had painful burning sensation in his feet especially in the evenings, previously treated with gabapentin, Resolved.   Foot exam was normal in 4/16  HYPERTENSION: He had been taken off treatment when he started Invokana  BP Readings from Last 3 Encounters:  11/12/16 128/86  09/01/16 126/86  06/01/16 125/85     LABS:   Lab on 11/09/2016  Component Date Value Ref Range Status  . Sodium 11/09/2016 136  135 - 145 mEq/L Final  . Potassium 11/09/2016 4.1  3.5 - 5.1 mEq/L Final  . Chloride 11/09/2016 105  96 - 112 mEq/L Final  . CO2 11/09/2016 25  19 - 32 mEq/L Final  . Glucose, Bld 11/09/2016 144* 70 - 99 mg/dL Final  . BUN 11/09/2016 20  6 - 23 mg/dL Final  . Creatinine, Ser 11/09/2016 1.06  0.40 - 1.50 mg/dL Final  . Calcium 11/09/2016 9.6  8.4 - 10.5 mg/dL Final  . GFR 11/09/2016 77.69  >60.00 mL/min Final  . Fructosamine 11/09/2016 241  0 - 285 umol/L Final   Comment: Published reference interval for apparently healthy subjects between age 19 and 30 is 27 - 285 umol/L and in a  poorly controlled diabetic population is 228 - 563 umol/L with a mean of 396 umol/L.     Physical Examination:  BP 128/86   Pulse 84   Ht 5\' 10"  (1.778 m)   Wt 284 lb (128.8 kg)   SpO2 95%   BMI 40.75 kg/m    Repeat blood pressure: 124/84 large cuff  ASSESSMENT/PLAN:  Diabetes type 2, uncontrolled  See history of present illness for detailed discussion of current diabetes management, blood sugar patterns and problems identified  His blood sugars are somewhat better controlled with low-dose insulin being added However fasting readings are still averaging over 150 with some variability Again he does not check readings after supper as directed Fructosamine is 241, indicates overall better control with last A1c 7.8 He is also doing better with exercise regimen  Recommendations made today:  He will start increasing Levemir insulin by 2 units again and start with 12 units tonight  He will try to get his morning readings consistently around 120-130  Also reminded him to check readings after supper   Hyperlipidemia: LDL controlled previously    Hypertension: Blood pressure is mildly increased again and recommended going back to lisinopril but he wants to wait until another visit     There are no Patient Instructions on file for this visit.   Greg Kane 11/12/2016, 10:08 AM

## 2016-11-12 NOTE — Patient Instructions (Signed)
Levemir 12 units  More sugars at bedtime

## 2016-12-02 ENCOUNTER — Other Ambulatory Visit: Payer: Self-pay

## 2016-12-02 ENCOUNTER — Telehealth: Payer: Self-pay | Admitting: Endocrinology

## 2016-12-02 MED ORDER — INSULIN PEN NEEDLE 32G X 4 MM MISC: 1 refills | Status: DC

## 2016-12-02 NOTE — Telephone Encounter (Signed)
Ordered

## 2016-12-02 NOTE — Telephone Encounter (Signed)
Insurance will not cover  Insulin Pen Needle (NOVOTWIST) 32G X 5 MM MISC 100 each   Change to BD needles   Running low on needles patient  stated

## 2016-12-07 ENCOUNTER — Other Ambulatory Visit: Payer: Self-pay | Admitting: Endocrinology

## 2016-12-09 ENCOUNTER — Other Ambulatory Visit: Payer: Self-pay

## 2017-01-03 ENCOUNTER — Other Ambulatory Visit: Payer: Self-pay | Admitting: Endocrinology

## 2017-01-06 ENCOUNTER — Other Ambulatory Visit: Payer: Self-pay | Admitting: Endocrinology

## 2017-02-04 ENCOUNTER — Other Ambulatory Visit (INDEPENDENT_AMBULATORY_CARE_PROVIDER_SITE_OTHER): Payer: BC Managed Care – PPO

## 2017-02-04 DIAGNOSIS — E1165 Type 2 diabetes mellitus with hyperglycemia: Secondary | ICD-10-CM

## 2017-02-04 DIAGNOSIS — Z794 Long term (current) use of insulin: Secondary | ICD-10-CM | POA: Diagnosis not present

## 2017-02-04 LAB — BASIC METABOLIC PANEL WITH GFR
BUN: 18 mg/dL (ref 6–23)
CO2: 26 meq/L (ref 19–32)
Calcium: 9.7 mg/dL (ref 8.4–10.5)
Chloride: 105 meq/L (ref 96–112)
Creatinine, Ser: 1.1 mg/dL (ref 0.40–1.50)
GFR: 74.37 mL/min
Glucose, Bld: 109 mg/dL — ABNORMAL HIGH (ref 70–99)
Potassium: 4.3 meq/L (ref 3.5–5.1)
Sodium: 136 meq/L (ref 135–145)

## 2017-02-04 LAB — HEMOGLOBIN A1C: Hgb A1c MFr Bld: 7.7 % — ABNORMAL HIGH (ref 4.6–6.5)

## 2017-02-07 ENCOUNTER — Other Ambulatory Visit: Payer: Self-pay | Admitting: Endocrinology

## 2017-02-09 ENCOUNTER — Encounter: Payer: Self-pay | Admitting: Endocrinology

## 2017-02-09 ENCOUNTER — Ambulatory Visit (INDEPENDENT_AMBULATORY_CARE_PROVIDER_SITE_OTHER): Payer: BC Managed Care – PPO | Admitting: Endocrinology

## 2017-02-09 VITALS — BP 118/78 | HR 93 | Ht 70.0 in | Wt 279.0 lb

## 2017-02-09 DIAGNOSIS — E1165 Type 2 diabetes mellitus with hyperglycemia: Secondary | ICD-10-CM | POA: Diagnosis not present

## 2017-02-09 MED ORDER — FREESTYLE LIBRE SENSOR SYSTEM MISC: 3 refills | Status: DC

## 2017-02-09 MED ORDER — FREESTYLE LIBRE READER DEVI: 1.0000 | 0 refills | Status: DC

## 2017-02-09 NOTE — Progress Notes (Signed)
Patient ID: Greg Kane, male   DOB: 1965-11-02, 51 y.o.   MRN: [id number]   Reason for Appointment :  Follow up of Type 2 Diabetes  History of Present Illness          Diagnosis: Type 2 diabetes mellitus, date of diagnosis 2008  Background information: Initial diagnosis was made incidentally and had only mild hyperglycemia. Initially was treated with metformin alone and then Actos was added He has not had any formal diabetes education and has had difficulty losing weight His blood sugars have been only fairly well controlled with A1c usually in the 7-8 range Because of inadequate control in 2013 he was given Victoza injection in addition. However Actos was stopped  He thinks this helped bring his blood sugars better controlled but helped his portion control only slightly He did not lose much weight with Victoza In 6/14 he was given Invokana in addition and his Victoza was resumed.  RECENT history    Treatment regimen: Victoza 1.8mg , Jardiance 25 mg, metformin 2000 mg  Insulin: Levemir 15 units at night  He usually has difficulty with  getting consistent control of his diabetes despite multiple drugs  A1c is only down to 7.7, previously was 7.8 when he was started on bedtime insulin  Recent blood sugar patterns and problems identified:  He has increased his insulin only slightly since his last visit  His fasting readings are still relatively high and maybe higher on an average compared to the last visit but variable  Despite reminders he does not check his readings after supper  He thinks he is doing fairly well with his diet overall but even with exercising regularly has lost only 4 pounds  He has only a few readings in the afternoon with one high reading on the weekend  He is compliant with his other diabetes medications   Glucose monitoring: Using One Touch meter  once a day.     Blood Glucose readings from download:  Mean values apply above for all  meters except median for One Touch  PRE-MEAL Fasting Lunch Dinner Bedtime Overall  Glucose range: 121-204       Mean/median: 164    158     Physical activity: walking/Other exercises at the gym for up to 45 minutes, 3-4/7   Wt Readings from Last 3 Encounters:  02/09/17 279 lb (126.6 kg)  11/12/16 284 lb (128.8 kg)  09/01/16 281 lb (127.5 kg)      Lab Results  Component Value Date   HGBA1C 7.7 (H) 02/04/2017   HGBA1C 7.8 (H) 08/25/2016   HGBA1C 7.2 (H) 05/26/2016   Lab Results  Component Value Date   MICROALBUR <0.7 08/25/2016   [place] 90 08/25/2016   CREATININE 1.10 02/04/2017     Lab Results  Component Value Date   MICROALBUR <0.7 08/25/2016   Lab on 02/04/2017  Component Date Value Ref Range Status  . Hgb A1c MFr Bld 02/04/2017 7.7* 4.6 - 6.5 % Final   Glycemic Control Guidelines for People with Diabetes:Non Diabetic:  <6%Goal of Therapy: <7%Additional Action Suggested:  >8%   . Sodium 02/04/2017 136  135 - 145 mEq/L Final  . Potassium 02/04/2017 4.3  3.5 - 5.1 mEq/L Final  . Chloride 02/04/2017 105  96 - 112 mEq/L Final  . CO2 02/04/2017 26  19 - 32 mEq/L Final  . Glucose, Bld 02/04/2017 109* 70 - 99 mg/dL Final  . BUN 02/04/2017 18  6 - 23 mg/dL Final  .  Creatinine, Ser 02/04/2017 1.10  0.40 - 1.50 mg/dL Final  . Calcium 02/04/2017 9.7  8.4 - 10.5 mg/dL Final  . GFR 02/04/2017 74.37  >60.00 mL/min Final       Allergies as of 02/09/2017      Reactions   Penicillins Anaphylaxis   Influenza Virus Vaccine Split    cellulitis   Pneumococcal Vaccines Other (See Comments)   Cellulitis, swelling      Medication List       Accurate as of 02/09/17 11:59 PM. Always use your most recent med list.          allopurinol 100 MG tablet Commonly known as:  ZYLOPRIM Take 1 tablet (100 mg total) by mouth 2 (two) times daily as needed. For gout   FREESTYLE LIBRE READER Devi 1 Device by Does not apply route as directed.   [place] Misc Apply  to upper arm and change sensor every 10 days   gabapentin 300 MG capsule Commonly known as:  NEURONTIN Take 1 capsule 3 times a day as needed   Insulin Detemir 100 UNIT/ML Pen Commonly known as:  LEVEMIR FLEXTOUCH Inject 10 Units into the skin daily at 10 pm.   Insulin Pen Needle 32G X 4 MM Misc Use to inject insulin once daily   JARDIANCE 25 MG Tabs tablet Generic drug:  empagliflozin TAKE 1 TABLET BY MOUTH DAILY   metFORMIN 500 MG 24 hr tablet Commonly known as:  GLUCOPHAGE-XR TAKE 2 TABLETS (1000MG ) TWOTIMES A DAY   ONE TOUCH ULTRA TEST test strip Generic drug:  glucose blood USE AS DIRECTED TO CHECK BLOOD SUGAR TWICE DAILY   ONETOUCH DELICA LANCETS 77O Misc Use to check blood sugar 2 times per day dx code E11.65   rosuvastatin 10 MG tablet Commonly known as:  CRESTOR TAKE 1 TABLET DAILY   VICTOZA 18 MG/3ML Sopn Generic drug:  liraglutide INJECT 1.8 MG DAILY       Allergies:  Allergies  Allergen Reactions  . Penicillins Anaphylaxis  . Influenza Virus Vaccine Split     cellulitis  . Pneumococcal Vaccines Other (See Comments)    Cellulitis, swelling    Past Medical History:  Diagnosis Date  . Allergy   . Bowel obstruction ([place]) 10/2011  . Diabetes mellitus   . Gout   . Hx of adenomatous polyp of colon 01/22/2015  . Hyperlipidemia   . Hypertension   . Umbilical hernia     Past Surgical History:  Procedure Laterality Date  . APPENDECTOMY     1980s    Family History  Problem Relation Age of Onset  . Diabetes Mother   . Hyperlipidemia Father   . Hypertension Father   . Dementia Father   . Colon cancer Neg Hx   . Esophageal cancer Neg Hx   . Stomach cancer Neg Hx   . Rectal cancer Neg Hx     Social History:  reports that he has been smoking Cigarettes and Cigars.  He has been smoking about 0.25 packs per day. He has never used smokeless tobacco. He reports that he drinks alcohol. He reports that he does not use drugs.    Review of Systems       Hyperlipidemia: He has mixed hyperlipidemia and was taking Lipitor 20 mg, had muscle pain and has stopped this He is  taking 10 mg Crestor which he is tolerating without muscle pain and LDL is controlled   Lab Results  Component Value Date   CHOL  152 08/25/2016   HDL 30.70 (L) 08/25/2016   LDLCALC 90 08/25/2016   LDLDIRECT 118.0 10/09/2015   TRIG 157.0 (H) 08/25/2016   CHOLHDL 5 08/25/2016        He had painful burning sensation in his feet especially in the evenings, previously treated with gabapentin, Resolved.   Foot exam was normal in 4/16  HYPERTENSION: He had been taken off treatment when he started Invokana  BP Readings from Last 3 Encounters:  02/09/17 118/78  11/12/16 128/86  09/01/16 126/86     LABS:   Lab on 02/04/2017  Component Date Value Ref Range Status  . Hgb A1c MFr Bld 02/04/2017 7.7* 4.6 - 6.5 % Final   Glycemic Control Guidelines for People with Diabetes:Non Diabetic:  <6%Goal of Therapy: <7%Additional Action Suggested:  >8%   . Sodium 02/04/2017 136  135 - 145 mEq/L Final  . Potassium 02/04/2017 4.3  3.5 - 5.1 mEq/L Final  . Chloride 02/04/2017 105  96 - 112 mEq/L Final  . CO2 02/04/2017 26  19 - 32 mEq/L Final  . Glucose, Bld 02/04/2017 109* 70 - 99 mg/dL Final  . BUN 02/04/2017 18  6 - 23 mg/dL Final  . Creatinine, Ser 02/04/2017 1.10  0.40 - 1.50 mg/dL Final  . Calcium 02/04/2017 9.7  8.4 - 10.5 mg/dL Final  . GFR 02/04/2017 74.37  >60.00 mL/min Final    Physical Examination:  BP 118/78   Pulse 93   Ht 5\' 10"  (1.778 m)   Wt 279 lb (126.6 kg)   SpO2 95%   BMI 40.03 kg/m    ASSESSMENT/PLAN:  Diabetes type 2, uncontrolled  See history of present illness for detailed discussion of current diabetes management, blood sugar patterns and problems identified  His blood sugars are Still not controlled with A1c 7.7 Most likely has postprandial hyperglycemia at night which he is not monitoring and this is probably inconsistent also He  does have blood sugars as low as 121 in the morning He may be getting more insulin deficient as even with starting basal insulin and initial improvement his A1c is not improving and likely has postprandial hyperglycemia Overall compliance with diet and exercises fairly good  Recommendations made today:  He will try to use the freestyle Libre sensor and discussed in detail how this would be used  This may help him identify postprandial patterns and help him adjust his diet accordingly  Discussed that if he has consistently high readings after meals he will need to consider mealtime insulin  May also consider Ozempic instead of Victoza  Also may be a candidate for Cycloset  Reassess in 2 months   There are no Patient Instructions on file for this visit.   Maraki Macquarrie 02/10/2017, 8:38 AM

## 2017-03-08 ENCOUNTER — Other Ambulatory Visit: Payer: Self-pay | Admitting: Endocrinology

## 2017-04-04 ENCOUNTER — Other Ambulatory Visit: Payer: Self-pay | Admitting: Endocrinology

## 2017-04-11 ENCOUNTER — Ambulatory Visit (INDEPENDENT_AMBULATORY_CARE_PROVIDER_SITE_OTHER): Payer: BC Managed Care – PPO | Admitting: Endocrinology

## 2017-04-11 ENCOUNTER — Encounter: Payer: Self-pay | Admitting: Endocrinology

## 2017-04-11 ENCOUNTER — Other Ambulatory Visit: Payer: Self-pay | Admitting: Endocrinology

## 2017-04-11 VITALS — BP 136/100 | HR 106 | Ht 70.0 in | Wt 277.8 lb

## 2017-04-11 DIAGNOSIS — E1165 Type 2 diabetes mellitus with hyperglycemia: Secondary | ICD-10-CM

## 2017-04-11 NOTE — Progress Notes (Signed)
Patient ID: Greg Kane, male   DOB: 12-15-1965, 51 y.o.   MRN: [id number]   Reason for Appointment :  Follow up of Type 2 Diabetes  History of Present Illness          Diagnosis: Type 2 diabetes mellitus, date of diagnosis 2008  Background information: Initial diagnosis was made incidentally and had only mild hyperglycemia. Initially was treated with metformin alone and then Actos was added He has not had any formal diabetes education and has had difficulty losing weight His blood sugars have been only fairly well controlled with A1c usually in the 7-8 range Because of inadequate control in 2013 he was given Victoza injection in addition. However Actos was stopped  He thinks this helped bring his blood sugars better controlled but helped his portion control only slightly He did not lose much weight with Victoza In 6/14 he was given Invokana in addition and his Victoza was resumed.  RECENT history    Treatment regimen: Victoza 1.8mg , Jardiance 25 mg, metformin 2000 mg  Insulin: Levemir 16 units at night  He usually has difficulty with  getting consistent control of his diabetes despite multiple drugs  A1c is only down to 7.7, previously was 7.8 when he was started on bedtime insulin  Recent blood sugar patterns and problems identified:  He has started using the freestyle Libre sensor since his last visit and his download indicates he is getting blood sugar readings about 75% of the time over the last 2 weeks ending 7/5  His blood sugars are excellent throughout the day with HIGHEST readings around 9 PM after supper  However does not have consistent data in the evenings  FASTING readings are now more consistently around 130+ and not fluctuating much  He has sometimes taken 18 units of insulin at night if he thinks he is eating a larger portion are not watching his diet as well  He has lost another 2 pounds with continuing to exercise  Overall is also trying to  be careful with his diet and more so after starting the CGM  Only occasionally he has had a high reading after lunch  He is concerned about using multiple medications and is asking about insulin deficiency; is concerned about the cost of brand name medications especially Victoza  He thinks he can reduce his fingersticks to check his blood sugars since the CGM is expensive and he may be able to do his blood sugars more reliably at bedtime when he takes his insulin  Glucose monitoring: Using freestyle Libre currently, previously on Touch meter  once a day.     Blood Glucose readings from download:  Mean values apply above for all meters except median for One Touch  PRE-MEAL Fasting Lunch Dinner Bedtime Overall  Glucose range:       Mean/median: 135  116  123   134   POST-MEAL PC Breakfast PC Lunch PC Dinner  Glucose range:     Mean/median:  144  165       Physical activity: walking/Other exercises at the gym for up to 45 minutes, 3-4/7Days   Wt Readings from Last 3 Encounters:  04/11/17 277 lb 12.8 oz (126 kg)  02/09/17 279 lb (126.6 kg)  11/12/16 284 lb (128.8 kg)      Lab Results  Component Value Date   HGBA1C 7.7 (H) 02/04/2017   HGBA1C 7.8 (H) 08/25/2016   HGBA1C 7.2 (H) 05/26/2016   Lab Results  Component Value Date  MICROALBUR <0.7 08/25/2016   LDLCALC 90 08/25/2016   CREATININE 1.10 02/04/2017     Lab Results  Component Value Date   MICROALBUR <0.7 08/25/2016   No visits with results within 1 Week(s) from this visit.  Latest known visit with results is:  Lab on 02/04/2017  Component Date Value Ref Range Status  . Hgb A1c MFr Bld 02/04/2017 7.7* 4.6 - 6.5 % Final   Glycemic Control Guidelines for People with Diabetes:Non Diabetic:  <6%Goal of Therapy: <7%Additional Action Suggested:  >8%   . Sodium 02/04/2017 136  135 - 145 mEq/L Final  . Potassium 02/04/2017 4.3  3.5 - 5.1 mEq/L Final  . Chloride 02/04/2017 105  96 - 112 mEq/L Final  . CO2 02/04/2017  26  19 - 32 mEq/L Final  . Glucose, Bld 02/04/2017 109* 70 - 99 mg/dL Final  . BUN 02/04/2017 18  6 - 23 mg/dL Final  . Creatinine, Ser 02/04/2017 1.10  0.40 - 1.50 mg/dL Final  . Calcium 02/04/2017 9.7  8.4 - 10.5 mg/dL Final  . GFR 02/04/2017 74.37  >60.00 mL/min Final       Allergies as of 04/11/2017      Reactions   Penicillins Anaphylaxis   Influenza Virus Vaccine Split    cellulitis   Pneumococcal Vaccines Other (See Comments)   Cellulitis, swelling      Medication List       Accurate as of 04/11/17  4:02 PM. Always use your most recent med list.          allopurinol 100 MG tablet Commonly known as:  ZYLOPRIM Take 1 tablet (100 mg total) by mouth 2 (two) times daily as needed. For gout   FREESTYLE LIBRE READER Devi 1 Device by Does not apply route as directed.   [place] Misc Apply to upper arm and change sensor every 10 days   gabapentin 300 MG capsule Commonly known as:  NEURONTIN Take 1 capsule 3 times a day as needed   Insulin Detemir 100 UNIT/ML Pen Commonly known as:  LEVEMIR FLEXTOUCH Inject 10 Units into the skin daily at 10 pm.   Insulin Pen Needle 32G X 4 MM Misc Use to inject insulin once daily   JARDIANCE 25 MG Tabs tablet Generic drug:  empagliflozin TAKE 1 TABLET BY MOUTH DAILY   metFORMIN 500 MG 24 hr tablet Commonly known as:  GLUCOPHAGE-XR TAKE 2 TABLETS (1000MG ) TWOTIMES A DAY   ONE TOUCH ULTRA TEST test strip Generic drug:  glucose blood USE AS DIRECTED TO CHECK BLOOD SUGAR TWICE DAILY   ONETOUCH DELICA LANCETS 26R Misc Use to check blood sugar 2 times per day dx code E11.65   rosuvastatin 10 MG tablet Commonly known as:  CRESTOR TAKE 1 TABLET DAILY   VICTOZA 18 MG/3ML Sopn Generic drug:  liraglutide INJECT 1.8 MG DAILY       Allergies:  Allergies  Allergen Reactions  . Penicillins Anaphylaxis  . Influenza Virus Vaccine Split     cellulitis  . Pneumococcal Vaccines Other (See Comments)     Cellulitis, swelling    Past Medical History:  Diagnosis Date  . Allergy   . Bowel obstruction ([place]) 10/2011  . Diabetes mellitus   . Gout   . Hx of adenomatous polyp of colon 01/22/2015  . Hyperlipidemia   . Hypertension   . Umbilical hernia     Past Surgical History:  Procedure Laterality Date  . APPENDECTOMY     1980s  Family History  Problem Relation Age of Onset  . Diabetes Mother   . Hyperlipidemia Father   . Hypertension Father   . Dementia Father   . Colon cancer Neg Hx   . Esophageal cancer Neg Hx   . Stomach cancer Neg Hx   . Rectal cancer Neg Hx     Social History:  reports that he has been smoking Cigarettes and Cigars.  He has been smoking about 0.25 packs per day. He has never used smokeless tobacco. He reports that he drinks alcohol. He reports that he does not use drugs.    Review of Systems      Hyperlipidemia: He has mixed hyperlipidemia and was taking Lipitor 20 mg, had muscle pain and has stopped this He is  taking 10 mg Crestor which he is tolerating without muscle pain and LDL is controlled   Lab Results  Component Value Date   CHOL 152 08/25/2016   HDL 30.70 (L) 08/25/2016   LDLCALC 90 08/25/2016   LDLDIRECT 118.0 10/09/2015   TRIG 157.0 (H) 08/25/2016   CHOLHDL 5 08/25/2016        He had painful burning sensation in his feet especially in the evenings, previously treated with gabapentin, Resolved.   Foot exam was normal in 4/16  HYPERTENSION:  Has variable blood pressure readings in the office  BP Readings from Last 3 Encounters:  04/11/17 (!) 136/100  02/09/17 118/78  11/12/16 128/86     LABS:   No visits with results within 1 Week(s) from this visit.  Latest known visit with results is:  Lab on 02/04/2017  Component Date Value Ref Range Status  . Hgb A1c MFr Bld 02/04/2017 7.7* 4.6 - 6.5 % Final   Glycemic Control Guidelines for People with Diabetes:Non Diabetic:  <6%Goal of Therapy: <7%Additional Action Suggested:   >8%   . Sodium 02/04/2017 136  135 - 145 mEq/L Final  . Potassium 02/04/2017 4.3  3.5 - 5.1 mEq/L Final  . Chloride 02/04/2017 105  96 - 112 mEq/L Final  . CO2 02/04/2017 26  19 - 32 mEq/L Final  . Glucose, Bld 02/04/2017 109* 70 - 99 mg/dL Final  . BUN 02/04/2017 18  6 - 23 mg/dL Final  . Creatinine, Ser 02/04/2017 1.10  0.40 - 1.50 mg/dL Final  . Calcium 02/04/2017 9.7  8.4 - 10.5 mg/dL Final  . GFR 02/04/2017 74.37  >60.00 mL/min Final    Physical Examination:  BP (!) 136/100   Pulse (!) 106   Ht 5\' 10"  (1.778 m)   Wt 277 lb 12.8 oz (126 kg)   SpO2 94%   BMI 39.86 kg/m    ASSESSMENT/PLAN:  Diabetes type 2, uncontrolled  See history of present illness for detailed discussion of current diabetes management, blood sugar patterns and problems identified  His blood sugars are Looking much better with his continuous glucose monitoring and highest readings are about 165 after supper on an average Fasting readings are also better and he is only needing small amount of insulin at night to control overnight hypoglycemia He is doing better with diet and exercise regimen also  Recommendations made today:  He will go back to the One Touch since he cannot afford the freestyle [place]en[place]  He will still check sugar readings at night and not all in the morning  He will let us know if he is having higher sugars either fasting or after meals  Recommended that he continue multidrug regimen because of benefits in various  mechanisms for his type 2 diabetes  Follow-up in 3 months unless blood sugars start going up  ?  Hypertension: Needs to start checking blood pressure readings at home  There are no Patient Instructions on file for this visit.   Deval Mroczka 04/11/2017, 4:02 PM

## 2017-05-08 ENCOUNTER — Other Ambulatory Visit: Payer: Self-pay | Admitting: Endocrinology

## 2017-06-08 ENCOUNTER — Other Ambulatory Visit: Payer: Self-pay | Admitting: Endocrinology

## 2017-07-02 ENCOUNTER — Other Ambulatory Visit: Payer: Self-pay | Admitting: Endocrinology

## 2017-07-07 ENCOUNTER — Other Ambulatory Visit (INDEPENDENT_AMBULATORY_CARE_PROVIDER_SITE_OTHER): Payer: BC Managed Care – PPO

## 2017-07-07 ENCOUNTER — Other Ambulatory Visit: Payer: Self-pay | Admitting: Endocrinology

## 2017-07-07 DIAGNOSIS — E1165 Type 2 diabetes mellitus with hyperglycemia: Secondary | ICD-10-CM

## 2017-07-07 LAB — MICROALBUMIN / CREATININE URINE RATIO
CREATININE, U: 118.7 mg/dL
MICROALB/CREAT RATIO: 0.6 mg/g (ref 0.0–30.0)
Microalb, Ur: <0.7 mg/dL (ref 0.0–1.9)

## 2017-07-07 LAB — COMPREHENSIVE METABOLIC PANEL
ALT: 25 U/L (ref 0–53)
AST: 20 U/L (ref 0–37)
Albumin: 4.5 g/dL (ref 3.5–5.2)
Alkaline Phosphatase: 54 U/L (ref 39–117)
BUN: 18 mg/dL (ref 6–23)
CALCIUM: 9.4 mg/dL (ref 8.4–10.5)
CHLORIDE: 104 meq/L (ref 96–112)
CO2: 26 meq/L (ref 19–32)
CREATININE: 1.11 mg/dL (ref 0.40–1.50)
GFR: 73.48 mL/min (ref >60.00)
GLUCOSE: 124 mg/dL — AB (ref 70–99)
Potassium: 4.4 mEq/L (ref 3.5–5.1)
Sodium: 138 mEq/L (ref 135–145)
Total Bilirubin: 0.7 mg/dL (ref 0.2–1.2)
Total Protein: 7.2 g/dL (ref 6.0–8.3)

## 2017-07-07 LAB — LIPID PANEL
Cholesterol: 145 mg/dL (ref 0–200)
HDL: 28.4 mg/dL — AB (ref >39.00)
LDL Cholesterol: 85 mg/dL (ref 0–99)
NonHDL: 116.39
TRIGLYCERIDES: 158 mg/dL — AB (ref 0.0–149.0)
Total CHOL/HDL Ratio: 5
VLDL: 31.6 mg/dL (ref 0.0–40.0)

## 2017-07-07 LAB — HEMOGLOBIN A1C: Hgb A1c MFr Bld: 7.2 % — ABNORMAL HIGH (ref 4.6–6.5)

## 2017-07-08 LAB — FRUCTOSAMINE: Fructosamine: 242 umol/L (ref 0–285)

## 2017-07-12 ENCOUNTER — Ambulatory Visit (INDEPENDENT_AMBULATORY_CARE_PROVIDER_SITE_OTHER): Payer: BC Managed Care – PPO | Admitting: Endocrinology

## 2017-07-12 ENCOUNTER — Encounter: Payer: Self-pay | Admitting: Endocrinology

## 2017-07-12 VITALS — BP 140/90 | HR 93 | Ht 70.0 in | Wt 275.0 lb

## 2017-07-12 DIAGNOSIS — Z794 Long term (current) use of insulin: Secondary | ICD-10-CM

## 2017-07-12 DIAGNOSIS — E1165 Type 2 diabetes mellitus with hyperglycemia: Secondary | ICD-10-CM | POA: Diagnosis not present

## 2017-07-12 NOTE — Progress Notes (Signed)
Patient ID: Greg Kane, male   DOB: 09/17/1966, 51 y.o.   MRN: [id number]   Reason for Appointment :  Follow up of Type 2 Diabetes  History of Present Illness          Diagnosis: Type 2 diabetes mellitus, date of diagnosis 2008  Background information: Initial diagnosis was made incidentally and had only mild hyperglycemia. Initially was treated with metformin alone and then Actos was added He has not had any formal diabetes education and has had difficulty losing weight His blood sugars have been only fairly well controlled with A1c usually in the 7-8 range Because of inadequate control in 2013 he was given Victoza injection in addition. However Actos was stopped  He thinks this helped bring his blood sugars better controlled but helped his portion control only slightly He did not lose much weight with Victoza In 6/14 he was given Invokana in addition and his Victoza was resumed.  RECENT history    Treatment regimen: Victoza 1.8mg , Jardiance 25 mg, metformin 2000 mg  Insulin: Levemir 18 units at night  A1c is down to 7.2, previously 7.7  Recent blood sugar patterns and problems identified:  He has been fairly insistent with trying to exercise and usually trying to eat healthy  Although he did not bring his monitor for download he thinks his blood sugars are generally fairly consistent  Still has mildly increased blood sugars overall in the morning fasting even though at times it maybe down to 120 and his lab fasting was 124  He has not changed his Levemir which he takes consistently at night  Previously with the freestyle Libre sensor his highest blood sugars on average were around 9 PM but still overall averaging 134  Probably is checking blood sugars mostly in the mornings and not many after meals  He thinks blood sugars are the lowest before supper time without any hypoglycemic symptoms Only occasionally his blood sugars and be over 160 at night, he thinks  this is with trying to be consistent with carbohydrate intake   Glucose monitoring: Using  Touch ultra meter  once a day.     Blood Glucose readings from recall  Mean values apply above for all meters except median for One Touch  PRE-MEAL Fasting Lunch Dinner Bedtime Overall  Glucose range: 120-160    120-230   Mean/median: 140   150     Physical activity: walking/Other exercises at the gym for up to 45 minutes, 3-4/7Days   Wt Readings from Last 3 Encounters:  07/12/17 275 lb (124.7 kg)  04/11/17 277 lb 12.8 oz (126 kg)  02/09/17 279 lb (126.6 kg)      Lab Results  Component Value Date   HGBA1C 7.2 (H) 07/07/2017   HGBA1C 7.7 (H) 02/04/2017   HGBA1C 7.8 (H) 08/25/2016   Lab Results  Component Value Date   MICROALBUR <0.7 07/07/2017   [place] 85 07/07/2017   CREATININE 1.11 07/07/2017     Lab Results  Component Value Date   MICROALBUR <0.7 07/07/2017   Lab on 07/07/2017  Component Date Value Ref Range Status  . Fructosamine 07/07/2017 242  0 - 285 umol/L Final   Comment: Published reference interval for apparently healthy subjects between age 76 and 68 is 28 - 285 umol/L and in a poorly controlled diabetic population is 228 - 563 umol/L with a mean of 396 umol/L.   Marland Kitchen Hgb A1c MFr Bld 07/07/2017 7.2* 4.6 - 6.5 % Final  Glycemic Control Guidelines for People with Diabetes:Non Diabetic:  <6%Goal of Therapy: <7%Additional Action Suggested:  >8%   . Sodium 07/07/2017 138  135 - 145 mEq/L Final  . Potassium 07/07/2017 4.4  3.5 - 5.1 mEq/L Final  . Chloride 07/07/2017 104  96 - 112 mEq/L Final  . CO2 07/07/2017 26  19 - 32 mEq/L Final  . Glucose, Bld 07/07/2017 124* 70 - 99 mg/dL Final  . BUN 07/07/2017 18  6 - 23 mg/dL Final  . Creatinine, Ser 07/07/2017 1.11  0.40 - 1.50 mg/dL Final  . Total Bilirubin 07/07/2017 0.7  0.2 - 1.2 mg/dL Final  . Alkaline Phosphatase 07/07/2017 54  39 - 117 U/L Final  . AST 07/07/2017 20  0 - 37 U/L Final  . ALT 07/07/2017 25  0 - 53  U/L Final  . Total Protein 07/07/2017 7.2  6.0 - 8.3 g/dL Final  . Albumin 07/07/2017 4.5  3.5 - 5.2 g/dL Final  . Calcium 07/07/2017 9.4  8.4 - 10.5 mg/dL Final  . GFR 07/07/2017 73.48  >60.00 mL/min Final  . Microalb, Ur 07/07/2017 <0.7  0.0 - 1.9 mg/dL Final  . Creatinine,U 07/07/2017 118.7  mg/dL Final  . Microalb Creat Ratio 07/07/2017 0.6  0.0 - 30.0 mg/g Final  . Cholesterol 07/07/2017 145  0 - 200 mg/dL Final   ATP III Classification       Desirable:  < 200 mg/dL               Borderline High:  200 - 239 mg/dL          High:  > = 240 mg/dL  . Triglycerides 07/07/2017 158.0* 0.0 - 149.0 mg/dL Final   Normal:  <150 mg/dLBorderline High:  150 - 199 mg/dL  . HDL 07/07/2017 28.40* >39.00 mg/dL Final  . VLDL 07/07/2017 31.6  0.0 - 40.0 mg/dL Final  . LDL Cholesterol 07/07/2017 85  0 - 99 mg/dL Final  . Total CHOL/HDL Ratio 07/07/2017 5   Final                  Men          Women1/2 Average Risk     3.4          3.3Average Risk          5.0          4.42X Average Risk          9.6          7.13X Average Risk          15.0          11.0                      . NonHDL 07/07/2017 116.39   Final   NOTE:  Non-HDL goal should be 30 mg/dL higher than patient's LDL goal (i.e. LDL goal of < 70 mg/dL, would have non-HDL goal of < 100 mg/dL)       Allergies as of 07/12/2017      Reactions   Penicillins Anaphylaxis   Influenza Vac Split Quad    cellulitis   Pneumococcal Vaccines Other (See Comments)   Cellulitis, swelling      Medication List       Accurate as of 07/12/17  8:59 AM. Always use your most recent med list.          allopurinol 100 MG tablet Commonly known as:  ZYLOPRIM Take  1 tablet (100 mg total) by mouth 2 (two) times daily as needed. For gout   gabapentin 300 MG capsule Commonly known as:  NEURONTIN Take 1 capsule 3 times a day as needed   Insulin Detemir 100 UNIT/ML Pen Commonly known as:  LEVEMIR FLEXTOUCH Inject 10 Units into the skin daily at 10 pm.   Insulin  Pen Needle 32G X 4 MM Misc Use to inject insulin once daily   JARDIANCE 25 MG Tabs tablet Generic drug:  empagliflozin TAKE 1 TABLET BY MOUTH DAILY   metFORMIN 500 MG 24 hr tablet Commonly known as:  GLUCOPHAGE-XR TAKE 2 TABLETS (1000MG ) TWOTIMES A DAY   ONE TOUCH ULTRA TEST test strip Generic drug:  glucose blood USE AS DIRECTED TO CHECK BLOOD SUGAR TWICE DAILY   ONETOUCH DELICA LANCETS 25Z Misc Use to check blood sugar 2 times per day dx code E11.65   rosuvastatin 10 MG tablet Commonly known as:  CRESTOR TAKE 1 TABLET DAILY   VICTOZA 18 MG/3ML Sopn Generic drug:  liraglutide INJECT 1.8 MG DAILY       Allergies:  Allergies  Allergen Reactions  . Penicillins Anaphylaxis  . Influenza Vac Split Quad     cellulitis  . Pneumococcal Vaccines Other (See Comments)    Cellulitis, swelling    Past Medical History:  Diagnosis Date  . Allergy   . Bowel obstruction ([place]) 10/2011  . Diabetes mellitus   . Gout   . Hx of adenomatous polyp of colon 01/22/2015  . Hyperlipidemia   . Hypertension   . Umbilical hernia     Past Surgical History:  Procedure Laterality Date  . APPENDECTOMY     1980s    Family History  Problem Relation Age of Onset  . Diabetes Mother   . Hyperlipidemia Father   . Hypertension Father   . Dementia Father   . Colon cancer Neg Hx   . Esophageal cancer Neg Hx   . Stomach cancer Neg Hx   . Rectal cancer Neg Hx     Social History:  reports that he has been smoking Cigarettes and Cigars.  He has been smoking about 0.25 packs per day. He has never used smokeless tobacco. He reports that he drinks alcohol. He reports that he does not use drugs.    Review of Systems      Hyperlipidemia: He has mixed hyperlipidemia He is  taking 10 mg Crestor which he is tolerating without muscle pain and LDL is controlled Previously on Lipitor which caused muscle aches HDL is again low  Lab Results  Component Value Date   CHOL 145 07/07/2017   HDL 28.40  (L) 07/07/2017   LDLCALC 85 07/07/2017   LDLDIRECT 118.0 10/09/2015   TRIG 158.0 (H) 07/07/2017   CHOLHDL 5 07/07/2017        He had painful burning sensation in his feet occasionally when he is sitting or standing for long, usually not taking gabapentin now   Foot exam was normal in 10/18  HYPERTENSION:  Has variable blood pressure readings in the office  BP Readings from Last 3 Encounters:  07/12/17 140/90  04/11/17 (!) 136/100  02/09/17 118/78     LABS:   Lab on 07/07/2017  Component Date Value Ref Range Status  . Fructosamine 07/07/2017 242  0 - 285 umol/L Final   Comment: Published reference interval for apparently healthy subjects between age 47 and 87 is 100 - 285 umol/L and in a poorly controlled diabetic population is 228 -  563 umol/L with a mean of 396 umol/L.   Marland Kitchen Hgb A1c MFr Bld 07/07/2017 7.2* 4.6 - 6.5 % Final   Glycemic Control Guidelines for People with Diabetes:Non Diabetic:  <6%Goal of Therapy: <7%Additional Action Suggested:  >8%   . Sodium 07/07/2017 138  135 - 145 mEq/L Final  . Potassium 07/07/2017 4.4  3.5 - 5.1 mEq/L Final  . Chloride 07/07/2017 104  96 - 112 mEq/L Final  . CO2 07/07/2017 26  19 - 32 mEq/L Final  . Glucose, Bld 07/07/2017 124* 70 - 99 mg/dL Final  . BUN 07/07/2017 18  6 - 23 mg/dL Final  . Creatinine, Ser 07/07/2017 1.11  0.40 - 1.50 mg/dL Final  . Total Bilirubin 07/07/2017 0.7  0.2 - 1.2 mg/dL Final  . Alkaline Phosphatase 07/07/2017 54  39 - 117 U/L Final  . AST 07/07/2017 20  0 - 37 U/L Final  . ALT 07/07/2017 25  0 - 53 U/L Final  . Total Protein 07/07/2017 7.2  6.0 - 8.3 g/dL Final  . Albumin 07/07/2017 4.5  3.5 - 5.2 g/dL Final  . Calcium 07/07/2017 9.4  8.4 - 10.5 mg/dL Final  . GFR 07/07/2017 73.48  >60.00 mL/min Final  . Microalb, Ur 07/07/2017 <0.7  0.0 - 1.9 mg/dL Final  . Creatinine,U 07/07/2017 118.7  mg/dL Final  . Microalb Creat Ratio 07/07/2017 0.6  0.0 - 30.0 mg/g Final  . Cholesterol 07/07/2017 145  0 - 200  mg/dL Final   ATP III Classification       Desirable:  < 200 mg/dL               Borderline High:  200 - 239 mg/dL          High:  > = 240 mg/dL  . Triglycerides 07/07/2017 158.0* 0.0 - 149.0 mg/dL Final   Normal:  <150 mg/dLBorderline High:  150 - 199 mg/dL  . HDL 07/07/2017 28.40* >39.00 mg/dL Final  . VLDL 07/07/2017 31.6  0.0 - 40.0 mg/dL Final  . LDL Cholesterol 07/07/2017 85  0 - 99 mg/dL Final  . Total CHOL/HDL Ratio 07/07/2017 5   Final                  Men          Women1/2 Average Risk     3.4          3.3Average Risk          5.0          4.42X Average Risk          9.6          7.13X Average Risk          15.0          11.0                      . NonHDL 07/07/2017 116.39   Final   NOTE:  Non-HDL goal should be 30 mg/dL higher than patient's LDL goal (i.e. LDL goal of < 70 mg/dL, would have non-HDL goal of < 100 mg/dL)    Physical Examination:  BP 140/90   Pulse 93   Ht 5\' 10"  (1.778 m)   Wt 275 lb (124.7 kg)   SpO2 96%   BMI 39.46 kg/m   Diabetic Foot Exam - Simple   Simple Foot Form Diabetic Foot exam was performed with the following findings:  Yes 07/12/2017  8:27 AM  Visual Inspection No deformities, no ulcerations, no other skin breakdown bilaterally:  Yes Sensation Testing Intact to touch and monofilament testing bilaterally:  Yes Pulse Check Posterior Tibialis and Dorsalis pulse intact bilaterally:  Yes Comments      ASSESSMENT/PLAN:  Diabetes type 2, uncontrolled  See history of present illness for detailed discussion of current diabetes management, blood sugar patterns and problems identified  His blood sugars are generally fairly controlled He has had better control and A1c is down to 7.2 with his trying to be consistent with diet and exercise Still having relatively high readings overall fasting and occasionally after dinner  Recommendations made today:  He will increase his Levemir if fasting readings are consistently high  No change in overall  medication regimen otherwise  Reminded him to check readings after meals consistently at night and sometimes after breakfast  Follow-up in 3 months unless blood sugars start going up   LIPIDS: Well controlled except for low HDL, continue Crestor which he is tolerating   He refuses influenza vaccine  ?  Hypertension: Needs to follow-up with PCP  There are no Patient Instructions on file for this visit.   Erick Murin 07/12/2017, 8:59 AM

## 2017-08-13 ENCOUNTER — Other Ambulatory Visit: Payer: Self-pay | Admitting: Endocrinology

## 2017-10-02 ENCOUNTER — Other Ambulatory Visit: Payer: Self-pay | Admitting: Endocrinology

## 2017-11-13 NOTE — Progress Notes (Deleted)
Patient ID: Greg Kane, male   DOB: 02-12-1966, 52 y.o.   MRN: [id number]   Reason for Appointment :  Follow up of Type 2 Diabetes  History of Present Illness          Diagnosis: Type 2 diabetes mellitus, date of diagnosis 2008  Background information: Initial diagnosis was made incidentally and had only mild hyperglycemia. Initially was treated with metformin alone and then Actos was added He has not had any formal diabetes education and has had difficulty losing weight His blood sugars have been only fairly well controlled with A1c usually in the 7-8 range Because of inadequate control in 2013 he was given Victoza injection in addition. However Actos was stopped  He thinks this helped bring his blood sugars better controlled but helped his portion control only slightly He did not lose much weight with Victoza In 6/14 he was given Invokana in addition and his Victoza was resumed.  RECENT history    Treatment regimen: Victoza 1.8mg , Jardiance 25 mg, metformin 2000 mg  Insulin: Levemir 18 units at night  A1c is down to 7.2, previously 7.7  Recent blood sugar patterns and problems identified:  He has been fairly insistent with trying to exercise and usually trying to eat healthy  Although he did not bring his monitor for download he thinks his blood sugars are generally fairly consistent  Still has mildly increased blood sugars overall in the morning fasting even though at times it maybe down to 120 and his lab fasting was 124  He has not changed his Levemir which he takes consistently at night  Previously with the freestyle Libre sensor his highest blood sugars on average were around 9 PM but still overall averaging 134  Probably is checking blood sugars mostly in the mornings and not many after meals  He thinks blood sugars are the lowest before supper time without any hypoglycemic symptoms Only occasionally his blood sugars and be over 160 at night, he thinks  this is with trying to be consistent with carbohydrate intake   Glucose monitoring: Using  Touch ultra meter  once a day.     Blood Glucose readings from recall  Mean values apply above for all meters except median for One Touch  PRE-MEAL Fasting Lunch Dinner Bedtime Overall  Glucose range: 120-160    120-230   Mean/median: 140   150     Physical activity: walking/Other exercises at the gym for up to 45 minutes, 3-4/7Days   Wt Readings from Last 3 Encounters:  07/12/17 275 lb (124.7 kg)  04/11/17 277 lb 12.8 oz (126 kg)  02/09/17 279 lb (126.6 kg)      Lab Results  Component Value Date   HGBA1C 7.2 (H) 07/07/2017   HGBA1C 7.7 (H) 02/04/2017   HGBA1C 7.8 (H) 08/25/2016   Lab Results  Component Value Date   MICROALBUR <0.7 07/07/2017   [place] 85 07/07/2017   CREATININE 1.11 07/07/2017     Lab Results  Component Value Date   MICROALBUR <0.7 07/07/2017   No visits with results within 1 Week(s) from this visit.  Latest known visit with results is:  Lab on 07/07/2017  Component Date Value Ref Range Status  . Fructosamine 07/07/2017 242  0 - 285 umol/L Final   Comment: Published reference interval for apparently healthy subjects between age 100 and 39 is 31 - 285 umol/L and in a poorly controlled diabetic population is 228 - 563 umol/L with a mean of  396 umol/L.   Marland Kitchen Hgb A1c MFr Bld 07/07/2017 7.2* 4.6 - 6.5 % Final   Glycemic Control Guidelines for People with Diabetes:Non Diabetic:  <6%Goal of Therapy: <7%Additional Action Suggested:  >8%   . Sodium 07/07/2017 138  135 - 145 mEq/L Final  . Potassium 07/07/2017 4.4  3.5 - 5.1 mEq/L Final  . Chloride 07/07/2017 104  96 - 112 mEq/L Final  . CO2 07/07/2017 26  19 - 32 mEq/L Final  . Glucose, Bld 07/07/2017 124* 70 - 99 mg/dL Final  . BUN 07/07/2017 18  6 - 23 mg/dL Final  . Creatinine, Ser 07/07/2017 1.11  0.40 - 1.50 mg/dL Final  . Total Bilirubin 07/07/2017 0.7  0.2 - 1.2 mg/dL Final  . Alkaline Phosphatase  07/07/2017 54  39 - 117 U/L Final  . AST 07/07/2017 20  0 - 37 U/L Final  . ALT 07/07/2017 25  0 - 53 U/L Final  . Total Protein 07/07/2017 7.2  6.0 - 8.3 g/dL Final  . Albumin 07/07/2017 4.5  3.5 - 5.2 g/dL Final  . Calcium 07/07/2017 9.4  8.4 - 10.5 mg/dL Final  . GFR 07/07/2017 73.48  >60.00 mL/min Final  . Microalb, Ur 07/07/2017 <0.7  0.0 - 1.9 mg/dL Final  . Creatinine,U 07/07/2017 118.7  mg/dL Final  . Microalb Creat Ratio 07/07/2017 0.6  0.0 - 30.0 mg/g Final  . Cholesterol 07/07/2017 145  0 - 200 mg/dL Final   ATP III Classification       Desirable:  < 200 mg/dL               Borderline High:  200 - 239 mg/dL          High:  > = 240 mg/dL  . Triglycerides 07/07/2017 158.0* 0.0 - 149.0 mg/dL Final   Normal:  <150 mg/dLBorderline High:  150 - 199 mg/dL  . HDL 07/07/2017 28.40* >39.00 mg/dL Final  . VLDL 07/07/2017 31.6  0.0 - 40.0 mg/dL Final  . LDL Cholesterol 07/07/2017 85  0 - 99 mg/dL Final  . Total CHOL/HDL Ratio 07/07/2017 5   Final                  Men          Women1/2 Average Risk     3.4          3.3Average Risk          5.0          4.42X Average Risk          9.6          7.13X Average Risk          15.0          11.0                      . NonHDL 07/07/2017 116.39   Final   NOTE:  Non-HDL goal should be 30 mg/dL higher than patient's LDL goal (i.e. LDL goal of < 70 mg/dL, would have non-HDL goal of < 100 mg/dL)       Allergies as of 11/14/2017      Reactions   Penicillins Anaphylaxis   Influenza Vac Split Quad    cellulitis   Pneumococcal Vaccines Other (See Comments)   Cellulitis, swelling      Medication List        Accurate as of 11/13/17  8:04 PM. Always use your most recent med list.  allopurinol 100 MG tablet Commonly known as:  ZYLOPRIM Take 1 tablet (100 mg total) by mouth 2 (two) times daily as needed. For gout   gabapentin 300 MG capsule Commonly known as:  NEURONTIN Take 1 capsule 3 times a day as needed   Insulin Detemir 100  UNIT/ML Pen Commonly known as:  LEVEMIR FLEXTOUCH Inject 10 Units into the skin daily at 10 pm.   Insulin Pen Needle 32G X 4 MM Misc Use to inject insulin once daily   JARDIANCE 25 MG Tabs tablet Generic drug:  empagliflozin TAKE 1 TABLET BY MOUTH DAILY   metFORMIN 500 MG 24 hr tablet Commonly known as:  GLUCOPHAGE-XR TAKE 2 TABLETS (1000MG ) TWOTIMES A DAY   ONE TOUCH ULTRA TEST test strip Generic drug:  glucose blood USE AS DIRECTED TO CHECK BLOOD SUGAR TWICE DAILY   ONETOUCH DELICA LANCETS 33I Misc Use to check blood sugar 2 times per day dx code E11.65   rosuvastatin 10 MG tablet Commonly known as:  CRESTOR TAKE 1 TABLET DAILY   VICTOZA 18 MG/3ML Sopn Generic drug:  liraglutide INJECT 1.8 MG DAILY       Allergies:  Allergies  Allergen Reactions  . Penicillins Anaphylaxis  . Influenza Vac Split Quad     cellulitis  . Pneumococcal Vaccines Other (See Comments)    Cellulitis, swelling    Past Medical History:  Diagnosis Date  . Allergy   . Bowel obstruction ([place]) 10/2011  . Diabetes mellitus   . Gout   . Hx of adenomatous polyp of colon 01/22/2015  . Hyperlipidemia   . Hypertension   . Umbilical hernia     Past Surgical History:  Procedure Laterality Date  . APPENDECTOMY     1980s    Family History  Problem Relation Age of Onset  . Diabetes Mother   . Hyperlipidemia Father   . Hypertension Father   . Dementia Father   . Colon cancer Neg Hx   . Esophageal cancer Neg Hx   . Stomach cancer Neg Hx   . Rectal cancer Neg Hx     Social History:  reports that he has been smoking cigarettes and cigars.  He has been smoking about 0.25 packs per day. he has never used smokeless tobacco. He reports that he drinks alcohol. He reports that he does not use drugs.    Review of Systems      Hyperlipidemia: He has mixed hyperlipidemia He is  taking 10 mg Crestor which he is tolerating without muscle pain and LDL is controlled Previously on Lipitor which  caused muscle aches HDL is again low  Lab Results  Component Value Date   CHOL 145 07/07/2017   HDL 28.40 (L) 07/07/2017   LDLCALC 85 07/07/2017   LDLDIRECT 118.0 10/09/2015   TRIG 158.0 (H) 07/07/2017   CHOLHDL 5 07/07/2017        He had painful burning sensation in his feet occasionally when he is sitting or standing for long, usually not taking gabapentin now   Foot exam was normal in 10/18  HYPERTENSION:  Has variable blood pressure readings in the office  BP Readings from Last 3 Encounters:  07/12/17 140/90  04/11/17 (!) 136/100  02/09/17 118/78     LABS:   No visits with results within 1 Week(s) from this visit.  Latest known visit with results is:  Lab on 07/07/2017  Component Date Value Ref Range Status  . Fructosamine 07/07/2017 242  0 - 285 umol/L Final  Comment: Published reference interval for apparently healthy subjects between age 69 and 10 is 51 - 285 umol/L and in a poorly controlled diabetic population is 228 - 563 umol/L with a mean of 396 umol/L.   Marland Kitchen Hgb A1c MFr Bld 07/07/2017 7.2* 4.6 - 6.5 % Final   Glycemic Control Guidelines for People with Diabetes:Non Diabetic:  <6%Goal of Therapy: <7%Additional Action Suggested:  >8%   . Sodium 07/07/2017 138  135 - 145 mEq/L Final  . Potassium 07/07/2017 4.4  3.5 - 5.1 mEq/L Final  . Chloride 07/07/2017 104  96 - 112 mEq/L Final  . CO2 07/07/2017 26  19 - 32 mEq/L Final  . Glucose, Bld 07/07/2017 124* 70 - 99 mg/dL Final  . BUN 07/07/2017 18  6 - 23 mg/dL Final  . Creatinine, Ser 07/07/2017 1.11  0.40 - 1.50 mg/dL Final  . Total Bilirubin 07/07/2017 0.7  0.2 - 1.2 mg/dL Final  . Alkaline Phosphatase 07/07/2017 54  39 - 117 U/L Final  . AST 07/07/2017 20  0 - 37 U/L Final  . ALT 07/07/2017 25  0 - 53 U/L Final  . Total Protein 07/07/2017 7.2  6.0 - 8.3 g/dL Final  . Albumin 07/07/2017 4.5  3.5 - 5.2 g/dL Final  . Calcium 07/07/2017 9.4  8.4 - 10.5 mg/dL Final  . GFR 07/07/2017 73.48  >60.00 mL/min  Final  . Microalb, Ur 07/07/2017 <0.7  0.0 - 1.9 mg/dL Final  . Creatinine,U 07/07/2017 118.7  mg/dL Final  . Microalb Creat Ratio 07/07/2017 0.6  0.0 - 30.0 mg/g Final  . Cholesterol 07/07/2017 145  0 - 200 mg/dL Final   ATP III Classification       Desirable:  < 200 mg/dL               Borderline High:  200 - 239 mg/dL          High:  > = 240 mg/dL  . Triglycerides 07/07/2017 158.0* 0.0 - 149.0 mg/dL Final   Normal:  <150 mg/dLBorderline High:  150 - 199 mg/dL  . HDL 07/07/2017 28.40* >39.00 mg/dL Final  . VLDL 07/07/2017 31.6  0.0 - 40.0 mg/dL Final  . LDL Cholesterol 07/07/2017 85  0 - 99 mg/dL Final  . Total CHOL/HDL Ratio 07/07/2017 5   Final                  Men          Women1/2 Average Risk     3.4          3.3Average Risk          5.0          4.42X Average Risk          9.6          7.13X Average Risk          15.0          11.0                      . NonHDL 07/07/2017 116.39   Final   NOTE:  Non-HDL goal should be 30 mg/dL higher than patient's LDL goal (i.e. LDL goal of < 70 mg/dL, would have non-HDL goal of < 100 mg/dL)    Physical Examination:  There were no vitals taken for this visit.  Diabetic Foot Exam - Simple   No data filed       ASSESSMENT/PLAN:  Diabetes type  2, uncontrolled  See history of present illness for detailed discussion of current diabetes management, blood sugar patterns and problems identified  His blood sugars are generally fairly controlled He has had better control and A1c is down to 7.2 with his trying to be consistent with diet and exercise Still having relatively high readings overall fasting and occasionally after dinner  Recommendations made today:  He will increase his Levemir if fasting readings are consistently high  No change in overall medication regimen otherwise  Reminded him to check readings after meals consistently at night and sometimes after breakfast  Follow-up in 3 months unless blood sugars start going up    LIPIDS: Well controlled except for low HDL, continue Crestor which he is tolerating   He refuses influenza vaccine  ?  Hypertension: Needs to follow-up with PCP  There are no Patient Instructions on file for this visit.   Elayne Snare 11/13/2017, 8:04 PM

## 2017-11-14 ENCOUNTER — Ambulatory Visit: Payer: BC Managed Care – PPO | Admitting: Endocrinology

## 2017-11-14 ENCOUNTER — Other Ambulatory Visit: Payer: BC Managed Care – PPO

## 2017-12-05 ENCOUNTER — Other Ambulatory Visit (INDEPENDENT_AMBULATORY_CARE_PROVIDER_SITE_OTHER): Payer: BC Managed Care – PPO

## 2017-12-05 DIAGNOSIS — Z794 Long term (current) use of insulin: Secondary | ICD-10-CM

## 2017-12-05 DIAGNOSIS — E1165 Type 2 diabetes mellitus with hyperglycemia: Secondary | ICD-10-CM

## 2017-12-05 LAB — COMPREHENSIVE METABOLIC PANEL
ALK PHOS: 59 U/L (ref 39–117)
ALT: 27 U/L (ref 0–53)
AST: 21 U/L (ref 0–37)
Albumin: 4.3 g/dL (ref 3.5–5.2)
BILIRUBIN TOTAL: 0.6 mg/dL (ref 0.2–1.2)
BUN: 20 mg/dL (ref 6–23)
CO2: 25 meq/L (ref 19–32)
Calcium: 9.8 mg/dL (ref 8.4–10.5)
Chloride: 103 mEq/L (ref 96–112)
Creatinine, Ser: 1.05 mg/dL (ref 0.40–1.50)
GFR: 78.23 mL/min (ref >60.00)
GLUCOSE: 119 mg/dL — AB (ref 70–99)
Potassium: 4.3 mEq/L (ref 3.5–5.1)
SODIUM: 137 meq/L (ref 135–145)
TOTAL PROTEIN: 7.5 g/dL (ref 6.0–8.3)

## 2017-12-05 LAB — HEMOGLOBIN A1C: Hgb A1c MFr Bld: 7.9 % — ABNORMAL HIGH (ref 4.6–6.5)

## 2017-12-08 ENCOUNTER — Encounter: Payer: Self-pay | Admitting: Endocrinology

## 2017-12-08 ENCOUNTER — Ambulatory Visit: Payer: BC Managed Care – PPO | Admitting: Endocrinology

## 2017-12-08 VITALS — BP 116/80 | HR 91 | Wt 280.0 lb

## 2017-12-08 DIAGNOSIS — E1165 Type 2 diabetes mellitus with hyperglycemia: Secondary | ICD-10-CM | POA: Diagnosis not present

## 2017-12-08 DIAGNOSIS — Z794 Long term (current) use of insulin: Secondary | ICD-10-CM | POA: Diagnosis not present

## 2017-12-08 MED ORDER — EMPAGLIFLOZIN 25 MG PO TABS: 25.0000 mg | ORAL_TABLET | Freq: Every day | ORAL | 3 refills | Status: DC

## 2017-12-08 MED ORDER — INSULIN DEGLUDEC 100 UNIT/ML ~~LOC~~ SOPN: 20.0000 [IU] | PEN_INJECTOR | Freq: Every day | SUBCUTANEOUS | 1 refills | Status: DC

## 2017-12-08 NOTE — Progress Notes (Signed)
Patient ID: Greg Kane, male   DOB: 1966/09/09, 52 y.o.   MRN: [id number]   Reason for Appointment :  Follow up of Type 2 Diabetes  History of Present Illness          Diagnosis: Type 2 diabetes mellitus, date of diagnosis 2008  Background information: Initial diagnosis was made incidentally and had only mild hyperglycemia. Initially was treated with metformin alone and then Actos was added He has not had any formal diabetes education and has had difficulty losing weight His blood sugars have been only fairly well controlled with A1c usually in the 7-8 range Because of inadequate control in 2013 he was given Victoza injection in addition. However Actos was stopped  He thinks this helped bring his blood sugars better controlled but helped his portion control only slightly He did not lose much weight with Victoza In 6/14 he was given Invokana in addition and his Victoza was resumed.  RECENT history    Treatment regimen: Victoza 1.8mg , Jardiance 25 mg, metformin 2000 mg  Insulin: Levemir 18 units at night  A1c is back up to 7.9, previously down to 7.2  Recent blood sugar patterns and problems identified:  He has not been seen in follow-up since October  He has had intercurrent issues with ankle fracture, wedding and death in the family over the last few months and has been totally noncompliant with his lifestyle  Also he ran out of his insulin and has not taken any Levemir for the last 3 weeks  As expected his fasting blood sugars are higher; surprisingly lab glucose late morning without breakfast was only 119  His blood sugars are being checked in the morning around 6 AM  Only recently has started a little walking with healing his ankle problem  He has been eating more sweets especially over the holidays and now starting to cut back on these and get more vegetables  Has been consistent with taking his other 3 medications  Glucose monitoring: Using  Touch ultra  meter less than once a day.     Blood Glucose readings from download:  Mean values apply above for all meters except median for One Touch  PRE-MEAL Fasting Lunch Dinner Bedtime Overall  Glucose range:  142-182  156  142    Mean/median: 164       Physical activity: walking/Other exercises at the gym for up to 45 minutes, 3-4/7Days   Wt Readings from Last 3 Encounters:  12/08/17 280 lb (127 kg)  07/12/17 275 lb (124.7 kg)  04/11/17 277 lb 12.8 oz (126 kg)      Lab Results  Component Value Date   HGBA1C 7.9 (H) 12/05/2017   HGBA1C 7.2 (H) 07/07/2017   HGBA1C 7.7 (H) 02/04/2017   Lab Results  Component Value Date   MICROALBUR <0.7 07/07/2017   [place] 85 07/07/2017   CREATININE 1.05 12/05/2017     Lab Results  Component Value Date   MICROALBUR <0.7 07/07/2017   Lab on 12/05/2017  Component Date Value Ref Range Status  . Sodium 12/05/2017 137  135 - 145 mEq/L Final  . Potassium 12/05/2017 4.3  3.5 - 5.1 mEq/L Final  . Chloride 12/05/2017 103  96 - 112 mEq/L Final  . CO2 12/05/2017 25  19 - 32 mEq/L Final  . Glucose, Bld 12/05/2017 119* 70 - 99 mg/dL Final  . BUN 12/05/2017 20  6 - 23 mg/dL Final  . Creatinine, Ser 12/05/2017 1.05  0.40 - 1.50  mg/dL Final  . Total Bilirubin 12/05/2017 0.6  0.2 - 1.2 mg/dL Final  . Alkaline Phosphatase 12/05/2017 59  39 - 117 U/L Final  . AST 12/05/2017 21  0 - 37 U/L Final  . ALT 12/05/2017 27  0 - 53 U/L Final  . Total Protein 12/05/2017 7.5  6.0 - 8.3 g/dL Final  . Albumin 12/05/2017 4.3  3.5 - 5.2 g/dL Final  . Calcium 12/05/2017 9.8  8.4 - 10.5 mg/dL Final  . GFR 12/05/2017 78.23  >60.00 mL/min Final  . Hgb A1c MFr Bld 12/05/2017 7.9* 4.6 - 6.5 % Final   Glycemic Control Guidelines for People with Diabetes:Non Diabetic:  <6%Goal of Therapy: <7%Additional Action Suggested:  >8%        Allergies as of 12/08/2017      Reactions   Penicillins Anaphylaxis   Influenza Vac Split Quad    cellulitis   Pneumococcal Vaccines Other  (See Comments)   Cellulitis, swelling      Medication List        Accurate as of 12/08/17 10:57 AM. Always use your most recent med list.          allopurinol 100 MG tablet Commonly known as:  ZYLOPRIM Take 1 tablet (100 mg total) by mouth 2 (two) times daily as needed. For gout   gabapentin 300 MG capsule Commonly known as:  NEURONTIN Take 1 capsule 3 times a day as needed   Insulin Detemir 100 UNIT/ML Pen Commonly known as:  LEVEMIR FLEXTOUCH Inject 10 Units into the skin daily at 10 pm.   Insulin Pen Needle 32G X 4 MM Misc Use to inject insulin once daily   JARDIANCE 25 MG Tabs tablet Generic drug:  empagliflozin TAKE 1 TABLET BY MOUTH DAILY   metFORMIN 500 MG 24 hr tablet Commonly known as:  GLUCOPHAGE-XR TAKE 2 TABLETS (1000MG ) TWOTIMES A DAY   ONE TOUCH ULTRA TEST test strip Generic drug:  glucose blood USE AS DIRECTED TO CHECK BLOOD SUGAR TWICE DAILY   ONETOUCH DELICA LANCETS 10U Misc Use to check blood sugar 2 times per day dx code E11.65   rosuvastatin 10 MG tablet Commonly known as:  CRESTOR TAKE 1 TABLET DAILY   VICTOZA 18 MG/3ML Sopn Generic drug:  liraglutide INJECT 1.8 MG DAILY       Allergies:  Allergies  Allergen Reactions  . Penicillins Anaphylaxis  . Influenza Vac Split Quad     cellulitis  . Pneumococcal Vaccines Other (See Comments)    Cellulitis, swelling    Past Medical History:  Diagnosis Date  . Allergy   . Bowel obstruction ([place]) 10/2011  . Diabetes mellitus   . Gout   . Hx of adenomatous polyp of colon 01/22/2015  . Hyperlipidemia   . Hypertension   . Umbilical hernia     Past Surgical History:  Procedure Laterality Date  . APPENDECTOMY     1980s    Family History  Problem Relation Age of Onset  . Diabetes Mother   . Hyperlipidemia Father   . Hypertension Father   . Dementia Father   . Colon cancer Neg Hx   . Esophageal cancer Neg Hx   . Stomach cancer Neg Hx   . Rectal cancer Neg Hx     Social History:   reports that he has been smoking cigarettes and cigars.  He has been smoking about 0.25 packs per day. he has never used smokeless tobacco. He reports that he drinks alcohol. He reports that  he does not use drugs.    Review of Systems      Hyperlipidemia: He has mixed hyperlipidemia with increased LDL and persistently low HDL  He is  taking 10 mg Crestor which he is tolerating without muscle pain and LDL is controlled Previously on Lipitor which caused muscle aches    Lab Results  Component Value Date   CHOL 145 07/07/2017   HDL 28.40 (L) 07/07/2017   LDLCALC 85 07/07/2017   LDLDIRECT 118.0 10/09/2015   TRIG 158.0 (H) 07/07/2017   CHOLHDL 5 07/07/2017    Neuropathy:   He had painful burning sensation in his feet occasionally when he is sitting or standing for long, usually not taking gabapentin now   Foot exam was normal in 10/18  ?  HYPERTENSION:  Has variable blood pressure readings in the office, better now  BP Readings from Last 3 Encounters:  12/08/17 116/80  07/12/17 140/90  04/11/17 (!) 136/100     LABS:   Lab on 12/05/2017  Component Date Value Ref Range Status  . Sodium 12/05/2017 137  135 - 145 mEq/L Final  . Potassium 12/05/2017 4.3  3.5 - 5.1 mEq/L Final  . Chloride 12/05/2017 103  96 - 112 mEq/L Final  . CO2 12/05/2017 25  19 - 32 mEq/L Final  . Glucose, Bld 12/05/2017 119* 70 - 99 mg/dL Final  . BUN 12/05/2017 20  6 - 23 mg/dL Final  . Creatinine, Ser 12/05/2017 1.05  0.40 - 1.50 mg/dL Final  . Total Bilirubin 12/05/2017 0.6  0.2 - 1.2 mg/dL Final  . Alkaline Phosphatase 12/05/2017 59  39 - 117 U/L Final  . AST 12/05/2017 21  0 - 37 U/L Final  . ALT 12/05/2017 27  0 - 53 U/L Final  . Total Protein 12/05/2017 7.5  6.0 - 8.3 g/dL Final  . Albumin 12/05/2017 4.3  3.5 - 5.2 g/dL Final  . Calcium 12/05/2017 9.8  8.4 - 10.5 mg/dL Final  . GFR 12/05/2017 78.23  >60.00 mL/min Final  . Hgb A1c MFr Bld 12/05/2017 7.9* 4.6 - 6.5 % Final   Glycemic Control  Guidelines for People with Diabetes:Non Diabetic:  <6%Goal of Therapy: <7%Additional Action Suggested:  >8%     Physical Examination:  BP 116/80 (BP Location: Right Arm, Patient Position: Sitting, Cuff Size: Large)   Pulse 91   Wt 280 lb (127 kg)   SpO2 97%   BMI 40.18 kg/m     ASSESSMENT/PLAN:  Diabetes type 2, uncontrolled   See history of present illness for detailed discussion of current diabetes management, blood sugar patterns and problems identified  His blood sugars are not as well controlled and he has gained weight  As discussed above his diet and exercise regimen has been poor for various reasons but he is starting to get back into walking and trying to improve his diet and cut back on sweets However he has been out of his insulin and this is causing persistently high fasting readings  Recommendations made today:  He will switch to [place] instead of Levemir and if he starts having blood sugars below 100 he can cut back the dose steamy instead of 18  Consistent exercise and he can build up to the level of exercise and frequency  Consistent diet  More readings after dinner  Stay on Victoza and Jardiance  Follow-up in 3 months unless blood sugars do not improve    There are no Patient Instructions on file for this visit.  Elayne Snare 12/08/2017, 10:57 AM

## 2017-12-08 NOTE — Patient Instructions (Addendum)
18 [place]  Check blood sugars on waking up  4/7 days  Also check blood sugars about 2 hours after a meal and do this after different meals by rotation  Recommended blood sugar levels on waking up is 90-130 and about 2 hours after meal is 130-160  Please bring your blood sugar monitor to each visit, thank you

## 2017-12-19 ENCOUNTER — Other Ambulatory Visit: Payer: Self-pay | Admitting: Endocrinology

## 2017-12-27 ENCOUNTER — Other Ambulatory Visit: Payer: Self-pay | Admitting: Endocrinology

## 2017-12-28 ENCOUNTER — Encounter: Payer: Self-pay | Admitting: Endocrinology

## 2018-01-02 ENCOUNTER — Telehealth: Payer: Self-pay | Admitting: Endocrinology

## 2018-01-02 NOTE — Telephone Encounter (Signed)
insulin degludec (TRESIBA FLEXTOUCH) 100 UNIT/ML SOPN FlexTouch Pen     [facility] [place], [place] to [facility] [employer] [facility]   Patient called and stated they have not received anything from pharmacy. And would like to know if we can resend it.

## 2018-01-03 ENCOUNTER — Other Ambulatory Visit: Payer: Self-pay

## 2018-01-03 MED ORDER — INSULIN DEGLUDEC 100 UNIT/ML ~~LOC~~ SOPN: 20.0000 [IU] | PEN_INJECTOR | Freq: Every day | SUBCUTANEOUS | 1 refills | Status: DC

## 2018-01-03 NOTE — Telephone Encounter (Signed)
This has been done.

## 2018-01-28 ENCOUNTER — Other Ambulatory Visit: Payer: Self-pay | Admitting: Endocrinology

## 2018-03-05 ENCOUNTER — Other Ambulatory Visit: Payer: Self-pay | Admitting: Endocrinology

## 2018-03-06 ENCOUNTER — Other Ambulatory Visit: Payer: BC Managed Care – PPO

## 2018-03-08 ENCOUNTER — Other Ambulatory Visit (INDEPENDENT_AMBULATORY_CARE_PROVIDER_SITE_OTHER): Payer: BC Managed Care – PPO

## 2018-03-08 DIAGNOSIS — E1165 Type 2 diabetes mellitus with hyperglycemia: Secondary | ICD-10-CM | POA: Diagnosis not present

## 2018-03-08 DIAGNOSIS — Z794 Long term (current) use of insulin: Secondary | ICD-10-CM

## 2018-03-08 LAB — COMPREHENSIVE METABOLIC PANEL
ALBUMIN: 4.5 g/dL (ref 3.5–5.2)
ALK PHOS: 56 U/L (ref 39–117)
ALT: 25 U/L (ref 0–53)
AST: 21 U/L (ref 0–37)
BUN: 14 mg/dL (ref 6–23)
CALCIUM: 9.7 mg/dL (ref 8.4–10.5)
CO2: 24 mEq/L (ref 19–32)
Chloride: 105 mEq/L (ref 96–112)
Creatinine, Ser: 0.99 mg/dL (ref 0.40–1.50)
GFR: 83.64 mL/min (ref >60.00)
Glucose, Bld: 114 mg/dL — ABNORMAL HIGH (ref 70–99)
POTASSIUM: 4.2 meq/L (ref 3.5–5.1)
SODIUM: 137 meq/L (ref 135–145)
TOTAL PROTEIN: 7.2 g/dL (ref 6.0–8.3)
Total Bilirubin: 0.7 mg/dL (ref 0.2–1.2)

## 2018-03-08 LAB — MICROALBUMIN / CREATININE URINE RATIO
Creatinine,U: 92.5 mg/dL
Microalb Creat Ratio: 0.8 mg/g (ref 0.0–30.0)

## 2018-03-08 LAB — HEMOGLOBIN A1C: HEMOGLOBIN A1C: 7.5 % — AB (ref 4.6–6.5)

## 2018-03-10 ENCOUNTER — Ambulatory Visit: Payer: BC Managed Care – PPO | Admitting: Endocrinology

## 2018-03-14 ENCOUNTER — Ambulatory Visit: Payer: BC Managed Care – PPO | Admitting: Endocrinology

## 2018-03-14 ENCOUNTER — Encounter: Payer: Self-pay | Admitting: Endocrinology

## 2018-03-14 VITALS — BP 130/84 | HR 93 | Wt 279.0 lb

## 2018-03-14 DIAGNOSIS — E1165 Type 2 diabetes mellitus with hyperglycemia: Secondary | ICD-10-CM | POA: Diagnosis not present

## 2018-03-14 DIAGNOSIS — Z794 Long term (current) use of insulin: Secondary | ICD-10-CM

## 2018-03-14 NOTE — Patient Instructions (Signed)
More sugars AFTER MEALS

## 2018-03-14 NOTE — Progress Notes (Signed)
Patient ID: Greg Kane, male   DOB: 05-Apr-1966, 52 y.o.   MRN: [id number]   Reason for Appointment :  Follow up of Type 2 Diabetes  History of Present Illness          Diagnosis: Type 2 diabetes mellitus, date of diagnosis 2008  Background information: Initial diagnosis was made incidentally and had only mild hyperglycemia. Initially was treated with metformin alone and then Actos was added He has not had any formal diabetes education and has had difficulty losing weight His blood sugars have been only fairly well controlled with A1c usually in the 7-8 range Because of inadequate control in 2013 he was given Victoza injection in addition. However Actos was stopped  He thinks this helped bring his blood sugars better controlled but helped his portion control only slightly He did not lose much weight with Victoza In 6/14 he was given Invokana in addition and his Victoza was resumed.  RECENT history    Treatment regimen: Insulin:  Tresiba 20 units at night, non-insulin drugs: Victoza 1.8 mg, Jardiance 25 mg, metformin 2000 mg  A1c is relatively better at 7.5, previously 7.9   Recent blood sugar patterns and problems identified:  He has taken [place] since his visit in March when he was taking Levemir  He takes both [place] and [place] in the morning and has gone up 2 units on the [place] since his last visit  Previously his blood sugars have not been well controlled because of various issues and difficulty with consistent diet and exercise also  Although he thinks his blood sugars are much better with [place] and he feels better his A1c is not improved much  However he ran out of his insulin for 3 weeks in April but recently has been regular with this  He thinks he checks his sugars after meals also at least 3 times a week and these are only rarely over 200 if he goes off his diet  Although he is fairly active with his work he is not doing much formal exercise  except maybe once a week  Weight is about the same  Glucose monitoring: Using  Touch ultra meter less than once a day.     Blood Glucose readings from recall:  Fasting readings 103-135 Postprandial readings probably averaging 160  Physical activity: walking/playing golf/other exercises at the gym for up to 45 minutes, 1-2/7Days   Wt Readings from Last 3 Encounters:  03/14/18 279 lb (126.6 kg)  12/08/17 280 lb (127 kg)  07/12/17 275 lb (124.7 kg)      Lab Results  Component Value Date   HGBA1C 7.5 (H) 03/08/2018   HGBA1C 7.9 (H) 12/05/2017   HGBA1C 7.2 (H) 07/07/2017   Lab Results  Component Value Date   MICROALBUR <0.7 03/08/2018   LDLCALC 85 07/07/2017   CREATININE 0.99 03/08/2018     Lab Results  Component Value Date   MICROALBUR <0.7 03/08/2018   Lab on 03/08/2018  Component Date Value Ref Range Status  . Microalb, Ur 03/08/2018 <0.7  0.0 - 1.9 mg/dL Final  . Creatinine,U 03/08/2018 92.5  mg/dL Final  . Microalb Creat Ratio 03/08/2018 0.8  0.0 - 30.0 mg/g Final  . Sodium 03/08/2018 137  135 - 145 mEq/L Final  . Potassium 03/08/2018 4.2  3.5 - 5.1 mEq/L Final  . Chloride 03/08/2018 105  96 - 112 mEq/L Final  . CO2 03/08/2018 24  19 - 32 mEq/L Final  . Glucose, Bld 03/08/2018  114* 70 - 99 mg/dL Final  . BUN 03/08/2018 14  6 - 23 mg/dL Final  . Creatinine, Ser 03/08/2018 0.99  0.40 - 1.50 mg/dL Final  . Total Bilirubin 03/08/2018 0.7  0.2 - 1.2 mg/dL Final  . Alkaline Phosphatase 03/08/2018 56  39 - 117 U/L Final  . AST 03/08/2018 21  0 - 37 U/L Final  . ALT 03/08/2018 25  0 - 53 U/L Final  . Total Protein 03/08/2018 7.2  6.0 - 8.3 g/dL Final  . Albumin 03/08/2018 4.5  3.5 - 5.2 g/dL Final  . Calcium 03/08/2018 9.7  8.4 - 10.5 mg/dL Final  . GFR 03/08/2018 83.64  >60.00 mL/min Final  . Hgb A1c MFr Bld 03/08/2018 7.5* 4.6 - 6.5 % Final   Glycemic Control Guidelines for People with Diabetes:Non Diabetic:  <6%Goal of Therapy: <7%Additional Action Suggested:   >8%        Allergies as of 03/14/2018      Reactions   Penicillins Anaphylaxis   Influenza Vac Split Quad    cellulitis   Pneumococcal Vaccines Other (See Comments)   Cellulitis, swelling      Medication List        Accurate as of 03/14/18  4:53 PM. Always use your most recent med list.          allopurinol 100 MG tablet Commonly known as:  ZYLOPRIM Take 1 tablet (100 mg total) by mouth 2 (two) times daily as needed. For gout   gabapentin 300 MG capsule Commonly known as:  NEURONTIN Take 1 capsule 3 times a day as needed   insulin degludec 100 UNIT/ML Sopn FlexTouch Pen Commonly known as:  TRESIBA FLEXTOUCH Inject 0.2 mLs (20 Units total) into the skin daily.   Insulin Pen Needle 32G X 4 MM Misc Use to inject insulin once daily   JARDIANCE 25 MG Tabs tablet Generic drug:  empagliflozin TAKE 1 TABLET BY MOUTH DAILY   metFORMIN 500 MG 24 hr tablet Commonly known as:  GLUCOPHAGE-XR TAKE 2 TABLETS (1000MG ) TWOTIMES A DAY   ONE TOUCH ULTRA TEST test strip Generic drug:  glucose blood USE TO CHECK BLOOD SUGAR TWICE DAILY AS DIRECTED   ONETOUCH DELICA LANCETS 79K Misc Use to check blood sugar 2 times per day dx code E11.65   rosuvastatin 10 MG tablet Commonly known as:  CRESTOR TAKE 1 TABLET DAILY   VICTOZA 18 MG/3ML Sopn Generic drug:  liraglutide INJECT 1.8 MG DAILY       Allergies:  Allergies  Allergen Reactions  . Penicillins Anaphylaxis  . Influenza Vac Split Quad     cellulitis  . Pneumococcal Vaccines Other (See Comments)    Cellulitis, swelling    Past Medical History:  Diagnosis Date  . Allergy   . Bowel obstruction ([place]) 10/2011  . Diabetes mellitus   . Gout   . Hx of adenomatous polyp of colon 01/22/2015  . Hyperlipidemia   . Hypertension   . Umbilical hernia     Past Surgical History:  Procedure Laterality Date  . APPENDECTOMY     1980s    Family History  Problem Relation Age of Onset  . Diabetes Mother   . Hyperlipidemia  Father   . Hypertension Father   . Dementia Father   . Colon cancer Neg Hx   . Esophageal cancer Neg Hx   . Stomach cancer Neg Hx   . Rectal cancer Neg Hx     Social History:  reports that he has  been smoking cigarettes and cigars.  He has been smoking about 0.25 packs per day. He has never used smokeless tobacco. He reports that he drinks alcohol. He reports that he does not use drugs.    Review of Systems      Hyperlipidemia: He has mixed hyperlipidemia with increased LDL and persistently low HDL  He is  taking 10 mg Crestor which he is tolerating without muscle pain and LDL is controlled Previously on Lipitor which caused muscle aches   Lab Results  Component Value Date   CHOL 145 07/07/2017   HDL 28.40 (L) 07/07/2017   LDLCALC 85 07/07/2017   LDLDIRECT 118.0 10/09/2015   TRIG 158.0 (H) 07/07/2017   CHOLHDL 5 07/07/2017    Neuropathy:   He has had painful burning sensation in his feet occasionally when he is sitting or standing for long, usually not taking gabapentin now   Foot exam was normal in 10/18  ?  HYPERTENSION:  Has variable blood pressure readings in the office, somewhat higher if he is anxious  BP Readings from Last 3 Encounters:  03/14/18 130/84  12/08/17 116/80  07/12/17 140/90     LABS:   Lab on 03/08/2018  Component Date Value Ref Range Status  . Microalb, Ur 03/08/2018 <0.7  0.0 - 1.9 mg/dL Final  . Creatinine,U 03/08/2018 92.5  mg/dL Final  . Microalb Creat Ratio 03/08/2018 0.8  0.0 - 30.0 mg/g Final  . Sodium 03/08/2018 137  135 - 145 mEq/L Final  . Potassium 03/08/2018 4.2  3.5 - 5.1 mEq/L Final  . Chloride 03/08/2018 105  96 - 112 mEq/L Final  . CO2 03/08/2018 24  19 - 32 mEq/L Final  . Glucose, Bld 03/08/2018 114* 70 - 99 mg/dL Final  . BUN 03/08/2018 14  6 - 23 mg/dL Final  . Creatinine, Ser 03/08/2018 0.99  0.40 - 1.50 mg/dL Final  . Total Bilirubin 03/08/2018 0.7  0.2 - 1.2 mg/dL Final  . Alkaline Phosphatase 03/08/2018 56  39 -  117 U/L Final  . AST 03/08/2018 21  0 - 37 U/L Final  . ALT 03/08/2018 25  0 - 53 U/L Final  . Total Protein 03/08/2018 7.2  6.0 - 8.3 g/dL Final  . Albumin 03/08/2018 4.5  3.5 - 5.2 g/dL Final  . Calcium 03/08/2018 9.7  8.4 - 10.5 mg/dL Final  . GFR 03/08/2018 83.64  >60.00 mL/min Final  . Hgb A1c MFr Bld 03/08/2018 7.5* 4.6 - 6.5 % Final   Glycemic Control Guidelines for People with Diabetes:Non Diabetic:  <6%Goal of Therapy: <7%Additional Action Suggested:  >8%     Physical Examination:  BP 130/84   Pulse 93   Wt 279 lb (126.6 kg)   SpO2 98%   BMI 40.03 kg/m     ASSESSMENT/PLAN:  Diabetes type 2, BMI 40   See history of present illness for detailed discussion of current diabetes management, blood sugar patterns and problems identified He is on a regimen of basal insulin, Jardiance and Victoza  Although his sugars are reportedly excellent at home he still has an A1c of 7.5 He may have occasional high readings after meals but also A1c may be influenced by his not taking insulin for 3 weeks in April Again has difficulty losing weight He thinks he is trying to be relatively active but can do better when he is not as busy at work  He will continue his regimen unchanged Since Ozempic is not covered will wait till next year  to try this More readings after meals Consistent exercise To bring monitor on each visit for download Lipids to be followed up  Follow-up in 3 months again    Patient Instructions  More sugars AFTER MEALS    Elayne Snare 03/14/2018, 4:53 PM

## 2018-03-27 ENCOUNTER — Other Ambulatory Visit: Payer: Self-pay | Admitting: Endocrinology

## 2018-05-25 ENCOUNTER — Telehealth: Payer: Self-pay | Admitting: Endocrinology

## 2018-05-25 ENCOUNTER — Other Ambulatory Visit: Payer: Self-pay

## 2018-05-25 MED ORDER — INSULIN DEGLUDEC 100 UNIT/ML ~~LOC~~ SOPN: 20.0000 [IU] | PEN_INJECTOR | Freq: Every day | SUBCUTANEOUS | 1 refills | Status: DC

## 2018-05-25 MED ORDER — INSULIN PEN NEEDLE 32G X 4 MM MISC: 1 refills | Status: AC

## 2018-05-25 MED ORDER — LIRAGLUTIDE 18 MG/3ML ~~LOC~~ SOPN: PEN_INJECTOR | SUBCUTANEOUS | 1 refills | Status: DC

## 2018-05-25 NOTE — Telephone Encounter (Signed)
Done

## 2018-05-25 NOTE — Telephone Encounter (Signed)
Per [facility]-Caller states takes Victoza,has been trying to get a prescription renew for his needles in which is completely out and [place]. RN Assessment: Caller states that he is needing a refill on his pen needles for his insulin pens. He denies new symptoms. Blood glucose is 184 today (05/24/18)

## 2018-06-03 ENCOUNTER — Other Ambulatory Visit: Payer: Self-pay | Admitting: Endocrinology

## 2018-06-12 ENCOUNTER — Other Ambulatory Visit: Payer: BC Managed Care – PPO

## 2018-06-16 ENCOUNTER — Ambulatory Visit: Payer: BC Managed Care – PPO | Admitting: Endocrinology

## 2018-06-17 ENCOUNTER — Other Ambulatory Visit: Payer: Self-pay | Admitting: Endocrinology

## 2018-07-06 ENCOUNTER — Other Ambulatory Visit: Payer: Self-pay | Admitting: Endocrinology

## 2018-09-04 ENCOUNTER — Other Ambulatory Visit (INDEPENDENT_AMBULATORY_CARE_PROVIDER_SITE_OTHER): Payer: BC Managed Care – PPO

## 2018-09-04 DIAGNOSIS — E1165 Type 2 diabetes mellitus with hyperglycemia: Secondary | ICD-10-CM

## 2018-09-04 DIAGNOSIS — Z794 Long term (current) use of insulin: Secondary | ICD-10-CM | POA: Diagnosis not present

## 2018-09-04 LAB — COMPREHENSIVE METABOLIC PANEL
ALBUMIN: 4.6 g/dL (ref 3.5–5.2)
ALT: 26 U/L (ref 0–53)
AST: 23 U/L (ref 0–37)
Alkaline Phosphatase: 60 U/L (ref 39–117)
BILIRUBIN TOTAL: 0.5 mg/dL (ref 0.2–1.2)
BUN: 21 mg/dL (ref 6–23)
CALCIUM: 9.9 mg/dL (ref 8.4–10.5)
CO2: 23 mEq/L (ref 19–32)
CREATININE: 1 mg/dL (ref 0.40–1.50)
Chloride: 104 mEq/L (ref 96–112)
GFR: 82.53 mL/min (ref >60.00)
Glucose, Bld: 120 mg/dL — ABNORMAL HIGH (ref 70–99)
Potassium: 4.2 mEq/L (ref 3.5–5.1)
Sodium: 137 mEq/L (ref 135–145)
Total Protein: 7.4 g/dL (ref 6.0–8.3)

## 2018-09-04 LAB — LIPID PANEL
Cholesterol: 247 mg/dL — ABNORMAL HIGH (ref 0–200)
HDL: 33.8 mg/dL — AB (ref >39.00)
NonHDL: 213.68
TRIGLYCERIDES: 353 mg/dL — AB (ref 0.0–149.0)
Total CHOL/HDL Ratio: 7
VLDL: 70.6 mg/dL — ABNORMAL HIGH (ref 0.0–40.0)

## 2018-09-04 LAB — LDL CHOLESTEROL, DIRECT: LDL DIRECT: 146 mg/dL

## 2018-09-04 LAB — HEMOGLOBIN A1C: HEMOGLOBIN A1C: 6.9 % — AB (ref 4.6–6.5)

## 2018-09-06 ENCOUNTER — Encounter: Payer: Self-pay | Admitting: Endocrinology

## 2018-09-06 ENCOUNTER — Ambulatory Visit: Payer: BC Managed Care – PPO | Admitting: Endocrinology

## 2018-09-06 VITALS — BP 130/80 | HR 84 | Ht 70.0 in | Wt 278.2 lb

## 2018-09-06 DIAGNOSIS — E782 Mixed hyperlipidemia: Secondary | ICD-10-CM | POA: Diagnosis not present

## 2018-09-06 DIAGNOSIS — Z794 Long term (current) use of insulin: Secondary | ICD-10-CM

## 2018-09-06 DIAGNOSIS — E1165 Type 2 diabetes mellitus with hyperglycemia: Secondary | ICD-10-CM

## 2018-09-06 MED ORDER — ROSUVASTATIN CALCIUM 10 MG PO TABS: 10.0000 mg | ORAL_TABLET | Freq: Every day | ORAL | 1 refills | Status: DC

## 2018-09-06 NOTE — Progress Notes (Addendum)
Patient ID: Greg Kane, male   DOB: Apr 27, 1966, 53 y.o.   MRN: [id number]   Reason for Appointment :  Follow up of Type 2 Diabetes  History of Present Illness          Diagnosis: Type 2 diabetes mellitus, date of diagnosis 2008  Background information: Initial diagnosis was made incidentally and had only mild hyperglycemia. Initially was treated with metformin alone and then Actos was added He has not had any formal diabetes education and has had difficulty losing weight His blood sugars have been only fairly well controlled with A1c usually in the 7-8 range Because of inadequate control in 2013 he was given Victoza injection in addition. However Actos was stopped  He thinks this helped bring his blood sugars better controlled but helped his portion control only slightly He did not lose much weight with Victoza In 6/14 he was given Invokana in addition and his Victoza was resumed.  RECENT history    Treatment regimen: Insulin:  Tresiba 20 units at night, non-insulin drugs: Victoza 1.8 mg, Jardiance 25 mg, metformin 2000 mg  A1c is relatively better at 6.9 compared to 7.5   Recent blood sugar patterns and problems identified:  He says that he has been overall watching his diet and cutting back on carbohydrates and high-fat foods  However more recently with celebrations he has probably gained up to 10 pounds back on his weight  However he thinks his blood sugars are fairly consistent in the morning  As before he forgets to check his sugars after meals and also is checking very infrequently overall  He has tried to go to the gym for exercise about 3 days a week but may be regular in the last month  Has not changed his [place] dose  Takes his Victoza also at the same time in the morning  Glucose monitoring: Using  Touch ultra meter less than once a day.     Blood Glucose readings from download:  Fasting readings range from 118 up to 152 with average about  130 Midmorning 131 and dinnertime 117  Previous range 103-135 fasting  Physical activity: walking/playing golf/other exercises at the gym for up to 45 minutes, 3/7Days   Wt Readings from Last 3 Encounters:  09/06/18 278 lb 3.2 oz (126.2 kg)  03/14/18 279 lb (126.6 kg)  12/08/17 280 lb (127 kg)      Lab Results  Component Value Date   HGBA1C 6.9 (H) 09/04/2018   HGBA1C 7.5 (H) 03/08/2018   HGBA1C 7.9 (H) 12/05/2017   Lab Results  Component Value Date   MICROALBUR <0.7 03/08/2018   LDLCALC 85 07/07/2017   CREATININE 1.00 09/04/2018     Lab Results  Component Value Date   MICROALBUR <0.7 03/08/2018   Lab on 09/04/2018  Component Date Value Ref Range Status  . Cholesterol 09/04/2018 247* 0 - 200 mg/dL Final   ATP III Classification       Desirable:  < 200 mg/dL               Borderline High:  200 - 239 mg/dL          High:  > = 240 mg/dL  . Triglycerides 09/04/2018 353.0* 0.0 - 149.0 mg/dL Final   Normal:  <150 mg/dLBorderline High:  150 - 199 mg/dL  . HDL 09/04/2018 33.80* >39.00 mg/dL Final  . VLDL 09/04/2018 70.6* 0.0 - 40.0 mg/dL Final  . Total CHOL/HDL Ratio 09/04/2018 7   Final  Men          Women1/2 Average Risk     3.4          3.3Average Risk          5.0          4.42X Average Risk          9.6          7.13X Average Risk          15.0          11.0                      . NonHDL 09/04/2018 213.68   Final   NOTE:  Non-HDL goal should be 30 mg/dL higher than patient's LDL goal (i.e. LDL goal of < 70 mg/dL, would have non-HDL goal of < 100 mg/dL)  . Sodium 09/04/2018 137  135 - 145 mEq/L Final  . Potassium 09/04/2018 4.2  3.5 - 5.1 mEq/L Final  . Chloride 09/04/2018 104  96 - 112 mEq/L Final  . CO2 09/04/2018 23  19 - 32 mEq/L Final  . Glucose, Bld 09/04/2018 120* 70 - 99 mg/dL Final  . BUN 09/04/2018 21  6 - 23 mg/dL Final  . Creatinine, Ser 09/04/2018 1.00  0.40 - 1.50 mg/dL Final  . Total Bilirubin 09/04/2018 0.5  0.2 - 1.2 mg/dL Final  .  Alkaline Phosphatase 09/04/2018 60  39 - 117 U/L Final  . AST 09/04/2018 23  0 - 37 U/L Final  . ALT 09/04/2018 26  0 - 53 U/L Final  . Total Protein 09/04/2018 7.4  6.0 - 8.3 g/dL Final  . Albumin 09/04/2018 4.6  3.5 - 5.2 g/dL Final  . Calcium 09/04/2018 9.9  8.4 - 10.5 mg/dL Final  . GFR 09/04/2018 82.53  >60.00 mL/min Final  . Hgb A1c MFr Bld 09/04/2018 6.9* 4.6 - 6.5 % Final   Glycemic Control Guidelines for People with Diabetes:Non Diabetic:  <6%Goal of Therapy: <7%Additional Action Suggested:  >8%   . Direct LDL 09/04/2018 146.0  mg/dL Final   Optimal:  <100 mg/dLNear or Above Optimal:  100-129 mg/dLBorderline High:  130-159 mg/dLHigh:  160-189 mg/dLVery High:  >190 mg/dL       Allergies as of 09/06/2018      Reactions   Penicillins Anaphylaxis   Influenza Vac Split Quad    cellulitis   Pneumococcal Vaccines Other (See Comments)   Cellulitis, swelling      Medication List        Accurate as of 09/06/18  1:48 PM. Always use your most recent med list.          allopurinol 100 MG tablet Commonly known as:  ZYLOPRIM Take 1 tablet (100 mg total) by mouth 2 (two) times daily as needed. For gout   gabapentin 300 MG capsule Commonly known as:  NEURONTIN Take 1 capsule 3 times a day as needed   insulin degludec 100 UNIT/ML Sopn FlexTouch Pen Commonly known as:  TRESIBA Inject 0.2 mLs (20 Units total) into the skin daily.   Insulin Pen Needle 32G X 4 MM Misc Use to inject insulin once daily   JARDIANCE 25 MG Tabs tablet Generic drug:  empagliflozin TAKE 1 TABLET BY MOUTH DAILY   liraglutide 18 MG/3ML Sopn Commonly known as:  VICTOZA INJECT 1.8 MG DAILY   metFORMIN 500 MG 24 hr tablet Commonly known as:  GLUCOPHAGE-XR TAKE 2 TABLETS (1000MG ) TWOTIMES A DAY  ONE TOUCH ULTRA TEST test strip Generic drug:  glucose blood USE TO CHECK BLOOD SUGAR TWICE DAILY AS DIRECTED   ONETOUCH DELICA LANCETS 73X Misc Use to check blood sugar 2 times per day dx code E11.65     rosuvastatin 10 MG tablet Commonly known as:  CRESTOR TAKE 1 TABLET DAILY       Allergies:  Allergies  Allergen Reactions  . Penicillins Anaphylaxis  . Influenza Vac Split Quad     cellulitis  . Pneumococcal Vaccines Other (See Comments)    Cellulitis, swelling    Past Medical History:  Diagnosis Date  . Allergy   . Bowel obstruction ([place]) 10/2011  . Diabetes mellitus   . Gout   . Hx of adenomatous polyp of colon 01/22/2015  . Hyperlipidemia   . Hypertension   . Umbilical hernia     Past Surgical History:  Procedure Laterality Date  . APPENDECTOMY     1980s    Family History  Problem Relation Age of Onset  . Diabetes Mother   . Hyperlipidemia Father   . Hypertension Father   . Dementia Father   . Colon cancer Neg Hx   . Esophageal cancer Neg Hx   . Stomach cancer Neg Hx   . Rectal cancer Neg Hx     Social History:  reports that he has been smoking cigarettes and cigars. He has been smoking about 0.25 packs per day. He has never used smokeless tobacco. He reports that he drinks alcohol. He reports that he does not use drugs.    Review of Systems      Hyperlipidemia: He has mixed hyperlipidemia with increased LDL and persistently low HDL  He was taking 10 mg Crestor which he was tolerating without muscle pain and LDL had been controlled Previously on Lipitor which caused muscle aches  He says he has not taken this for 6 months and apparently thought that since his cholesterol was normal he does not need to take it  Lab Results  Component Value Date   CHOL 247 (H) 09/04/2018   HDL 33.80 (L) 09/04/2018   LDLCALC 85 07/07/2017   LDLDIRECT 146.0 09/04/2018   TRIG 353.0 (H) 09/04/2018   CHOLHDL 7 09/04/2018    Neuropathy:   He has had painful burning sensation in his feet occasionally; not taking gabapentin   Foot exam was normal in 09/2018  Blood pressure: Has variable blood pressure readings in the office, somewhat higher if he is anxious  BP  Readings from Last 3 Encounters:  09/06/18 130/80  03/14/18 130/84  12/08/17 116/80     LABS:   Lab on 09/04/2018  Component Date Value Ref Range Status  . Cholesterol 09/04/2018 247* 0 - 200 mg/dL Final   ATP III Classification       Desirable:  < 200 mg/dL               Borderline High:  200 - 239 mg/dL          High:  > = 240 mg/dL  . Triglycerides 09/04/2018 353.0* 0.0 - 149.0 mg/dL Final   Normal:  <150 mg/dLBorderline High:  150 - 199 mg/dL  . HDL 09/04/2018 33.80* >39.00 mg/dL Final  . VLDL 09/04/2018 70.6* 0.0 - 40.0 mg/dL Final  . Total CHOL/HDL Ratio 09/04/2018 7   Final                  Men          Women1/2  Average Risk     3.4          3.3Average Risk          5.0          4.42X Average Risk          9.6          7.13X Average Risk          15.0          11.0                      . NonHDL 09/04/2018 213.68   Final   NOTE:  Non-HDL goal should be 30 mg/dL higher than patient's LDL goal (i.e. LDL goal of < 70 mg/dL, would have non-HDL goal of < 100 mg/dL)  . Sodium 09/04/2018 137  135 - 145 mEq/L Final  . Potassium 09/04/2018 4.2  3.5 - 5.1 mEq/L Final  . Chloride 09/04/2018 104  96 - 112 mEq/L Final  . CO2 09/04/2018 23  19 - 32 mEq/L Final  . Glucose, Bld 09/04/2018 120* 70 - 99 mg/dL Final  . BUN 09/04/2018 21  6 - 23 mg/dL Final  . Creatinine, Ser 09/04/2018 1.00  0.40 - 1.50 mg/dL Final  . Total Bilirubin 09/04/2018 0.5  0.2 - 1.2 mg/dL Final  . Alkaline Phosphatase 09/04/2018 60  39 - 117 U/L Final  . AST 09/04/2018 23  0 - 37 U/L Final  . ALT 09/04/2018 26  0 - 53 U/L Final  . Total Protein 09/04/2018 7.4  6.0 - 8.3 g/dL Final  . Albumin 09/04/2018 4.6  3.5 - 5.2 g/dL Final  . Calcium 09/04/2018 9.9  8.4 - 10.5 mg/dL Final  . GFR 09/04/2018 82.53  >60.00 mL/min Final  . Hgb A1c MFr Bld 09/04/2018 6.9* 4.6 - 6.5 % Final   Glycemic Control Guidelines for People with Diabetes:Non Diabetic:  <6%Goal of Therapy: <7%Additional Action Suggested:  >8%   . Direct LDL  09/04/2018 146.0  mg/dL Final   Optimal:  <100 mg/dLNear or Above Optimal:  100-129 mg/dLBorderline High:  130-159 mg/dLHigh:  160-189 mg/dLVery High:  >190 mg/dL    Physical Examination:  BP 130/80 (BP Location: Left Arm, Patient Position: Sitting, Cuff Size: Normal)   Pulse 84   Ht 5\' 10"  (1.778 m)   Wt 278 lb 3.2 oz (126.2 kg)   SpO2 97%   BMI 39.92 kg/m     ASSESSMENT/PLAN:  Diabetes type 2, BMI 40   See history of present illness for detailed discussion of current diabetes management, blood sugar patterns and problems identified  He is on a regimen of basal insulin, Jardiance and Victoza 1.8 mg  His A1c is now finally below 7  This is generally because of his trying to be more consistent with diet and exercise since his last visit 6 months ago Although recently has gained back some weight and has blood sugars as high as 152 in the morning he appears to be doing better overall  He is asking about weekly GLP-1 injections Discussed that he can now use Rybelsus orally and this would eliminate the need to take Victoza He will start with 3 mg for 10 days and then go to 2 tablets daily to finish the 30-day supply and then go to 7 mg daily before breakfast  Discussed how this works and he will call for prescription when he is out of his.  Also explained to him how he  needs to obtain the co-pay card for this since insurance will likely deny this  LIPIDS: Emphasized the need to be on long-term Crestor for multiple benefits and the fact that his lipids are much worse with his not taking it He will resume this now Lipids to be followed up on the next visit  Follow-up in 4 months  Recommended flu vaccine but he does not want to do this    There are no Patient Instructions on file for this visit.   Elayne Snare 09/06/2018, 1:48 PM

## 2018-09-06 NOTE — Patient Instructions (Signed)
Check blood sugars on waking up 4  days a week  Also check blood sugars about 2 hours after meals and do this after different meals by rotation  Recommended blood sugar levels on waking up are 90-130 and about 2 hours after meal is 130-160  Please bring your blood sugar monitor to each visit, thank you  

## 2018-09-06 NOTE — Addendum Note (Signed)
Addended by: Elayne Snare on: 09/06/2018 03:38 PM   Modules accepted: Orders

## 2018-10-14 ENCOUNTER — Other Ambulatory Visit: Payer: Self-pay | Admitting: Endocrinology

## 2018-11-22 ENCOUNTER — Other Ambulatory Visit: Payer: Self-pay | Admitting: Endocrinology

## 2018-12-29 ENCOUNTER — Other Ambulatory Visit: Payer: Self-pay | Admitting: Endocrinology

## 2019-01-01 ENCOUNTER — Other Ambulatory Visit: Payer: BC Managed Care – PPO

## 2019-01-05 ENCOUNTER — Ambulatory Visit: Payer: BC Managed Care – PPO | Admitting: Endocrinology

## 2019-01-29 ENCOUNTER — Other Ambulatory Visit (INDEPENDENT_AMBULATORY_CARE_PROVIDER_SITE_OTHER): Payer: BC Managed Care – PPO

## 2019-01-29 ENCOUNTER — Other Ambulatory Visit: Payer: Self-pay

## 2019-01-29 DIAGNOSIS — E1165 Type 2 diabetes mellitus with hyperglycemia: Secondary | ICD-10-CM

## 2019-01-29 DIAGNOSIS — Z794 Long term (current) use of insulin: Secondary | ICD-10-CM | POA: Diagnosis not present

## 2019-01-29 DIAGNOSIS — E782 Mixed hyperlipidemia: Secondary | ICD-10-CM | POA: Diagnosis not present

## 2019-01-29 LAB — COMPREHENSIVE METABOLIC PANEL
ALT: 23 U/L (ref 0–53)
AST: 20 U/L (ref 0–37)
Albumin: 4.5 g/dL (ref 3.5–5.2)
Alkaline Phosphatase: 57 U/L (ref 39–117)
BUN: 17 mg/dL (ref 6–23)
CO2: 23 mEq/L (ref 19–32)
Calcium: 9.3 mg/dL (ref 8.4–10.5)
Chloride: 104 mEq/L (ref 96–112)
Creatinine, Ser: 1.04 mg/dL (ref 0.40–1.50)
GFR: 74.1 mL/min (ref >60.00)
Glucose, Bld: 98 mg/dL (ref 70–99)
Potassium: 3.9 mEq/L (ref 3.5–5.1)
Sodium: 138 mEq/L (ref 135–145)
Total Bilirubin: 0.5 mg/dL (ref 0.2–1.2)
Total Protein: 7.2 g/dL (ref 6.0–8.3)

## 2019-01-29 LAB — LIPID PANEL
Cholesterol: 168 mg/dL (ref 0–200)
HDL: 28.1 mg/dL — ABNORMAL LOW (ref >39.00)
LDL Cholesterol: 100 mg/dL — ABNORMAL HIGH (ref 0–99)
NonHDL: 139.53
Total CHOL/HDL Ratio: 6
Triglycerides: 200 mg/dL — ABNORMAL HIGH (ref 0.0–149.0)
VLDL: 40 mg/dL (ref 0.0–40.0)

## 2019-01-29 LAB — HEMOGLOBIN A1C: Hgb A1c MFr Bld: 7.4 % — ABNORMAL HIGH (ref 4.6–6.5)

## 2019-02-05 ENCOUNTER — Other Ambulatory Visit: Payer: Self-pay

## 2019-02-05 ENCOUNTER — Encounter: Payer: Self-pay | Admitting: Endocrinology

## 2019-02-05 ENCOUNTER — Ambulatory Visit (INDEPENDENT_AMBULATORY_CARE_PROVIDER_SITE_OTHER): Payer: BC Managed Care – PPO | Admitting: Endocrinology

## 2019-02-05 DIAGNOSIS — Z794 Long term (current) use of insulin: Secondary | ICD-10-CM

## 2019-02-05 DIAGNOSIS — E1165 Type 2 diabetes mellitus with hyperglycemia: Secondary | ICD-10-CM | POA: Diagnosis not present

## 2019-02-05 NOTE — Progress Notes (Signed)
Patient ID: Greg Kane, male   DOB: May 02, 1966, 53 y.o.   MRN: [id number]   Today's office visit was provided via telemedicine using video technique Explained to the patient and the the limitations of evaluation and management by telemedicine and the availability of in person appointments.  The patient understood the limitations and agreed to proceed. Patient also understood that the telehealth visit is billable. . Location of the patient: Home . Location of the provider: Office Only the patient and myself were participating in the encounter    Reason for Appointment :  Follow up of Type 2 Diabetes  History of Present Illness          Diagnosis: Type 2 diabetes mellitus, date of diagnosis 2008  Background information: Initial diagnosis was made incidentally and had only mild hyperglycemia. Initially was treated with metformin alone and then Actos was added He has not had any formal diabetes education and has had difficulty losing weight His blood sugars have been only fairly well controlled with A1c usually in the 7-8 range Because of inadequate control in 2013 he was given Victoza injection in addition. However Actos was stopped  He thinks this helped bring his blood sugars better controlled but helped his portion control only slightly He did not lose much weight with Victoza In 6/14 he was given Invokana in addition and his Victoza was resumed.  RECENT history    Treatment regimen: Insulin:  Tresiba 20 units in the mornings, non-insulin drugs: Victoza 1.8 mg, Jardiance 25 mg, metformin 2000 mg  A1c is relatively higher at 7.4 compared to 6.9   Recent blood sugar patterns and problems identified:  He believes he has lost about 8 pounds over the last couple of months  This is from eating at home more and eating healthier  Also is busy and active at work although doing more formal walking only about once a week  He was asked to try Rybelsus instead of Victoza  as a convenience factor but he still has not finished his supply of Victoza and does not mind doing the injection daily with his Tyler Aas  He is not clear why he is having a higher A1c even though his blood sugars have been fairly consistently good in the last month or so  He did have sinus infection in February but did not get any steroids  As before he forgets to check his readings after meals  Glucose monitoring: Using  Touch ultra meter less than once a day.     Blood Glucose readings from patient reporting  AVERAGE for 30 days = 135 with today's reading 154 Lab glucose 98 at 11 AM    Wt Readings from Last 3 Encounters:  09/06/18 278 lb 3.2 oz (126.2 kg)  03/14/18 279 lb (126.6 kg)  12/08/17 280 lb (127 kg)      Lab Results  Component Value Date   HGBA1C 7.4 (H) 01/29/2019   HGBA1C 6.9 (H) 09/04/2018   HGBA1C 7.5 (H) 03/08/2018   Lab Results  Component Value Date   MICROALBUR <0.7 03/08/2018   LDLCALC 100 (H) 01/29/2019   CREATININE 1.04 01/29/2019     Lab Results  Component Value Date   MICROALBUR <0.7 03/08/2018   No visits with results within 1 Week(s) from this visit.  Latest known visit with results is:  Lab on 01/29/2019  Component Date Value Ref Range Status  . Cholesterol 01/29/2019 168  0 - 200 mg/dL Final   ATP III  Classification       Desirable:  < 200 mg/dL               Borderline High:  200 - 239 mg/dL          High:  > = 240 mg/dL  . Triglycerides 01/29/2019 200.0* 0.0 - 149.0 mg/dL Final   Normal:  <150 mg/dLBorderline High:  150 - 199 mg/dL  . HDL 01/29/2019 28.10* >39.00 mg/dL Final  . VLDL 01/29/2019 40.0  0.0 - 40.0 mg/dL Final  . LDL Cholesterol 01/29/2019 100* 0 - 99 mg/dL Final  . Total CHOL/HDL Ratio 01/29/2019 6   Final                  Men          Women1/2 Average Risk     3.4          3.3Average Risk          5.0          4.42X Average Risk          9.6          7.13X Average Risk          15.0          11.0                      .  NonHDL 01/29/2019 139.53   Final   NOTE:  Non-HDL goal should be 30 mg/dL higher than patient's LDL goal (i.e. LDL goal of < 70 mg/dL, would have non-HDL goal of < 100 mg/dL)  . Sodium 01/29/2019 138  135 - 145 mEq/L Final  . Potassium 01/29/2019 3.9  3.5 - 5.1 mEq/L Final  . Chloride 01/29/2019 104  96 - 112 mEq/L Final  . CO2 01/29/2019 23  19 - 32 mEq/L Final  . Glucose, Bld 01/29/2019 98  70 - 99 mg/dL Final  . BUN 01/29/2019 17  6 - 23 mg/dL Final  . Creatinine, Ser 01/29/2019 1.04  0.40 - 1.50 mg/dL Final  . Total Bilirubin 01/29/2019 0.5  0.2 - 1.2 mg/dL Final  . Alkaline Phosphatase 01/29/2019 57  39 - 117 U/L Final  . AST 01/29/2019 20  0 - 37 U/L Final  . ALT 01/29/2019 23  0 - 53 U/L Final  . Total Protein 01/29/2019 7.2  6.0 - 8.3 g/dL Final  . Albumin 01/29/2019 4.5  3.5 - 5.2 g/dL Final  . Calcium 01/29/2019 9.3  8.4 - 10.5 mg/dL Final  . GFR 01/29/2019 74.10  >60.00 mL/min Final  . Hgb A1c MFr Bld 01/29/2019 7.4* 4.6 - 6.5 % Final   Glycemic Control Guidelines for People with Diabetes:Non Diabetic:  <6%Goal of Therapy: <7%Additional Action Suggested:  >8%        Allergies as of 02/05/2019      Reactions   Penicillins Anaphylaxis   Influenza Vac Split Quad    cellulitis   Pneumococcal Vaccines Other (See Comments)   Cellulitis, swelling      Medication List       Accurate as of Feb 05, 2019  2:51 PM. Always use your most recent med list.        allopurinol 100 MG tablet Commonly known as:  ZYLOPRIM Take 1 tablet (100 mg total) by mouth 2 (two) times daily as needed. For gout   gabapentin 300 MG capsule Commonly known as:  NEURONTIN Take 1 capsule 3 times a day  as needed   Insulin Pen Needle 32G X 4 MM Misc Use to inject insulin once daily   Jardiance 25 MG Tabs tablet Generic drug:  empagliflozin TAKE 1 TABLET BY MOUTH DAILY   liraglutide 18 MG/3ML Sopn Commonly known as:  Victoza INJECT 1.8 MG DAILY   metFORMIN 500 MG 24 hr tablet Commonly known  as:  GLUCOPHAGE-XR TAKE 2 TABLETS (1000MG ) TWOTIMES A DAY   ONE TOUCH ULTRA TEST test strip Generic drug:  glucose blood USE TO CHECK BLOOD SUGAR TWICE DAILY AS DIRECTED   OneTouch Delica Lancets 40J Misc Use to check blood sugar 2 times per day dx code E11.65   rosuvastatin 10 MG tablet Commonly known as:  CRESTOR Take 1 tablet (10 mg total) by mouth daily.   Tyler Aas FlexTouch 100 UNIT/ML Sopn FlexTouch Pen Generic drug:  insulin degludec INJECT 0.2ML (20 UNITS     TOTAL) SUBCUTANEOUSLY DAILY       Allergies:  Allergies  Allergen Reactions  . Penicillins Anaphylaxis  . Influenza Vac Split Quad     cellulitis  . Pneumococcal Vaccines Other (See Comments)    Cellulitis, swelling    Past Medical History:  Diagnosis Date  . Allergy   . Bowel obstruction ([place]) 10/2011  . Diabetes mellitus   . Gout   . Hx of adenomatous polyp of colon 01/22/2015  . Hyperlipidemia   . Hypertension   . Umbilical hernia     Past Surgical History:  Procedure Laterality Date  . APPENDECTOMY     1980s    Family History  Problem Relation Age of Onset  . Diabetes Mother   . Hyperlipidemia Father   . Hypertension Father   . Dementia Father   . Colon cancer Neg Hx   . Esophageal cancer Neg Hx   . Stomach cancer Neg Hx   . Rectal cancer Neg Hx     Social History:  reports that he has been smoking cigarettes and cigars. He has been smoking about 0.25 packs per day. He has never used smokeless tobacco. He reports current alcohol use. He reports that he does not use drugs.    Review of Systems      Hyperlipidemia: He has mixed hyperlipidemia with increased LDL and persistently low HDL  He is back to taking 10 mg Crestor which he is tolerating without muscle pain and LDL had been controlled Previously on Lipitor which caused muscle aches  Lipids are improved although HDL still low  Lab Results  Component Value Date   CHOL 168 01/29/2019   CHOL 247 (H) 09/04/2018   CHOL 145  07/07/2017   Lab Results  Component Value Date   HDL 28.10 (L) 01/29/2019   HDL 33.80 (L) 09/04/2018   HDL 28.40 (L) 07/07/2017   Lab Results  Component Value Date   LDLCALC 100 (H) 01/29/2019   LDLCALC 85 07/07/2017   LDLCALC 90 08/25/2016   Lab Results  Component Value Date   TRIG 200.0 (H) 01/29/2019   TRIG 353.0 (H) 09/04/2018   TRIG 158.0 (H) 07/07/2017   Lab Results  Component Value Date   CHOLHDL 6 01/29/2019   CHOLHDL 7 09/04/2018   CHOLHDL 5 07/07/2017   Lab Results  Component Value Date   LDLDIRECT 146.0 09/04/2018   LDLDIRECT 118.0 10/09/2015   LDLDIRECT 183.0 04/03/2013     Neuropathy:   He has had painful burning sensation in his feet occasionally; not needing gabapentin   Foot exam was normal in 09/2018  Blood pressure: Has variable blood pressure readings in the office, somewhat higher if he is anxious No recent readings at home  BP Readings from Last 3 Encounters:  09/06/18 130/80  03/14/18 130/84  12/08/17 116/80     LABS:   No visits with results within 1 Week(s) from this visit.  Latest known visit with results is:  Lab on 01/29/2019  Component Date Value Ref Range Status  . Cholesterol 01/29/2019 168  0 - 200 mg/dL Final   ATP III Classification       Desirable:  < 200 mg/dL               Borderline High:  200 - 239 mg/dL          High:  > = 240 mg/dL  . Triglycerides 01/29/2019 200.0* 0.0 - 149.0 mg/dL Final   Normal:  <150 mg/dLBorderline High:  150 - 199 mg/dL  . HDL 01/29/2019 28.10* >39.00 mg/dL Final  . VLDL 01/29/2019 40.0  0.0 - 40.0 mg/dL Final  . LDL Cholesterol 01/29/2019 100* 0 - 99 mg/dL Final  . Total CHOL/HDL Ratio 01/29/2019 6   Final                  Men          Women1/2 Average Risk     3.4          3.3Average Risk          5.0          4.42X Average Risk          9.6          7.13X Average Risk          15.0          11.0                      . NonHDL 01/29/2019 139.53   Final   NOTE:  Non-HDL goal should be 30  mg/dL higher than patient's LDL goal (i.e. LDL goal of < 70 mg/dL, would have non-HDL goal of < 100 mg/dL)  . Sodium 01/29/2019 138  135 - 145 mEq/L Final  . Potassium 01/29/2019 3.9  3.5 - 5.1 mEq/L Final  . Chloride 01/29/2019 104  96 - 112 mEq/L Final  . CO2 01/29/2019 23  19 - 32 mEq/L Final  . Glucose, Bld 01/29/2019 98  70 - 99 mg/dL Final  . BUN 01/29/2019 17  6 - 23 mg/dL Final  . Creatinine, Ser 01/29/2019 1.04  0.40 - 1.50 mg/dL Final  . Total Bilirubin 01/29/2019 0.5  0.2 - 1.2 mg/dL Final  . Alkaline Phosphatase 01/29/2019 57  39 - 117 U/L Final  . AST 01/29/2019 20  0 - 37 U/L Final  . ALT 01/29/2019 23  0 - 53 U/L Final  . Total Protein 01/29/2019 7.2  6.0 - 8.3 g/dL Final  . Albumin 01/29/2019 4.5  3.5 - 5.2 g/dL Final  . Calcium 01/29/2019 9.3  8.4 - 10.5 mg/dL Final  . GFR 01/29/2019 74.10  >60.00 mL/min Final  . Hgb A1c MFr Bld 01/29/2019 7.4* 4.6 - 6.5 % Final   Glycemic Control Guidelines for People with Diabetes:Non Diabetic:  <6%Goal of Therapy: <7%Additional Action Suggested:  >8%     Physical Examination:  There were no vitals taken for this visit.    ASSESSMENT/PLAN:  Diabetes type 2 with obesity   See history of present illness for detailed  discussion of current diabetes management, blood sugar patterns and problems identified  He is on a regimen of basal insulin, Jardiance and Victoza 1.8 mg  His A1c is 7.4 although had been down below 7 on the last visit  Although he has lost some weight recently not clear why his A1c is higher He is checking readings only in the morning and may be getting higher readings after meals Overall recently doing well on diet with better meal planning However can do better with exercise regimen Now finally below 7 Although he was previously interested in switching to Rybelsus instead of Victoza he is not comfortable continuing the Victoza which he takes at the same time as [place] Since he does not have any consistently  high readings in the mornings will not change his insulin He will let us know if he has consistently high readings after meals and discussed possibility of mealtime insulin in the future  LIPIDS: Improved and he will need to stay with Crestor long-term  Will review control in 3 months    There are no Patient Instructions on file for this visit.   Elayne Snare 02/05/2019, 2:51 PM

## 2019-02-20 ENCOUNTER — Other Ambulatory Visit: Payer: Self-pay | Admitting: Endocrinology

## 2019-02-27 ENCOUNTER — Other Ambulatory Visit: Payer: Self-pay | Admitting: Endocrinology

## 2019-03-27 ENCOUNTER — Other Ambulatory Visit: Payer: Self-pay

## 2019-03-27 MED ORDER — METFORMIN HCL ER 500 MG PO TB24: 500.0000 mg | ORAL_TABLET | Freq: Two times a day (BID) | ORAL | 1 refills | Status: DC

## 2019-06-05 ENCOUNTER — Other Ambulatory Visit: Payer: Self-pay

## 2019-06-05 ENCOUNTER — Other Ambulatory Visit (INDEPENDENT_AMBULATORY_CARE_PROVIDER_SITE_OTHER): Payer: BC Managed Care – PPO

## 2019-06-05 DIAGNOSIS — Z794 Long term (current) use of insulin: Secondary | ICD-10-CM

## 2019-06-05 DIAGNOSIS — E1165 Type 2 diabetes mellitus with hyperglycemia: Secondary | ICD-10-CM | POA: Diagnosis not present

## 2019-06-05 LAB — BASIC METABOLIC PANEL
BUN: 19 mg/dL (ref 6–23)
CO2: 25 mEq/L (ref 19–32)
Calcium: 9.4 mg/dL (ref 8.4–10.5)
Chloride: 105 mEq/L (ref 96–112)
Creatinine, Ser: 1.04 mg/dL (ref 0.40–1.50)
GFR: 74.01 mL/min (ref >60.00)
Glucose, Bld: 187 mg/dL — ABNORMAL HIGH (ref 70–99)
Potassium: 4 mEq/L (ref 3.5–5.1)
Sodium: 138 mEq/L (ref 135–145)

## 2019-06-05 LAB — MICROALBUMIN / CREATININE URINE RATIO
Creatinine,U: 55.3 mg/dL
Microalb Creat Ratio: 1.3 mg/g (ref 0.0–30.0)
Microalb, Ur: <0.7 mg/dL (ref 0.0–1.9)

## 2019-06-05 LAB — HEMOGLOBIN A1C: Hgb A1c MFr Bld: 7.4 % — ABNORMAL HIGH (ref 4.6–6.5)

## 2019-06-08 ENCOUNTER — Encounter: Payer: Self-pay | Admitting: Endocrinology

## 2019-06-08 ENCOUNTER — Ambulatory Visit (INDEPENDENT_AMBULATORY_CARE_PROVIDER_SITE_OTHER): Payer: BC Managed Care – PPO | Admitting: Endocrinology

## 2019-06-08 ENCOUNTER — Other Ambulatory Visit: Payer: Self-pay | Admitting: Endocrinology

## 2019-06-08 ENCOUNTER — Other Ambulatory Visit: Payer: Self-pay

## 2019-06-08 DIAGNOSIS — Z794 Long term (current) use of insulin: Secondary | ICD-10-CM

## 2019-06-08 DIAGNOSIS — E1165 Type 2 diabetes mellitus with hyperglycemia: Secondary | ICD-10-CM

## 2019-06-08 DIAGNOSIS — E782 Mixed hyperlipidemia: Secondary | ICD-10-CM | POA: Diagnosis not present

## 2019-06-08 MED ORDER — OZEMPIC (0.25 OR 0.5 MG/DOSE) 2 MG/1.5ML ~~LOC~~ SOPN: 0.5000 mg | PEN_INJECTOR | SUBCUTANEOUS | 2 refills | Status: DC

## 2019-06-08 NOTE — Progress Notes (Signed)
Patient ID: Greg Kane, male   DOB: [birth year], 53 y.o.   MRN: [id number]   Today's office visit was provided via telemedicine using video technique Explained to the patient and the the limitations of evaluation and management by telemedicine and the availability of in person appointments.  The patient understood the limitations and agreed to proceed. Patient also understood that the telehealth visit is billable. . Location of the patient: Home . Location of the provider: Office Only the patient and myself were participating in the encounter    Reason for Appointment :  Follow up of Type 2 Diabetes  History of Present Illness          Diagnosis: Type 2 diabetes mellitus, date of diagnosis 2008  Background information: Initial diagnosis was made incidentally and had only mild hyperglycemia. Initially was treated with metformin alone and then Actos was added He has not had any formal diabetes education and has had difficulty losing weight His blood sugars have been only fairly well controlled with A1c usually in the 7-8 range Because of inadequate control in 2013 he was given Victoza injection in addition. However Actos was stopped  He thinks this helped bring his blood sugars better controlled but helped his portion control only slightly He did not lose much weight with Victoza In 6/14 he was given Invokana in addition and his Victoza was resumed.  RECENT history    Treatment regimen: Insulin:  Tresiba 20 units in the mornings, non-insulin drugs: Victoza 1.8 mg, Jardiance 25 mg, metformin 2000 mg  A1c is still somewhat high at 7.4 compared to 6.9 in the past   Recent blood sugar patterns and problems identified:  He again thinks that he is generally trying to watch his diet but this has been more consistently in the last month or so only  This year he has lost about 6 pounds with some fluctuation but not clear if he has lost any weight in the last 3 months   Although he thinks that his blood sugars are not high after meals cannot explain why his A1c is high since his 30-day average blood sugar is 150  In the last 3 to 4 weeks he has been trying to walk more regularly also; prior to that he was having long working hours and not finding the time to do with his walking  He had difficulty getting his supply of metformin from mail order and has not taken any in the last 3 weeks  He thinks because of this his blood sugars are running higher; lab glucose 10/04/1985 fasting  He has only a couple of recent readings in the late afternoon with one high reading  Has no difficulty doing his injections, with not taking metformin recently he has gone up to 25 units on his [place] but his fasting readings are still relatively high  Glucose monitoring: Using  Touch ultra meter less than once a day.     Blood Glucose readings   PRE-MEAL Fasting Lunch Dinner  3 AM Overall  Glucose range:  152-179   121, 170  129   Mean/median:     150    Previous AVERAGE for 30 days = 135    Wt Readings from Last 3 Encounters:  09/06/18 278 lb 3.2 oz (126.2 kg)  03/14/18 279 lb (126.6 kg)  12/08/17 280 lb (127 kg)      Lab Results  Component Value Date   HGBA1C 7.4 (H) 06/05/2019   HGBA1C 7.4 (  H) 01/29/2019   HGBA1C 6.9 (H) 09/04/2018   Lab Results  Component Value Date   MICROALBUR <0.7 06/05/2019   LDLCALC 100 (H) 01/29/2019   CREATININE 1.04 06/05/2019     Lab Results  Component Value Date   MICROALBUR <0.7 06/05/2019   Lab on 06/05/2019  Component Date Value Ref Range Status  . Sodium 06/05/2019 138  135 - 145 mEq/L Final  . Potassium 06/05/2019 4.0  3.5 - 5.1 mEq/L Final  . Chloride 06/05/2019 105  96 - 112 mEq/L Final  . CO2 06/05/2019 25  19 - 32 mEq/L Final  . Glucose, Bld 06/05/2019 187* 70 - 99 mg/dL Final  . BUN 06/05/2019 19  6 - 23 mg/dL Final  . Creatinine, Ser 06/05/2019 1.04  0.40 - 1.50 mg/dL Final  . Calcium 06/05/2019 9.4  8.4 -  10.5 mg/dL Final  . GFR 06/05/2019 74.01  >60.00 mL/min Final  . Hgb A1c MFr Bld 06/05/2019 7.4* 4.6 - 6.5 % Final   Glycemic Control Guidelines for People with Diabetes:Non Diabetic:  <6%Goal of Therapy: <7%Additional Action Suggested:  >8%   . Microalb, Ur 06/05/2019 <0.7  0.0 - 1.9 mg/dL Final  . Creatinine,U 06/05/2019 55.3  mg/dL Final  . Microalb Creat Ratio 06/05/2019 1.3  0.0 - 30.0 mg/g Final       Allergies as of 06/08/2019      Reactions   Penicillins Anaphylaxis   Influenza Vac Split Quad    cellulitis   Pneumococcal Vaccines Other (See Comments)   Cellulitis, swelling      Medication List       Accurate as of June 08, 2019  3:16 PM. If you have any questions, ask your nurse or doctor.        allopurinol 100 MG tablet Commonly known as: ZYLOPRIM Take 1 tablet (100 mg total) by mouth 2 (two) times daily as needed. For gout   gabapentin 300 MG capsule Commonly known as: NEURONTIN Take 1 capsule 3 times a day as needed   Insulin Pen Needle 32G X 4 MM Misc Use to inject insulin once daily   Jardiance 25 MG Tabs tablet Generic drug: empagliflozin TAKE 1 TABLET BY MOUTH DAILY   liraglutide 18 MG/3ML Sopn Commonly known as: Victoza INJECT 1.8 MG DAILY   metFORMIN 500 MG 24 hr tablet Commonly known as: GLUCOPHAGE-XR Take 1 tablet (500 mg total) by mouth 2 (two) times a day.   ONE TOUCH ULTRA TEST test strip Generic drug: glucose blood USE TO CHECK BLOOD SUGAR TWICE DAILY AS DIRECTED   OneTouch Delica Lancets 99991111 Misc Use to check blood sugar 2 times per day dx code E11.65   rosuvastatin 10 MG tablet Commonly known as: CRESTOR Take 1 tablet (10 mg total) by mouth daily.   Tyler Aas FlexTouch 100 UNIT/ML Sopn FlexTouch Pen Generic drug: insulin degludec INJECT 0.2ML (20 UNITS     TOTAL) SUBCUTANEOUSLY DAILY       Allergies:  Allergies  Allergen Reactions  . Penicillins Anaphylaxis  . Influenza Vac Split Quad     cellulitis  . Pneumococcal  Vaccines Other (See Comments)    Cellulitis, swelling    Past Medical History:  Diagnosis Date  . Allergy   . Bowel obstruction ([place]) 10/2011  . Diabetes mellitus   . Gout   . Hx of adenomatous polyp of colon 01/22/2015  . Hyperlipidemia   . Hypertension   . Umbilical hernia     Past Surgical History:  Procedure Laterality  Date  . APPENDECTOMY     60s    Family History  Problem Relation Age of Onset  . Diabetes Mother   . Hyperlipidemia Father   . Hypertension Father   . Dementia Father   . Colon cancer Neg Hx   . Esophageal cancer Neg Hx   . Stomach cancer Neg Hx   . Rectal cancer Neg Hx     Social History:  reports that he has been smoking cigarettes and cigars. He has been smoking about 0.25 packs per day. He has never used smokeless tobacco. He reports current alcohol use. He reports that he does not use drugs.    Review of Systems      Hyperlipidemia: He has mixed hyperlipidemia with increased LDL and persistently low HDL  He is back to taking 10 mg Crestor which he is tolerating without muscle pain and LDL had been controlled Previously on Lipitor which caused muscle aches  Lipids are improved although HDL still low  Lab Results  Component Value Date   CHOL 168 01/29/2019   CHOL 247 (H) 09/04/2018   CHOL 145 07/07/2017   Lab Results  Component Value Date   HDL 28.10 (L) 01/29/2019   HDL 33.80 (L) 09/04/2018   HDL 28.40 (L) 07/07/2017   Lab Results  Component Value Date   LDLCALC 100 (H) 01/29/2019   LDLCALC 85 07/07/2017   LDLCALC 90 08/25/2016   Lab Results  Component Value Date   TRIG 200.0 (H) 01/29/2019   TRIG 353.0 (H) 09/04/2018   TRIG 158.0 (H) 07/07/2017   Lab Results  Component Value Date   CHOLHDL 6 01/29/2019   CHOLHDL 7 09/04/2018   CHOLHDL 5 07/07/2017   Lab Results  Component Value Date   LDLDIRECT 146.0 09/04/2018   LDLDIRECT 118.0 10/09/2015   LDLDIRECT 183.0 04/03/2013     Neuropathy:   He has had painful  burning sensation in his feet ; not needing gabapentin   Foot exam was normal in 09/2018  Blood pressure: Has variable blood pressure readings in the office, somewhat higher if he is anxious No recent readings at home  BP Readings from Last 3 Encounters:  09/06/18 130/80  03/14/18 130/84  12/08/17 116/80     LABS:   Lab on 06/05/2019  Component Date Value Ref Range Status  . Sodium 06/05/2019 138  135 - 145 mEq/L Final  . Potassium 06/05/2019 4.0  3.5 - 5.1 mEq/L Final  . Chloride 06/05/2019 105  96 - 112 mEq/L Final  . CO2 06/05/2019 25  19 - 32 mEq/L Final  . Glucose, Bld 06/05/2019 187* 70 - 99 mg/dL Final  . BUN 06/05/2019 19  6 - 23 mg/dL Final  . Creatinine, Ser 06/05/2019 1.04  0.40 - 1.50 mg/dL Final  . Calcium 06/05/2019 9.4  8.4 - 10.5 mg/dL Final  . GFR 06/05/2019 74.01  >60.00 mL/min Final  . Hgb A1c MFr Bld 06/05/2019 7.4* 4.6 - 6.5 % Final   Glycemic Control Guidelines for People with Diabetes:Non Diabetic:  <6%Goal of Therapy: <7%Additional Action Suggested:  >8%   . Microalb, Ur 06/05/2019 <0.7  0.0 - 1.9 mg/dL Final  . Creatinine,U 06/05/2019 55.3  mg/dL Final  . Microalb Creat Ratio 06/05/2019 1.3  0.0 - 30.0 mg/g Final    Physical Examination:  There were no vitals taken for this visit.    ASSESSMENT/PLAN:  Diabetes type 2 with obesity   See history of present illness for detailed discussion of current diabetes  management, blood sugar patterns and problems identified  He is on a regimen of basal insulin, Jardiance and Victoza 1.8 mg  His A1c is 7.4 as before  Although his blood sugars may be higher from not being on metformin the last 3 weeks not clear if he is still having high readings more consistently Information from meter available only for the last few days with average 150 for 30 days  He will let us know if he has consistently high readings after being on metformin for some time Continue adjusting Tresiba based on fasting readings  More consistent readings after meals including after lunch Continue regimen of regular exercise and increase as tolerated  For simplicity of weekly injections and possible improvement in overall control he will be given a trial [id number] when he finishes Victoza Explained how this is different, dosage titration, possible side effects, long-term benefits He can try 0.25 mg weekly for the first 2 injections and then 0.5 Once he finishes the first plan he will call to let us know this and we will switch him to the 1 mg pen Given information on the medication through my chart message and how to get a co-pay card  LIPIDS: Will need follow-up levels, LDL needs to be below 100 Continue Crestor  He will set up follow-up with PCP especially if he has to be started on antihypertensives, currently not monitoring at home  Will review control in 3 months  Counseling time on subjects discussed in assessment and plan sections is over 50% of today's 25 minute visit   There are no Patient Instructions on file for this visit.   Elayne Snare 06/08/2019, 3:16 PM

## 2019-07-11 ENCOUNTER — Other Ambulatory Visit: Payer: Self-pay | Admitting: Endocrinology

## 2019-07-16 LAB — HM DIABETES EYE EXAM

## 2019-07-27 ENCOUNTER — Other Ambulatory Visit: Payer: Self-pay | Admitting: Endocrinology

## 2019-08-09 ENCOUNTER — Other Ambulatory Visit: Payer: Self-pay

## 2019-08-09 MED ORDER — OZEMPIC (1 MG/DOSE) 2 MG/1.5ML ~~LOC~~ SOPN: 1.0000 mg | PEN_INJECTOR | SUBCUTANEOUS | 2 refills | Status: DC

## 2019-08-23 ENCOUNTER — Other Ambulatory Visit: Payer: Self-pay

## 2019-08-23 MED ORDER — METFORMIN HCL ER 500 MG PO TB24: ORAL_TABLET | ORAL | 1 refills | Status: DC

## 2019-09-11 ENCOUNTER — Other Ambulatory Visit: Payer: Self-pay

## 2019-09-11 ENCOUNTER — Other Ambulatory Visit (INDEPENDENT_AMBULATORY_CARE_PROVIDER_SITE_OTHER): Payer: BC Managed Care – PPO

## 2019-09-11 DIAGNOSIS — Z794 Long term (current) use of insulin: Secondary | ICD-10-CM | POA: Diagnosis not present

## 2019-09-11 DIAGNOSIS — E782 Mixed hyperlipidemia: Secondary | ICD-10-CM

## 2019-09-11 DIAGNOSIS — E1165 Type 2 diabetes mellitus with hyperglycemia: Secondary | ICD-10-CM

## 2019-09-11 LAB — COMPREHENSIVE METABOLIC PANEL
ALT: 26 U/L (ref 0–53)
AST: 18 U/L (ref 0–37)
Albumin: 4.3 g/dL (ref 3.5–5.2)
Alkaline Phosphatase: 60 U/L (ref 39–117)
BUN: 13 mg/dL (ref 6–23)
CO2: 25 mEq/L (ref 19–32)
Calcium: 9.4 mg/dL (ref 8.4–10.5)
Chloride: 105 mEq/L (ref 96–112)
Creatinine, Ser: 1.04 mg/dL (ref 0.40–1.50)
GFR: 73.93 mL/min (ref >60.00)
Glucose, Bld: 164 mg/dL — ABNORMAL HIGH (ref 70–99)
Potassium: 4.4 mEq/L (ref 3.5–5.1)
Sodium: 138 mEq/L (ref 135–145)
Total Bilirubin: 0.5 mg/dL (ref 0.2–1.2)
Total Protein: 6.8 g/dL (ref 6.0–8.3)

## 2019-09-11 LAB — HEMOGLOBIN A1C: Hgb A1c MFr Bld: 7.3 % — ABNORMAL HIGH (ref 4.6–6.5)

## 2019-09-11 LAB — LDL CHOLESTEROL, DIRECT: Direct LDL: 141 mg/dL

## 2019-09-14 ENCOUNTER — Ambulatory Visit: Payer: BC Managed Care – PPO | Admitting: Endocrinology

## 2019-09-17 ENCOUNTER — Ambulatory Visit (INDEPENDENT_AMBULATORY_CARE_PROVIDER_SITE_OTHER): Payer: BC Managed Care – PPO | Admitting: Endocrinology

## 2019-09-17 ENCOUNTER — Other Ambulatory Visit: Payer: Self-pay

## 2019-09-17 ENCOUNTER — Encounter: Payer: Self-pay | Admitting: Endocrinology

## 2019-09-17 DIAGNOSIS — E1165 Type 2 diabetes mellitus with hyperglycemia: Secondary | ICD-10-CM

## 2019-09-17 DIAGNOSIS — Z794 Long term (current) use of insulin: Secondary | ICD-10-CM | POA: Diagnosis not present

## 2019-09-17 DIAGNOSIS — E782 Mixed hyperlipidemia: Secondary | ICD-10-CM | POA: Diagnosis not present

## 2019-09-17 MED ORDER — ROSUVASTATIN CALCIUM 10 MG PO TABS: 10.0000 mg | ORAL_TABLET | Freq: Every day | ORAL | 1 refills | Status: DC

## 2019-09-17 NOTE — Patient Instructions (Signed)
  Checking blood sugars:  do a few readings 2 to 3 hours after dinner alternating with morning readings  Try to walk briskly 10 to 20 minutes/day even if you cannot do it at one stretch.  No change in medications but if her morning sugars are consistently over 140 increase the [place] to 22 units  Measure blood pressure at least couple of times a month, a good brand for blood pressure meter is Omron.  Will start rosuvastatin/Crestor, this needs to be taken long-term

## 2019-09-17 NOTE — Progress Notes (Signed)
Patient ID: Greg Kane, male   DOB: 29-Feb-1966, 53 y.o.   MRN: [id number]   Today's office visit was provided via telemedicine using video technique Explained to the patient and the the limitations of evaluation and management by telemedicine and the availability of in person appointments.  The patient understood the limitations and agreed to proceed. Patient also understood that the telehealth visit is billable. . Location of the patient: Home . Location of the provider: Office Only the patient and myself were participating in the encounter    Reason for Appointment :  Follow up  History of Present Illness          Diagnosis: Type 2 diabetes mellitus, date of diagnosis 2008  Background information: Initial diagnosis was made incidentally and had only mild hyperglycemia. Initially was treated with metformin alone and then Actos was added He has not had any formal diabetes education and has had difficulty losing weight His blood sugars have been only fairly well controlled with A1c usually in the 7-8 range Because of inadequate control in 2013 he was given Victoza injection in addition. However Actos was stopped  He thinks this helped bring his blood sugars better controlled but helped his portion control only slightly He did not lose much weight with Victoza In 6/14 he was given Invokana in addition and his Victoza was resumed.  RECENT history    Treatment regimen: Insulin:  Tresiba 20 units in a.m., non-insulin drugs: Ozempic 1 mg weekly, Jardiance 25 mg, metformin 2000 mg  A1c is still somewhat high at 7.3 compared to 6.9 at the lowest level   Recent blood sugar patterns and problems identified:  He was switched from Victoza to [place] after his visit in September  He has moved up to the 1 mg dosage about a month or so ago  With this he thinks that he is having better satiety and is able to watch his diet better such as cutting back on  carbohydrates  However he thinks his sugars were higher when he was not able to get his prescription for Metformin refill for 2 weeks last month  Given though his fasting readings are mostly above target his blood sugars at dinnertime are reportedly normal or low normal  As before he forgets to check his sugars after dinner at night  Previously had taken as much as 25 units of [place] but now taking only 20  He thinks that because of his busy work schedule he is not doing any exercise  Recent weight is about 270 at home  Renal dysfunction stable with continuing Jardiance  Glucose monitoring: Using  Touch ultra meter less than once a day.      Blood Glucose readings   PRE-MEAL Fasting Lunch Dinner Bedtime Overall  Glucose range:  122-162   67, 100    Mean/median:     151    PREVIOUS readings:   PRE-MEAL Fasting Lunch Dinner  3 AM Overall  Glucose range:  152-179   121, 170  129   Mean/median:     150      Wt Readings from Last 3 Encounters:  09/06/18 278 lb 3.2 oz (126.2 kg)  03/14/18 279 lb (126.6 kg)  12/08/17 280 lb (127 kg)      Lab Results  Component Value Date   HGBA1C 7.3 (H) 09/11/2019   HGBA1C 7.4 (H) 06/05/2019   HGBA1C 7.4 (H) 01/29/2019   Lab Results  Component Value Date   MICROALBUR <0.7  06/05/2019   LDLCALC 100 (H) 01/29/2019   CREATININE 1.04 09/11/2019     Lab Results  Component Value Date   MICROALBUR <0.7 06/05/2019   Lab on 09/11/2019  Component Date Value Ref Range Status  . Direct LDL 09/11/2019 141.0  mg/dL Final   Optimal:  <100 mg/dLNear or Above Optimal:  100-129 mg/dLBorderline High:  130-159 mg/dLHigh:  160-189 mg/dLVery High:  >190 mg/dL  . Sodium 09/11/2019 138  135 - 145 mEq/L Final  . Potassium 09/11/2019 4.4  3.5 - 5.1 mEq/L Final  . Chloride 09/11/2019 105  96 - 112 mEq/L Final  . CO2 09/11/2019 25  19 - 32 mEq/L Final  . Glucose, Bld 09/11/2019 164* 70 - 99 mg/dL Final  . BUN 09/11/2019 13  6 - 23 mg/dL Final  .  Creatinine, Ser 09/11/2019 1.04  0.40 - 1.50 mg/dL Final  . Total Bilirubin 09/11/2019 0.5  0.2 - 1.2 mg/dL Final  . Alkaline Phosphatase 09/11/2019 60  39 - 117 U/L Final  . AST 09/11/2019 18  0 - 37 U/L Final  . ALT 09/11/2019 26  0 - 53 U/L Final  . Total Protein 09/11/2019 6.8  6.0 - 8.3 g/dL Final  . Albumin 09/11/2019 4.3  3.5 - 5.2 g/dL Final  . GFR 09/11/2019 73.93  >60.00 mL/min Final  . Calcium 09/11/2019 9.4  8.4 - 10.5 mg/dL Final  . Hgb A1c MFr Bld 09/11/2019 7.3* 4.6 - 6.5 % Final   Glycemic Control Guidelines for People with Diabetes:Non Diabetic:  <6%Goal of Therapy: <7%Additional Action Suggested:  >8%        Allergies as of 09/17/2019      Reactions   Penicillins Anaphylaxis   Influenza Vac Split Quad    cellulitis   Pneumococcal Vaccines Other (See Comments)   Cellulitis, swelling      Medication List       Accurate as of September 17, 2019  8:30 PM. If you have any questions, ask your nurse or doctor.        STOP taking these medications   liraglutide 18 MG/3ML Sopn Commonly known as: Victoza Stopped by: Elayne Snare, MD     TAKE these medications   allopurinol 100 MG tablet Commonly known as: ZYLOPRIM Take 1 tablet (100 mg total) by mouth 2 (two) times daily as needed. For gout   gabapentin 300 MG capsule Commonly known as: NEURONTIN Take 1 capsule 3 times a day as needed   Insulin Pen Needle 32G X 4 MM Misc Use to inject insulin once daily   Jardiance 25 MG Tabs tablet Generic drug: empagliflozin TAKE 1 TABLET BY MOUTH DAILY   metFORMIN 500 MG 24 hr tablet Commonly known as: GLUCOPHAGE-XR Take 2 tablets by mouth twice daily.   OneTouch Delica Lancets 99991111 Misc Use to check blood sugar 2 times per day dx code E11.65   OneTouch Ultra test strip Generic drug: glucose blood USE TO CHECK BLOOD SUGAR TWICE DAILY.   Ozempic (1 MG/DOSE) 2 MG/1.5ML Sopn Generic drug: Semaglutide (1 MG/DOSE) Inject 1 mg into the skin once a week. Inject 1mg   under the skin once weekly.   rosuvastatin 10 MG tablet Commonly known as: CRESTOR Take 1 tablet (10 mg total) by mouth daily.   Tyler Aas FlexTouch 100 UNIT/ML Sopn FlexTouch Pen Generic drug: insulin degludec INJECT 0.2ML (20 UNITS     TOTAL) SUBCUTANEOUSLY DAILY       Allergies:  Allergies  Allergen Reactions  . Penicillins Anaphylaxis  .  Influenza Vac Split Quad     cellulitis  . Pneumococcal Vaccines Other (See Comments)    Cellulitis, swelling    Past Medical History:  Diagnosis Date  . Allergy   . Bowel obstruction ([place]) 10/2011  . Diabetes mellitus   . Gout   . Hx of adenomatous polyp of colon 01/22/2015  . Hyperlipidemia   . Hypertension   . Umbilical hernia     Past Surgical History:  Procedure Laterality Date  . APPENDECTOMY     1980s    Family History  Problem Relation Age of Onset  . Diabetes Mother   . Hyperlipidemia Father   . Hypertension Father   . Dementia Father   . Colon cancer Neg Hx   . Esophageal cancer Neg Hx   . Stomach cancer Neg Hx   . Rectal cancer Neg Hx     Social History:  reports that he has been smoking cigarettes and cigars. He has been smoking about 0.25 packs per day. He has never used smokeless tobacco. He reports current alcohol use. He reports that he does not use drugs.    Review of Systems      Hyperlipidemia: He has mixed hyperlipidemia with increased LDL and persistently low HDL  He had gone back to taking 10 mg Crestor previously but apparently did not refill this prescription this summer and LDL is significantly higher  Previously on Lipitor which caused muscle aches Also has had low HDL and variable triglycerides  Lab Results  Component Value Date   CHOL 168 01/29/2019   CHOL 247 (H) 09/04/2018   CHOL 145 07/07/2017   Lab Results  Component Value Date   HDL 28.10 (L) 01/29/2019   HDL 33.80 (L) 09/04/2018   HDL 28.40 (L) 07/07/2017   Lab Results  Component Value Date   LDLCALC 100 (H) 01/29/2019    LDLCALC 85 07/07/2017   LDLCALC 90 08/25/2016   Lab Results  Component Value Date   TRIG 200.0 (H) 01/29/2019   TRIG 353.0 (H) 09/04/2018   TRIG 158.0 (H) 07/07/2017   Lab Results  Component Value Date   CHOLHDL 6 01/29/2019   CHOLHDL 7 09/04/2018   CHOLHDL 5 07/07/2017   Lab Results  Component Value Date   LDLDIRECT 141.0 09/11/2019   LDLDIRECT 146.0 09/04/2018   LDLDIRECT 118.0 10/09/2015     Neuropathy:   He has had painful burning sensation in his feet sometimes at night but mostly when he thinks his sugars are higher; not needing gabapentin currently   Foot exam was normal in 09/2018  Blood pressure: Has had variable blood pressure readings in the office, somewhat higher if he is anxious Not doing blood pressure readings at home and not clear what his recent blood pressure readings are  BP Readings from Last 3 Encounters:  09/06/18 130/80  03/14/18 130/84  12/08/17 116/80     LABS:   Lab on 09/11/2019  Component Date Value Ref Range Status  . Direct LDL 09/11/2019 141.0  mg/dL Final   Optimal:  <100 mg/dLNear or Above Optimal:  100-129 mg/dLBorderline High:  130-159 mg/dLHigh:  160-189 mg/dLVery High:  >190 mg/dL  . Sodium 09/11/2019 138  135 - 145 mEq/L Final  . Potassium 09/11/2019 4.4  3.5 - 5.1 mEq/L Final  . Chloride 09/11/2019 105  96 - 112 mEq/L Final  . CO2 09/11/2019 25  19 - 32 mEq/L Final  . Glucose, Bld 09/11/2019 164* 70 - 99 mg/dL Final  . BUN 09/11/2019  13  6 - 23 mg/dL Final  . Creatinine, Ser 09/11/2019 1.04  0.40 - 1.50 mg/dL Final  . Total Bilirubin 09/11/2019 0.5  0.2 - 1.2 mg/dL Final  . Alkaline Phosphatase 09/11/2019 60  39 - 117 U/L Final  . AST 09/11/2019 18  0 - 37 U/L Final  . ALT 09/11/2019 26  0 - 53 U/L Final  . Total Protein 09/11/2019 6.8  6.0 - 8.3 g/dL Final  . Albumin 09/11/2019 4.3  3.5 - 5.2 g/dL Final  . GFR 09/11/2019 73.93  >60.00 mL/min Final  . Calcium 09/11/2019 9.4  8.4 - 10.5 mg/dL Final  . Hgb A1c MFr Bld  09/11/2019 7.3* 4.6 - 6.5 % Final   Glycemic Control Guidelines for People with Diabetes:Non Diabetic:  <6%Goal of Therapy: <7%Additional Action Suggested:  >8%     Physical Examination:  There were no vitals taken for this visit.    ASSESSMENT/PLAN:  Diabetes type 2 with obesity   See history of present illness for detailed discussion of current diabetes management, blood sugar patterns and problems identified  He is on a regimen of basal insulin, Jardiance, Metformin and Ozempic  His A1c is about the same at 7.3  Recent blood glucose readings are fairly good although still mostly above 120 fasting Postprandial readings not available He subjectively thinks he is doing better with Ozempic and likely may continue to have improved readings and some weight loss As before he still periodically has difficulty getting consistent refills on his Metformin through his mail order Also discussed need for regular exercise  LIPIDS: Not controlled and this is likely to be from his not refilling his Crestor Explained to him what his LDL target should be and what it is currently He needs to restart Crestor  Recommended getting a blood pressure monitor for home     Patient Instructions   Checking blood sugars:  do a few readings 2 to 3 hours after dinner alternating with morning readings  Try to walk briskly 10 to 20 minutes/day even if you cannot do it at one stretch.  No change in medications but if her morning sugars are consistently over 140 increase the [place] to 22 units  Measure blood pressure at least couple of times a month, a good brand for blood pressure meter is Omron.  Will start rosuvastatin/Crestor, this needs to be taken long-term     Elayne Snare 09/17/2019, 8:30 PM

## 2019-09-18 ENCOUNTER — Other Ambulatory Visit: Payer: Self-pay

## 2019-09-18 MED ORDER — ROSUVASTATIN CALCIUM 10 MG PO TABS: 10.0000 mg | ORAL_TABLET | Freq: Every day | ORAL | 1 refills | Status: DC

## 2019-11-04 ENCOUNTER — Other Ambulatory Visit: Payer: Self-pay | Admitting: Endocrinology

## 2019-11-10 ENCOUNTER — Other Ambulatory Visit: Payer: Self-pay | Admitting: Endocrinology

## 2019-12-23 ENCOUNTER — Encounter: Payer: Self-pay | Admitting: Internal Medicine

## 2020-02-14 ENCOUNTER — Other Ambulatory Visit: Payer: Self-pay

## 2020-02-14 MED ORDER — OZEMPIC (1 MG/DOSE) 4 MG/3ML ~~LOC~~ SOPN: 1.0000 mg | PEN_INJECTOR | SUBCUTANEOUS | 2 refills | Status: DC

## 2020-03-10 ENCOUNTER — Other Ambulatory Visit: Payer: Self-pay

## 2020-03-10 ENCOUNTER — Other Ambulatory Visit: Payer: Self-pay | Admitting: Endocrinology

## 2020-03-10 MED ORDER — EMPAGLIFLOZIN 25 MG PO TABS: 25.0000 mg | ORAL_TABLET | Freq: Every day | ORAL | 3 refills | Status: DC

## 2020-04-18 ENCOUNTER — Other Ambulatory Visit: Payer: Self-pay

## 2020-04-18 ENCOUNTER — Other Ambulatory Visit (INDEPENDENT_AMBULATORY_CARE_PROVIDER_SITE_OTHER): Payer: BC Managed Care – PPO

## 2020-04-18 DIAGNOSIS — E1165 Type 2 diabetes mellitus with hyperglycemia: Secondary | ICD-10-CM | POA: Diagnosis not present

## 2020-04-18 DIAGNOSIS — E782 Mixed hyperlipidemia: Secondary | ICD-10-CM | POA: Diagnosis not present

## 2020-04-18 DIAGNOSIS — Z794 Long term (current) use of insulin: Secondary | ICD-10-CM | POA: Diagnosis not present

## 2020-04-18 LAB — COMPREHENSIVE METABOLIC PANEL
ALT: 21 U/L (ref 0–53)
AST: 21 U/L (ref 0–37)
Albumin: 4.5 g/dL (ref 3.5–5.2)
Alkaline Phosphatase: 53 U/L (ref 39–117)
BUN: 20 mg/dL (ref 6–23)
CO2: 24 mEq/L (ref 19–32)
Calcium: 9.2 mg/dL (ref 8.4–10.5)
Chloride: 105 mEq/L (ref 96–112)
Creatinine, Ser: 1.03 mg/dL (ref 0.40–1.50)
GFR: 74.6 mL/min (ref >60.00)
Glucose, Bld: 151 mg/dL — ABNORMAL HIGH (ref 70–99)
Potassium: 3.7 mEq/L (ref 3.5–5.1)
Sodium: 137 mEq/L (ref 135–145)
Total Bilirubin: 0.6 mg/dL (ref 0.2–1.2)
Total Protein: 6.8 g/dL (ref 6.0–8.3)

## 2020-04-18 LAB — HEMOGLOBIN A1C: Hgb A1c MFr Bld: 6.6 % — ABNORMAL HIGH (ref 4.6–6.5)

## 2020-04-18 LAB — LIPID PANEL
Cholesterol: 147 mg/dL (ref 0–200)
HDL: 29.4 mg/dL — ABNORMAL LOW (ref >39.00)
LDL Cholesterol: 78 mg/dL (ref 0–99)
NonHDL: 117.15
Total CHOL/HDL Ratio: 5
Triglycerides: 196 mg/dL — ABNORMAL HIGH (ref 0.0–149.0)
VLDL: 39.2 mg/dL (ref 0.0–40.0)

## 2020-04-22 ENCOUNTER — Encounter: Payer: Self-pay | Admitting: Endocrinology

## 2020-04-22 ENCOUNTER — Ambulatory Visit: Payer: BC Managed Care – PPO | Admitting: Endocrinology

## 2020-04-22 ENCOUNTER — Other Ambulatory Visit: Payer: Self-pay

## 2020-04-22 VITALS — BP 118/70 | HR 94 | Ht 70.0 in | Wt 264.2 lb

## 2020-04-22 DIAGNOSIS — E782 Mixed hyperlipidemia: Secondary | ICD-10-CM

## 2020-04-22 DIAGNOSIS — Z794 Long term (current) use of insulin: Secondary | ICD-10-CM

## 2020-04-22 DIAGNOSIS — E1165 Type 2 diabetes mellitus with hyperglycemia: Secondary | ICD-10-CM

## 2020-04-22 LAB — GLUCOSE, POCT (MANUAL RESULT ENTRY): POC Glucose: 126 mg/dl — AB (ref 70–99)

## 2020-04-22 NOTE — Progress Notes (Signed)
Patient ID: Greg Kane, male   DOB: 1966-03-14, 54 y.o.   MRN: [id number]    Reason for Appointment :  Follow up  History of Present Illness          Diagnosis: Type 2 diabetes mellitus, date of diagnosis 2008  Background information: Initial diagnosis was made incidentally and had only mild hyperglycemia. Initially was treated with metformin alone and then Actos was added He has not had any formal diabetes education and has had difficulty losing weight His blood sugars have been only fairly well controlled with A1c usually in the 7-8 range Because of inadequate control in 2013 he was given Victoza injection in addition. However Actos was stopped  He thinks this helped bring his blood sugars better controlled but helped his portion control only slightly He did not lose much weight with Victoza In 6/14 he was given Invokana in addition and his Victoza was resumed.  RECENT history    Treatment regimen: Insulin:  Tresiba 20 units in a.m., non-insulin drugs: Ozempic 1 mg weekly, Jardiance 25 mg, metformin 2000 mg  A1c is now at his best level of 6.6 compared to 7.3 Last visit was in 12/20   Recent blood sugar patterns and problems identified:  He has been on 1 mg Ozempic since about 08/2019  However he did not come back for follow-up subsequently  Also did not bring his meter for download today  Lab blood sugar was 151 after lunch but today it is 126 in the afternoon  He thinks his average 30-day blood sugar is about 125  As before he probably checks blood sugars mostly in the mornings and some before dinner  However he thinks that if he checks his readings after meals it is 150 or less  Otherwise morning blood sugars are as low as 108, highest 171 in the last 3 months  Generally trying to improve his diet with reduced carbohydrates, less pasta and high-fat meals, only occasionally may have dessert  He thinks he has lost about 20 pounds this year  He is  trying to do some walking on weekends, otherwise is relatively active during the weekdays  No hypoglycemia and has not changed his Tresiba dose of 20 units  He is asking about stopping Jardiance  Glucose monitoring: Using  Touch ultra meter less than once a day.      Blood Glucose readings by recall as above   PREVIOUS readings:  PRE-MEAL Fasting Lunch Dinner Bedtime Overall  Glucose range:  122-162   67, 100    Mean/median:     151     Wt Readings from Last 3 Encounters:  04/22/20 264 lb 3.2 oz (119.8 kg)  09/06/18 278 lb 3.2 oz (126.2 kg)  03/14/18 279 lb (126.6 kg)      Lab Results  Component Value Date   HGBA1C 6.6 (H) 04/18/2020   HGBA1C 7.3 (H) 09/11/2019   HGBA1C 7.4 (H) 06/05/2019   Lab Results  Component Value Date   MICROALBUR <0.7 06/05/2019   LDLCALC 78 04/18/2020   CREATININE 1.03 04/18/2020     Lab Results  Component Value Date   MICROALBUR <0.7 06/05/2019   Office Visit on 04/22/2020  Component Date Value Ref Range Status   POC Glucose 04/22/2020 126* 70 - 99 mg/dl Final  Lab on 04/18/2020  Component Date Value Ref Range Status   Cholesterol 04/18/2020 147  0 - 200 mg/dL Final   ATP III Classification  Desirable:  < 200 mg/dL               Borderline High:  200 - 239 mg/dL          High:  > = 240 mg/dL   Triglycerides 04/18/2020 196.0* 0 - 149 mg/dL Final   Normal:  <150 mg/dLBorderline High:  150 - 199 mg/dL   HDL 04/18/2020 29.40* >39.00 mg/dL Final   VLDL 04/18/2020 39.2  0.0 - 40.0 mg/dL Final   LDL Cholesterol 04/18/2020 78  0 - 99 mg/dL Final   Total CHOL/HDL Ratio 04/18/2020 5   Final                  Men          Women1/2 Average Risk     3.4          3.3Average Risk          5.0          4.42X Average Risk          9.6          7.13X Average Risk          15.0          11.0                       NonHDL 04/18/2020 117.15   Final   NOTE:  Non-HDL goal should be 30 mg/dL higher than patient's LDL goal (i.e. LDL goal of < 70  mg/dL, would have non-HDL goal of < 100 mg/dL)   Sodium 04/18/2020 137  135 - 145 mEq/L Final   Potassium 04/18/2020 3.7  3.5 - 5.1 mEq/L Final   Chloride 04/18/2020 105  96 - 112 mEq/L Final   CO2 04/18/2020 24  19 - 32 mEq/L Final   Glucose, Bld 04/18/2020 151* 70 - 99 mg/dL Final   BUN 04/18/2020 20  6 - 23 mg/dL Final   Creatinine, Ser 04/18/2020 1.03  0.40 - 1.50 mg/dL Final   Total Bilirubin 04/18/2020 0.6  0.2 - 1.2 mg/dL Final   Alkaline Phosphatase 04/18/2020 53  39 - 117 U/L Final   AST 04/18/2020 21  0 - 37 U/L Final   ALT 04/18/2020 21  0 - 53 U/L Final   Total Protein 04/18/2020 6.8  6.0 - 8.3 g/dL Final   Albumin 04/18/2020 4.5  3.5 - 5.2 g/dL Final   GFR 04/18/2020 74.60  >60.00 mL/min Final   Calcium 04/18/2020 9.2  8.4 - 10.5 mg/dL Final   Hgb A1c MFr Bld 04/18/2020 6.6* 4.6 - 6.5 % Final   Glycemic Control Guidelines for People with Diabetes:Non Diabetic:  <6%Goal of Therapy: <7%Additional Action Suggested:  >8%        Allergies as of 04/22/2020      Reactions   Penicillins Anaphylaxis   Influenza Vac Split Quad    cellulitis   Pneumococcal Vaccines Other (See Comments)   Cellulitis, swelling      Medication List       Accurate as of April 22, 2020  3:12 PM. If you have any questions, ask your nurse or doctor.        allopurinol 100 MG tablet Commonly known as: ZYLOPRIM Take 1 tablet (100 mg total) by mouth 2 (two) times daily as needed. For gout   empagliflozin 25 MG Tabs tablet Commonly known as: Jardiance Take 1 tablet (25 mg total) by mouth daily.   gabapentin  300 MG capsule Commonly known as: NEURONTIN Take 1 capsule 3 times a day as needed   Insulin Pen Needle 32G X 4 MM Misc Use to inject insulin once daily   metFORMIN 500 MG 24 hr tablet Commonly known as: GLUCOPHAGE-XR TAKE 2 TABLETS TWICE A DAY   OneTouch Delica Lancets 47Q Misc Use to check blood sugar 2 times per day dx code E11.65   OneTouch Ultra test  strip Generic drug: glucose blood USE TO CHECK BLOOD SUGAR TWICE DAILY.   Ozempic (1 MG/DOSE) 4 MG/3ML Sopn Generic drug: Semaglutide (1 MG/DOSE) Inject 1 mg into the skin every 7 (seven) days.   rosuvastatin 10 MG tablet Commonly known as: CRESTOR Take 1 tablet (10 mg total) by mouth daily.   Tyler Aas FlexTouch 100 UNIT/ML FlexTouch Pen Generic drug: insulin degludec INJECT 0.2ML (20 UNITS     TOTAL) SUBCUTANEOUSLY DAILY       Allergies:  Allergies  Allergen Reactions   Penicillins Anaphylaxis   Influenza Vac Split Quad     cellulitis   Pneumococcal Vaccines Other (See Comments)    Cellulitis, swelling    Past Medical History:  Diagnosis Date   Allergy    Bowel obstruction ([place]) 10/2011   Diabetes mellitus    Gout    Hx of adenomatous polyp of colon 01/22/2015   Hyperlipidemia    Hypertension    Umbilical hernia     Past Surgical History:  Procedure Laterality Date   APPENDECTOMY     1980s   COLONOSCOPY W/ POLYPECTOMY  01/2015    Family History  Problem Relation Age of Onset   Diabetes Mother    Hyperlipidemia Father    Hypertension Father    Dementia Father    Colon cancer Neg Hx    Esophageal cancer Neg Hx    Stomach cancer Neg Hx    Rectal cancer Neg Hx     Social History:  reports that he has been smoking cigarettes and cigars. He has been smoking about 0.25 packs per day. He has never used smokeless tobacco. He reports current alcohol use. He reports that he does not use drugs.    Review of Systems      Hyperlipidemia: He has mixed hyperlipidemia with increased LDL and persistently low HDL  He has continued 10 mg Crestor as recommended on his last visit  Previously on Lipitor which caused muscle aches LDL is excellent at 78 now  Also has had low HDL and variable triglycerides  Lab Results  Component Value Date   CHOL 147 04/18/2020   CHOL 168 01/29/2019   CHOL 247 (H) 09/04/2018   Lab Results  Component Value  Date   HDL 29.40 (L) 04/18/2020   HDL 28.10 (L) 01/29/2019   HDL 33.80 (L) 09/04/2018   Lab Results  Component Value Date   LDLCALC 78 04/18/2020   LDLCALC 100 (H) 01/29/2019   LDLCALC 85 07/07/2017   Lab Results  Component Value Date   TRIG 196.0 (H) 04/18/2020   TRIG 200.0 (H) 01/29/2019   TRIG 353.0 (H) 09/04/2018   Lab Results  Component Value Date   CHOLHDL 5 04/18/2020   CHOLHDL 6 01/29/2019   CHOLHDL 7 09/04/2018   Lab Results  Component Value Date   LDLDIRECT 141.0 09/11/2019   LDLDIRECT 146.0 09/04/2018   LDLDIRECT 118.0 10/09/2015     Neuropathy:   He has had painful burning sensation in his feet, mainly at night  Symptoms are decreased and mild, still not  needing gabapentin    Foot exam was normal in 04/2020  Blood pressure: Has had variable blood pressure readings in the office, appears better now   BP Readings from Last 3 Encounters:  04/22/20 118/70  09/06/18 130/80  03/14/18 130/84     LABS:   Office Visit on 04/22/2020  Component Date Value Ref Range Status   POC Glucose 04/22/2020 126* 70 - 99 mg/dl Final  Lab on 04/18/2020  Component Date Value Ref Range Status   Cholesterol 04/18/2020 147  0 - 200 mg/dL Final   ATP III Classification       Desirable:  < 200 mg/dL               Borderline High:  200 - 239 mg/dL          High:  > = 240 mg/dL   Triglycerides 04/18/2020 196.0* 0 - 149 mg/dL Final   Normal:  <150 mg/dLBorderline High:  150 - 199 mg/dL   HDL 04/18/2020 29.40* >39.00 mg/dL Final   VLDL 04/18/2020 39.2  0.0 - 40.0 mg/dL Final   LDL Cholesterol 04/18/2020 78  0 - 99 mg/dL Final   Total CHOL/HDL Ratio 04/18/2020 5   Final                  Men          Women1/2 Average Risk     3.4          3.3Average Risk          5.0          4.42X Average Risk          9.6          7.13X Average Risk          15.0          11.0                       NonHDL 04/18/2020 117.15   Final   NOTE:  Non-HDL goal should be 30 mg/dL higher than  patient's LDL goal (i.e. LDL goal of < 70 mg/dL, would have non-HDL goal of < 100 mg/dL)   Sodium 04/18/2020 137  135 - 145 mEq/L Final   Potassium 04/18/2020 3.7  3.5 - 5.1 mEq/L Final   Chloride 04/18/2020 105  96 - 112 mEq/L Final   CO2 04/18/2020 24  19 - 32 mEq/L Final   Glucose, Bld 04/18/2020 151* 70 - 99 mg/dL Final   BUN 04/18/2020 20  6 - 23 mg/dL Final   Creatinine, Ser 04/18/2020 1.03  0.40 - 1.50 mg/dL Final   Total Bilirubin 04/18/2020 0.6  0.2 - 1.2 mg/dL Final   Alkaline Phosphatase 04/18/2020 53  39 - 117 U/L Final   AST 04/18/2020 21  0 - 37 U/L Final   ALT 04/18/2020 21  0 - 53 U/L Final   Total Protein 04/18/2020 6.8  6.0 - 8.3 g/dL Final   Albumin 04/18/2020 4.5  3.5 - 5.2 g/dL Final   GFR 04/18/2020 74.60  >60.00 mL/min Final   Calcium 04/18/2020 9.2  8.4 - 10.5 mg/dL Final   Hgb A1c MFr Bld 04/18/2020 6.6* 4.6 - 6.5 % Final   Glycemic Control Guidelines for People with Diabetes:Non Diabetic:  <6%Goal of Therapy: <7%Additional Action Suggested:  >8%     Physical Examination:  BP 118/70 (BP Location: Left Arm, Patient Position: Sitting, Cuff Size: Large)    Pulse  94    Ht 5\' 10"  (1.778 m)    Wt 264 lb 3.2 oz (119.8 kg)    SpO2 97%    BMI 37.91 kg/m   Diabetic Foot Exam - Simple   Simple Foot Form Diabetic Foot exam was performed with the following findings: Yes   Visual Inspection No deformities, no ulcerations, no other skin breakdown bilaterally: Yes Sensation Testing Intact to touch and monofilament testing bilaterally: Yes Pulse Check Posterior Tibialis and Dorsalis pulse intact bilaterally: Yes Comments      ASSESSMENT/PLAN:  Diabetes type 2 with obesity   See history of present illness for detailed discussion of current diabetes management, blood sugar patterns and problems identified  He is on a regimen of basal insulin, Jardiance, Metformin and Ozempic  His A1c is significantly better at 6.6  Although today he did not bring  his meter for review he thinks his average blood sugar is usually about 125 Mostly checking readings in the mornings He is benefiting from 1 mg Ozempic dose compared to Victoza Also he has generally improved his diet and is able to lose weight now  He will continue his regimen unchanged If blood sugars are starting to get below 100 frequently will need to start reducing insulin Encouraged him to stay active with regular exercise  LIPIDS: Much better control with taking Crestor consistently as well as generally improved diet and weight loss  Foot exam normal today  We will request reports of his eye exam, he is unsure about name of his optometrist   There are no Patient Instructions on file for this visit.   Elayne Snare 04/22/2020, 3:12 PM

## 2020-05-04 ENCOUNTER — Other Ambulatory Visit: Payer: Self-pay | Admitting: Endocrinology

## 2020-05-16 ENCOUNTER — Telehealth: Payer: Self-pay | Admitting: Family Medicine

## 2020-05-16 NOTE — Telephone Encounter (Signed)
You see pts wife. Please advise

## 2020-05-16 NOTE — Telephone Encounter (Signed)
ok 

## 2020-05-16 NOTE — Telephone Encounter (Signed)
Patient would like to re-establish care   Please advise  

## 2020-06-24 ENCOUNTER — Ambulatory Visit: Payer: BC Managed Care – PPO | Admitting: Family Medicine

## 2020-06-24 ENCOUNTER — Encounter: Payer: Self-pay | Admitting: Family Medicine

## 2020-06-24 ENCOUNTER — Other Ambulatory Visit: Payer: Self-pay

## 2020-06-24 VITALS — BP 100/70 | HR 92 | Temp 98.1°F | Resp 18 | Ht 70.0 in | Wt 267.8 lb

## 2020-06-24 DIAGNOSIS — M109 Gout, unspecified: Secondary | ICD-10-CM | POA: Diagnosis not present

## 2020-06-24 DIAGNOSIS — Z0000 Encounter for general adult medical examination without abnormal findings: Secondary | ICD-10-CM

## 2020-06-24 DIAGNOSIS — K429 Umbilical hernia without obstruction or gangrene: Secondary | ICD-10-CM | POA: Diagnosis not present

## 2020-06-24 DIAGNOSIS — Z23 Encounter for immunization: Secondary | ICD-10-CM | POA: Diagnosis not present

## 2020-06-24 DIAGNOSIS — IMO0002 Reserved for concepts with insufficient information to code with codable children: Secondary | ICD-10-CM

## 2020-06-24 DIAGNOSIS — E782 Mixed hyperlipidemia: Secondary | ICD-10-CM | POA: Diagnosis not present

## 2020-06-24 DIAGNOSIS — M1A0790 Idiopathic chronic gout, unspecified ankle and foot, without tophus (tophi): Secondary | ICD-10-CM

## 2020-06-24 DIAGNOSIS — I10 Essential (primary) hypertension: Secondary | ICD-10-CM | POA: Diagnosis not present

## 2020-06-24 DIAGNOSIS — M1A00X0 Idiopathic chronic gout, unspecified site, without tophus (tophi): Secondary | ICD-10-CM

## 2020-06-24 DIAGNOSIS — Z1159 Encounter for screening for other viral diseases: Secondary | ICD-10-CM

## 2020-06-24 DIAGNOSIS — E1151 Type 2 diabetes mellitus with diabetic peripheral angiopathy without gangrene: Secondary | ICD-10-CM

## 2020-06-24 DIAGNOSIS — Z1211 Encounter for screening for malignant neoplasm of colon: Secondary | ICD-10-CM

## 2020-06-24 DIAGNOSIS — E1165 Type 2 diabetes mellitus with hyperglycemia: Secondary | ICD-10-CM

## 2020-06-24 MED ORDER — ALLOPURINOL 100 MG PO TABS: 100.0000 mg | ORAL_TABLET | Freq: Two times a day (BID) | ORAL | 1 refills | Status: AC | PRN

## 2020-06-24 NOTE — Patient Instructions (Signed)
Preventive Care 41-56 Years Old, Male Preventive care refers to lifestyle choices and visits with your health care provider that can promote health and wellness. This includes:  A yearly physical exam. This is also called an annual well check.  Regular dental and eye exams.  Immunizations.  Screening for certain conditions.  Healthy lifestyle choices, such as eating a healthy diet, getting regular exercise, not using drugs or products that contain nicotine and tobacco, and limiting alcohol use. What can I expect for my preventive care visit? Physical exam Your health care provider will check:  Height and weight. These may be used to calculate body mass index (BMI), which is a measurement that tells if you are at a healthy weight.  Heart rate and blood pressure.  Your skin for abnormal spots. Counseling Your health care provider may ask you questions about:  Alcohol, tobacco, and drug use.  Emotional well-being.  Home and relationship well-being.  Sexual activity.  Eating habits.  Work and work Statistician. What immunizations do I need?  Influenza (flu) vaccine  This is recommended every year. Tetanus, diphtheria, and pertussis (Tdap) vaccine  You may need a Td booster every 10 years. Varicella (chickenpox) vaccine  You may need this vaccine if you have not already been vaccinated. Zoster (shingles) vaccine  You may need this after age 64. Measles, mumps, and rubella (MMR) vaccine  You may need at least one dose of MMR if you were born in 1957 or later. You may also need a second dose. Pneumococcal conjugate (PCV13) vaccine  You may need this if you have certain conditions and were not previously vaccinated. Pneumococcal polysaccharide (PPSV23) vaccine  You may need one or two doses if you smoke cigarettes or if you have certain conditions. Meningococcal conjugate (MenACWY) vaccine  You may need this if you have certain conditions. Hepatitis A  vaccine  You may need this if you have certain conditions or if you travel or work in places where you may be exposed to hepatitis A. Hepatitis B vaccine  You may need this if you have certain conditions or if you travel or work in places where you may be exposed to hepatitis B. Haemophilus influenzae type b (Hib) vaccine  You may need this if you have certain risk factors. Human papillomavirus (HPV) vaccine  If recommended by your health care provider, you may need three doses over 6 months. You may receive vaccines as individual doses or as more than one vaccine together in one shot (combination vaccines). Talk with your health care provider about the risks and benefits of combination vaccines. What tests do I need? Blood tests  Lipid and cholesterol levels. These may be checked every 5 years, or more frequently if you are over 56 years old.  Hepatitis C test.  Hepatitis B test. Screening  Lung cancer screening. You may have this screening every year starting at age 56 if you have a 30-pack-year history of smoking and currently smoke or have quit within the past 15 years.  Prostate cancer screening. Recommendations will vary depending on your family history and other risks.  Colorectal cancer screening. All adults should have this screening starting at age 56 and continuing until age 2. Your health care provider may recommend screening at age 56 if you are at increased risk. You will have tests every 1-10 years, depending on your results and the type of screening test.  Diabetes screening. This is done by checking your blood sugar (glucose) after you have not eaten  for a while (fasting). You may have this done every 1-3 years.  Sexually transmitted disease (STD) testing. Follow these instructions at home: Eating and drinking  Eat a diet that includes fresh fruits and vegetables, whole grains, lean protein, and low-fat dairy products.  Take vitamin and mineral supplements as  recommended by your health care provider.  Do not drink alcohol if your health care provider tells you not to drink.  If you drink alcohol: ? Limit how much you have to 0-2 drinks a day. ? Be aware of how much alcohol is in your drink. In the [place] one drink equals one 12 oz bottle of beer (355 mL), one 5 oz glass of wine (148 mL), or one 1 oz glass of hard liquor (44 mL). Lifestyle  Take daily care of your teeth and gums.  Stay active. Exercise for at least 30 minutes on 5 or more days each week.  Do not use any products that contain nicotine or tobacco, such as cigarettes, e-cigarettes, and chewing tobacco. If you need help quitting, ask your health care provider.  If you are sexually active, practice safe sex. Use a condom or other form of protection to prevent STIs (sexually transmitted infections).  Talk with your health care provider about taking a low-dose aspirin every day starting at age 56. What's next?  Go to your health care provider once a year for a well check visit.  Ask your health care provider how often you should have your eyes and teeth checked.  Stay up to date on all vaccines. This information is not intended to replace advice given to you by your health care provider. Make sure you discuss any questions you have with your health care provider. Document Revised: 09/14/2018 Document Reviewed: 09/14/2018 Elsevier Patient Education  2020 [employer].

## 2020-06-24 NOTE — Progress Notes (Signed)
Patient ID: Greg Kane, male    DOB: 08-13-1966  Age: 54 y.o. MRN: [id number]    Subjective:  Subjective  HPI Greg Kane presents for preventative visit and reestablish care.  He has been seeing endo for everything.   He thinks he may have a problem with wax in his ears.   No other complaints   Review of Systems  Constitutional: Negative for appetite change, diaphoresis, fatigue and unexpected weight change.  Eyes: Negative for pain, redness and visual disturbance.  Respiratory: Negative for cough, chest tightness, shortness of breath and wheezing.   Cardiovascular: Negative for chest pain, palpitations and leg swelling.  Endocrine: Negative for cold intolerance, heat intolerance, polydipsia, polyphagia and polyuria.  Genitourinary: Negative for difficulty urinating, dysuria and frequency.  Neurological: Negative for dizziness, light-headedness, numbness and headaches.    History Past Medical History:  Diagnosis Date  . Allergy   . Bowel obstruction ([place]) 10/2011  . Diabetes mellitus   . Gout   . Hx of adenomatous polyp of colon 01/22/2015  . Hyperlipidemia   . Hypertension   . Umbilical hernia     He has a past surgical history that includes Appendectomy; Colonoscopy w/ polypectomy (01/2015); and Lumbar spine surgery (2018).   His family history includes Dementia in his father; Diabetes in his mother; Hyperlipidemia in his father; Hypertension in his father.He reports that he has been smoking cigarettes and cigars. He has a 9.50 pack-year smoking history. He has never used smokeless tobacco. He reports current alcohol use. He reports that he does not use drugs.  Current Outpatient Medications on File Prior to Visit  Medication Sig Dispense Refill  . empagliflozin (JARDIANCE) 25 MG TABS tablet Take 1 tablet (25 mg total) by mouth daily. 30 tablet 3  . gabapentin (NEURONTIN) 300 MG capsule Take 1 capsule 3 times a day as needed 270 capsule 1  . Insulin Pen Needle 32G X 4  MM MISC Use to inject insulin once daily 90 each 1  . metFORMIN (GLUCOPHAGE-XR) 500 MG 24 hr tablet TAKE 2 TABLETS TWICE A DAY 360 tablet 1  . ONETOUCH DELICA LANCETS 06T MISC Use to check blood sugar 2 times per day dx code E11.65 200 each 3  . ONETOUCH ULTRA test strip USE TO CHECK BLOOD SUGAR TWICE DAILY. 100 strip 3  . OZEMPIC, 1 MG/DOSE, 4 MG/3ML SOPN INJECT 1 MG UNDER THE SKIN EVERY 7 DAYS 3 mL 2  . rosuvastatin (CRESTOR) 10 MG tablet Take 1 tablet (10 mg total) by mouth daily. 90 tablet 1  . TRESIBA FLEXTOUCH 100 UNIT/ML SOPN FlexTouch Pen INJECT 0.2ML (20 UNITS     TOTAL) SUBCUTANEOUSLY DAILY 30 mL 1   No current facility-administered medications on file prior to visit.     Objective:  Objective  Physical Exam Vitals and nursing note reviewed.  Constitutional:      General: He is sleeping. He is not in acute distress.    Appearance: He is well-developed. He is not diaphoretic.  HENT:     Head: Normocephalic and atraumatic.     Right Ear: External ear normal. There is no impacted cerumen.     Left Ear: External ear normal. There is no impacted cerumen.     Nose: Nose normal.     Mouth/Throat:     Pharynx: No oropharyngeal exudate.  Eyes:     General:        Right eye: No discharge.        Left eye:  No discharge.     Conjunctiva/sclera: Conjunctivae normal.     Pupils: Pupils are equal, round, and reactive to light.  Neck:     Thyroid: No thyromegaly.     Vascular: No JVD.  Cardiovascular:     Rate and Rhythm: Normal rate and regular rhythm.     Heart sounds: No murmur heard.   Pulmonary:     Effort: Pulmonary effort is normal. No respiratory distress.     Breath sounds: Normal breath sounds. No wheezing or rales.  Chest:     Chest wall: No tenderness.  Abdominal:     General: Bowel sounds are normal. There is no distension.     Palpations: Abdomen is soft. There is no mass.     Tenderness: There is no abdominal tenderness. There is no guarding or rebound.      Hernia: A hernia is present. Hernia is present in the umbilical area.  Musculoskeletal:        General: No tenderness. Normal range of motion.     Cervical back: Normal range of motion and neck supple.  Lymphadenopathy:     Cervical: No cervical adenopathy.  Skin:    General: Skin is warm and dry.     Findings: No erythema or rash.  Neurological:     Mental Status: He is oriented to person, place, and time.     Cranial Nerves: No cranial nerve deficit.     Motor: No abnormal muscle tone.     Deep Tendon Reflexes: Reflexes are normal and symmetric. Reflexes normal.  Psychiatric:        Behavior: Behavior normal.        Thought Content: Thought content normal.        Judgment: Judgment normal.    BP 100/70 (BP Location: Right Arm, Patient Position: Sitting, Cuff Size: Large)   Pulse 92   Temp 98.1 F (36.7 C) (Oral)   Resp 18   Ht 5\' 10"  (1.778 m)   Wt 267 lb 12.8 oz (121.5 kg)   SpO2 96%   BMI 38.43 kg/m  Wt Readings from Last 3 Encounters:  06/24/20 267 lb 12.8 oz (121.5 kg)  04/22/20 264 lb 3.2 oz (119.8 kg)  09/06/18 278 lb 3.2 oz (126.2 kg)     Lab Results  Component Value Date   WBC 10.9 (H) 06/24/2020   HGB 18.2 (H) 06/24/2020   HCT 52.2 (H) 06/24/2020   PLT 267 06/24/2020   GLUCOSE 151 (H) 04/18/2020   CHOL 147 04/18/2020   TRIG 196.0 (H) 04/18/2020   HDL 29.40 (L) 04/18/2020   LDLDIRECT 141.0 09/11/2019   LDLCALC 78 04/18/2020   ALT 21 04/18/2020   AST 21 04/18/2020   NA 137 04/18/2020   K 3.7 04/18/2020   CL 105 04/18/2020   CREATININE 1.03 04/18/2020   BUN 20 04/18/2020   CO2 24 04/18/2020   TSH 1.88 06/24/2020   PSA 1.64 06/24/2020   HGBA1C 6.6 (H) 04/18/2020   MICROALBUR <0.7 06/05/2019    MR Lumbar Spine Wo Contrast  Result Date: 07/10/2015 CLINICAL DATA:  Acute right-sided back pain. Right leg radiculopathy EXAM: MRI LUMBAR SPINE WITHOUT CONTRAST TECHNIQUE: Multiplanar, multisequence MR imaging of the lumbar spine was performed. No  intravenous contrast was administered. COMPARISON:  Lumbar MRI 06/25/2008 FINDINGS: Normal lumbar alignment. Negative for fracture or mass. No bone marrow edema. Conus medullaris is normal and terminates at L1-2. T12-L1:  Mild disc degeneration L1-2:  Mild disc and facet degeneration without spinal  stenosis. L2-3:  Normal disc.  Mild facet degeneration. L3-4: Mild disc degeneration and disc bulging with mild facet degeneration. Mild spinal stenosis unchanged L4-5: Disc degeneration with disc bulging and associated endplate spurring. Small extruded disc fragment on the right extending caudally is unchanged from the prior study. This may be causing compression of the right L5 nerve root in the subarticular zone. Facet hypertrophy and mild foraminal stenosis bilaterally L5-S1: Central and left-sided disc protrusion and spurring . Progressive facet hypertrophy on the left. Progression of moderate left foraminal encroachment. IMPRESSION: Mild disc degeneration and facet degeneration at L1-2, L2-3 and L3-4. Mild spinal stenosis L3-4 Disc degeneration and spurring at L4-5 with mild spinal stenosis. Small extruded disc fragment on the right is unchanged and may be causing right L5 nerve root impingement Progression of left foraminal encroachment L5-S1 due to vertebral and facet spurring. Electronically Signed   By: Franchot Gallo M.D.   On: 07/10/2015 19:14     Assessment & Plan:  Plan  I am having Antonieta Pert. Crofford maintain his gabapentin, OneTouch Delica Lancets 36I, Insulin Pen Needle, OneTouch Ultra, Tresiba FlexTouch, rosuvastatin, metFORMIN, empagliflozin, Ozempic (1 MG/DOSE), and allopurinol.  Meds ordered this encounter  Medications  . allopurinol (ZYLOPRIM) 100 MG tablet    Sig: Take 1 tablet (100 mg total) by mouth 2 (two) times daily as needed. For gout    Dispense:  180 tablet    Refill:  1    Problem List Items Addressed This Visit      Unprioritized   DM (diabetes mellitus) type II  uncontrolled, periph vascular disorder ([place])    Per endo       Essential hypertension    Well controlled, no changes to meds. Encouraged heart healthy diet such as the DASH diet and exercise as tolerated.       Mixed hyperlipidemia    Tolerating statin, encouraged heart healthy diet, avoid trans fats, minimize simple carbs and saturated fats. Increase exercise as tolerated      Preventative health care - Primary   Relevant Orders   CBC with Differential/Platelet (Completed)   PSA (Completed)   TSH (Completed)   Umbilical hernia without obstruction and without gangrene    Pt states it is getting bigger-- still no pain Refer to surgery for eval       Relevant Orders   Ambulatory referral to General Surgery   Uric acid arthropathy   Relevant Medications   allopurinol (ZYLOPRIM) 100 MG tablet    Other Visit Diagnoses    Need for influenza vaccination       Relevant Orders   Flu Vaccine QUAD 36+ mos IM (Completed)   Need for hepatitis C screening test       Relevant Orders   Hepatitis C antibody (Completed)   Colon cancer screening       Relevant Orders   Fecal occult blood, imunochemical      Follow-up: Return in about 1 year (around 06/24/2021), or if symptoms worsen or fail to improve, for annual exam, fasting.  Ann Held, DO

## 2020-06-25 DIAGNOSIS — K429 Umbilical hernia without obstruction or gangrene: Secondary | ICD-10-CM | POA: Insufficient documentation

## 2020-06-25 LAB — CBC WITH DIFFERENTIAL/PLATELET
Absolute Monocytes: 796 cells/uL (ref 200–950)
Basophils Absolute: 55 cells/uL (ref 0–200)
Basophils Relative: 0.5 %
Eosinophils Absolute: 240 cells/uL (ref 15–500)
Eosinophils Relative: 2.2 %
HCT: 52.2 % — ABNORMAL HIGH (ref 38.5–50.0)
Hemoglobin: 18.2 g/dL — ABNORMAL HIGH (ref 13.2–17.1)
Lymphs Abs: 2943 cells/uL (ref 850–3900)
MCH: 30.8 pg (ref 27.0–33.0)
MCHC: 34.9 g/dL (ref 32.0–36.0)
MCV: 88.5 fL (ref 80.0–100.0)
MPV: 10.7 fL (ref 7.5–12.5)
Monocytes Relative: 7.3 %
Neutro Abs: 6867 cells/uL (ref 1500–7800)
Neutrophils Relative %: 63 %
Platelets: 267 10*3/uL (ref 140–400)
RBC: 5.9 10*6/uL — ABNORMAL HIGH (ref 4.20–5.80)
RDW: 13.1 % (ref 11.0–15.0)
Total Lymphocyte: 27 %
WBC: 10.9 10*3/uL — ABNORMAL HIGH (ref 3.8–10.8)

## 2020-06-25 LAB — HEPATITIS C ANTIBODY
Hepatitis C Ab: NONREACTIVE
SIGNAL TO CUT-OFF: 0.01 (ref <1.00)

## 2020-06-25 LAB — PSA: PSA: 1.64 ng/mL (ref <=4.0)

## 2020-06-25 LAB — TSH: TSH: 1.88 mIU/L (ref 0.40–4.50)

## 2020-06-25 NOTE — Assessment & Plan Note (Signed)
Pt states it is getting bigger-- still no pain Refer to surgery for eval

## 2020-06-25 NOTE — Assessment & Plan Note (Signed)
Per endo °

## 2020-06-25 NOTE — Assessment & Plan Note (Signed)
Tolerating statin, encouraged heart healthy diet, avoid trans fats, minimize simple carbs and saturated fats. Increase exercise as tolerated 

## 2020-06-25 NOTE — Assessment & Plan Note (Signed)
Well controlled, no changes to meds. Encouraged heart healthy diet such as the DASH diet and exercise as tolerated.  °

## 2020-06-26 ENCOUNTER — Other Ambulatory Visit: Payer: Self-pay

## 2020-06-26 DIAGNOSIS — D582 Other hemoglobinopathies: Secondary | ICD-10-CM

## 2020-07-01 ENCOUNTER — Other Ambulatory Visit: Payer: Self-pay | Admitting: Endocrinology

## 2020-07-16 ENCOUNTER — Other Ambulatory Visit: Payer: Self-pay | Admitting: Endocrinology

## 2020-08-01 ENCOUNTER — Inpatient Hospital Stay: Payer: BC Managed Care – PPO

## 2020-08-01 ENCOUNTER — Inpatient Hospital Stay: Payer: BC Managed Care – PPO | Admitting: Family

## 2020-08-14 ENCOUNTER — Other Ambulatory Visit: Payer: Self-pay | Admitting: Endocrinology

## 2020-09-08 ENCOUNTER — Other Ambulatory Visit: Payer: Self-pay | Admitting: Endocrinology

## 2020-10-20 ENCOUNTER — Other Ambulatory Visit: Payer: BC Managed Care – PPO

## 2020-10-23 ENCOUNTER — Ambulatory Visit: Payer: BC Managed Care – PPO | Admitting: Endocrinology

## 2020-11-04 ENCOUNTER — Other Ambulatory Visit: Payer: Self-pay

## 2020-11-04 ENCOUNTER — Other Ambulatory Visit (INDEPENDENT_AMBULATORY_CARE_PROVIDER_SITE_OTHER): Payer: BC Managed Care – PPO

## 2020-11-04 DIAGNOSIS — E1165 Type 2 diabetes mellitus with hyperglycemia: Secondary | ICD-10-CM

## 2020-11-04 DIAGNOSIS — E782 Mixed hyperlipidemia: Secondary | ICD-10-CM | POA: Diagnosis not present

## 2020-11-04 DIAGNOSIS — Z794 Long term (current) use of insulin: Secondary | ICD-10-CM | POA: Diagnosis not present

## 2020-11-04 LAB — LIPID PANEL
Cholesterol: 225 mg/dL — ABNORMAL HIGH (ref 0–200)
HDL: 32.7 mg/dL — ABNORMAL LOW (ref >39.00)
NonHDL: 192.01
Total CHOL/HDL Ratio: 7
Triglycerides: 223 mg/dL — ABNORMAL HIGH (ref 0.0–149.0)
VLDL: 44.6 mg/dL — ABNORMAL HIGH (ref 0.0–40.0)

## 2020-11-04 LAB — COMPREHENSIVE METABOLIC PANEL
ALT: 27 U/L (ref 0–53)
AST: 20 U/L (ref 0–37)
Albumin: 4.4 g/dL (ref 3.5–5.2)
Alkaline Phosphatase: 53 U/L (ref 39–117)
BUN: 20 mg/dL (ref 6–23)
CO2: 27 mEq/L (ref 19–32)
Calcium: 9.6 mg/dL (ref 8.4–10.5)
Chloride: 101 mEq/L (ref 96–112)
Creatinine, Ser: 1.13 mg/dL (ref 0.40–1.50)
GFR: 72.45 mL/min (ref >60.00)
Glucose, Bld: 139 mg/dL — ABNORMAL HIGH (ref 70–99)
Potassium: 4.3 mEq/L (ref 3.5–5.1)
Sodium: 135 mEq/L (ref 135–145)
Total Bilirubin: 0.6 mg/dL (ref 0.2–1.2)
Total Protein: 7.2 g/dL (ref 6.0–8.3)

## 2020-11-04 LAB — MICROALBUMIN / CREATININE URINE RATIO
Creatinine,U: 74.5 mg/dL
Microalb Creat Ratio: 0.9 mg/g (ref 0.0–30.0)
Microalb, Ur: <0.7 mg/dL (ref 0.0–1.9)

## 2020-11-04 LAB — HEMOGLOBIN A1C: Hgb A1c MFr Bld: 6.7 % — ABNORMAL HIGH (ref 4.6–6.5)

## 2020-11-04 LAB — LDL CHOLESTEROL, DIRECT: Direct LDL: 160 mg/dL

## 2020-11-06 ENCOUNTER — Encounter: Payer: Self-pay | Admitting: Endocrinology

## 2020-11-06 ENCOUNTER — Other Ambulatory Visit: Payer: Self-pay | Admitting: *Deleted

## 2020-11-06 ENCOUNTER — Other Ambulatory Visit: Payer: Self-pay

## 2020-11-06 ENCOUNTER — Ambulatory Visit: Payer: BC Managed Care – PPO | Admitting: Endocrinology

## 2020-11-06 VITALS — BP 134/82 | HR 89 | Ht 70.0 in | Wt 279.0 lb

## 2020-11-06 DIAGNOSIS — E1165 Type 2 diabetes mellitus with hyperglycemia: Secondary | ICD-10-CM

## 2020-11-06 DIAGNOSIS — Z794 Long term (current) use of insulin: Secondary | ICD-10-CM | POA: Diagnosis not present

## 2020-11-06 DIAGNOSIS — E782 Mixed hyperlipidemia: Secondary | ICD-10-CM

## 2020-11-06 DIAGNOSIS — Z6841 Body Mass Index (BMI) 40.0 and over, adult: Secondary | ICD-10-CM

## 2020-11-06 LAB — HM DIABETES EYE EXAM

## 2020-11-06 MED ORDER — OZEMPIC (1 MG/DOSE) 4 MG/3ML ~~LOC~~ SOPN: PEN_INJECTOR | SUBCUTANEOUS | 1 refills | Status: DC

## 2020-11-06 NOTE — Progress Notes (Signed)
Patient ID: Greg Kane, male   DOB: 1966/02/16, 55 y.o.   MRN: [id number]    Reason for Appointment : Endocrinology follow up  History of Present Illness          DIABETES: He has type 2 diabetes mellitus, date of diagnosis 2008  Background information: Initial diagnosis was made incidentally and had only mild hyperglycemia. Initially was treated with metformin alone and then Actos was added He has not had any formal diabetes education and has had difficulty losing weight His blood sugars have been only fairly well controlled with A1c usually in the 7-8 range Because of inadequate control in 2013 he was given Victoza injection in addition. However Actos was stopped  He thinks this helped bring his blood sugars better controlled but helped his portion control only slightly He did not lose much weight with Victoza In 6/14 he was given Invokana in addition and his Victoza was resumed.  RECENT history    Treatment regimen: Insulin:  Tresiba 20 units in a.m.; non-insulin drugs: Ozempic 1 mg weekly, Jardiance 25 mg, metformin 2000 mg  A1c is still fairly good for him at 6.7 although previously 6.6  Recent blood sugar patterns and problems identified:  He has been on 1 mg Ozempic since about 08/2019, taking every Sunday  He has also continued the same dose of [place] for some time  However he did not come back for follow-up after his last visit in 7/21  Again the did not bring his meter for download today  As before he mostly checks readings in the mornings and usually getting readings of 120-135 lately, lab glucose 139  Previously before he went off his diet in December he thinks he was getting readings as low as 110  Forgetting to check readings after dinner which is his main meal  Previously had about 20 pounds but he has recently gained back 10 pounds  Although he has been doing some walking this has not been consistent especially with recent episode of  Covid  He is now going to start using an exercise bike  Has been consistent with medications  Glucose monitoring: Using  Touch ultra meter less than once a day, readings as above.      Blood Glucose readings not available   Wt Readings from Last 3 Encounters:  11/06/20 279 lb (126.6 kg)  06/24/20 267 lb 12.8 oz (121.5 kg)  04/22/20 264 lb 3.2 oz (119.8 kg)      Lab Results  Component Value Date   HGBA1C 6.7 (H) 11/04/2020   HGBA1C 6.6 (H) 04/18/2020   HGBA1C 7.3 (H) 09/11/2019   Lab Results  Component Value Date   MICROALBUR <0.7 11/04/2020   [place] 78 04/18/2020   CREATININE 1.13 11/04/2020     Lab Results  Component Value Date   MICROALBUR <0.7 11/04/2020   Lab on 11/04/2020  Component Date Value Ref Range Status  . Cholesterol 11/04/2020 225* 0 - 200 mg/dL Final   ATP III Classification       Desirable:  < 200 mg/dL               Borderline High:  200 - 239 mg/dL          High:  > = 240 mg/dL  . Triglycerides 11/04/2020 223.0* 0.0 - 149.0 mg/dL Final   Normal:  <150 mg/dLBorderline High:  150 - 199 mg/dL  . HDL 11/04/2020 32.70* >39.00 mg/dL Final  . VLDL 11/04/2020 44.6* 0.0 -  40.0 mg/dL Final  . Total CHOL/HDL Ratio 11/04/2020 7   Final                  Men          Women1/2 Average Risk     3.4          3.3Average Risk          5.0          4.42X Average Risk          9.6          7.13X Average Risk          15.0          11.0                      . NonHDL 11/04/2020 192.01   Final   NOTE:  Non-HDL goal should be 30 mg/dL higher than patient's LDL goal (i.e. LDL goal of < 70 mg/dL, would have non-HDL goal of < 100 mg/dL)  . Microalb, Ur 11/04/2020 <0.7  0.0 - 1.9 mg/dL Final  . Creatinine,U 11/04/2020 74.5  mg/dL Final  . Microalb Creat Ratio 11/04/2020 0.9  0.0 - 30.0 mg/g Final  . Sodium 11/04/2020 135  135 - 145 mEq/L Final  . Potassium 11/04/2020 4.3  3.5 - 5.1 mEq/L Final  . Chloride 11/04/2020 101  96 - 112 mEq/L Final  . CO2 11/04/2020 27  19 - 32  mEq/L Final  . Glucose, Bld 11/04/2020 139* 70 - 99 mg/dL Final  . BUN 11/04/2020 20  6 - 23 mg/dL Final  . Creatinine, Ser 11/04/2020 1.13  0.40 - 1.50 mg/dL Final  . Total Bilirubin 11/04/2020 0.6  0.2 - 1.2 mg/dL Final  . Alkaline Phosphatase 11/04/2020 53  39 - 117 U/L Final  . AST 11/04/2020 20  0 - 37 U/L Final  . ALT 11/04/2020 27  0 - 53 U/L Final  . Total Protein 11/04/2020 7.2  6.0 - 8.3 g/dL Final  . Albumin 11/04/2020 4.4  3.5 - 5.2 g/dL Final  . GFR 11/04/2020 72.45  >60.00 mL/min Final   Calculated using the CKD-EPI Creatinine Equation (2021)  . Calcium 11/04/2020 9.6  8.4 - 10.5 mg/dL Final  . Hgb A1c MFr Bld 11/04/2020 6.7* 4.6 - 6.5 % Final   Glycemic Control Guidelines for People with Diabetes:Non Diabetic:  <6%Goal of Therapy: <7%Additional Action Suggested:  >8%   . Direct LDL 11/04/2020 160.0  mg/dL Final   Optimal:  <100 mg/dLNear or Above Optimal:  100-129 mg/dLBorderline High:  130-159 mg/dLHigh:  160-189 mg/dLVery High:  >190 mg/dL       Allergies as of 11/06/2020      Reactions   Penicillins Anaphylaxis   Influenza Vac Split Quad    cellulitis   Pneumococcal Vaccines Other (See Comments)   Cellulitis, swelling      Medication List       Accurate as of November 06, 2020  1:13 PM. If you have any questions, ask your nurse or doctor.        allopurinol 100 MG tablet Commonly known as: ZYLOPRIM Take 1 tablet (100 mg total) by mouth 2 (two) times daily as needed. For gout   gabapentin 300 MG capsule Commonly known as: NEURONTIN Take 1 capsule 3 times a day as needed   Insulin Pen Needle 32G X 4 MM Misc Use to inject insulin once daily   Jardiance 25 MG  Tabs tablet Generic drug: empagliflozin TAKE 1 TABLET(25 MG) BY MOUTH DAILY   metFORMIN 500 MG 24 hr tablet Commonly known as: GLUCOPHAGE-XR TAKE 2 TABLETS TWICE A DAY   OneTouch Delica Lancets 99991111 Misc Use to check blood sugar 2 times per day dx code E11.65   OneTouch Ultra test  strip Generic drug: glucose blood USE TO CHECK BLOOD SUGAR TWICE DAILY.   Ozempic (1 MG/DOSE) 4 MG/3ML Sopn Generic drug: Semaglutide (1 MG/DOSE) INJECT 1MG  SUBCUTANEOUSLY EVERY 7 DAYS AS DIRECTED   rosuvastatin 10 MG tablet Commonly known as: CRESTOR Take 1 tablet (10 mg total) by mouth daily.   Tyler Aas FlexTouch 100 UNIT/ML FlexTouch Pen Generic drug: insulin degludec INJECT 0.2ML (20 UNITS     TOTAL) SUBCUTANEOUSLY DAILY       Allergies:  Allergies  Allergen Reactions  . Penicillins Anaphylaxis  . Influenza Vac Split Quad     cellulitis  . Pneumococcal Vaccines Other (See Comments)    Cellulitis, swelling    Past Medical History:  Diagnosis Date  . Allergy   . Bowel obstruction ([place]) 10/2011  . Diabetes mellitus   . Gout   . Hx of adenomatous polyp of colon 01/22/2015  . Hyperlipidemia   . Hypertension   . Umbilical hernia     Past Surgical History:  Procedure Laterality Date  . APPENDECTOMY     1980s  . COLONOSCOPY W/ POLYPECTOMY  01/2015  . LUMBAR SPINE SURGERY  2018   dr Ronnald Ramp    Family History  Problem Relation Age of Onset  . Diabetes Mother   . Hyperlipidemia Father   . Hypertension Father   . Dementia Father   . Colon cancer Neg Hx   . Esophageal cancer Neg Hx   . Stomach cancer Neg Hx   . Rectal cancer Neg Hx     Social History:  reports that he has been smoking cigarettes and cigars. He has a 9.50 pack-year smoking history. He has never used smokeless tobacco. He reports current alcohol use. He reports that he does not use drugs.    Review of Systems      Hyperlipidemia: He has mixed hyperlipidemia with increased LDL and persistently low HDL  He has continued 10 mg Crestor as recommended on his last visit  Previously on Lipitor which caused muscle aches LDL is excellent at 78 now  Also has had low HDL and variable triglycerides  Lab Results  Component Value Date   CHOL 225 (H) 11/04/2020   CHOL 147 04/18/2020   CHOL 168  01/29/2019   Lab Results  Component Value Date   HDL 32.70 (L) 11/04/2020   HDL 29.40 (L) 04/18/2020   HDL 28.10 (L) 01/29/2019   Lab Results  Component Value Date   LDLCALC 78 04/18/2020   LDLCALC 100 (H) 01/29/2019   LDLCALC 85 07/07/2017   Lab Results  Component Value Date   TRIG 223.0 (H) 11/04/2020   TRIG 196.0 (H) 04/18/2020   TRIG 200.0 (H) 01/29/2019   Lab Results  Component Value Date   CHOLHDL 7 11/04/2020   CHOLHDL 5 04/18/2020   CHOLHDL 6 01/29/2019   Lab Results  Component Value Date   LDLDIRECT 160.0 11/04/2020   LDLDIRECT 141.0 09/11/2019   LDLDIRECT 146.0 09/04/2018     Neuropathy:   He has had painful burning sensation in his feet, mainly at night  Symptoms are decreased and mild, still not needing gabapentin    Foot exam was normal in 04/2020  Blood  pressure: Has had variable blood pressure readings in the office, usually better at home   BP Readings from Last 3 Encounters:  11/06/20 134/82  06/24/20 100/70  04/22/20 118/70   Is asking about his erythrocytosis which is mild but hemoglobin is higher than usual, also has had mild leukocytosis without any systemic symptoms apparently.  Has been hesitating to see the hematologist as directed but agreed that he needs to be seen for evaluation and followed up annually at least  LABS:   Lab on 11/04/2020  Component Date Value Ref Range Status  . Cholesterol 11/04/2020 225* 0 - 200 mg/dL Final   ATP III Classification       Desirable:  < 200 mg/dL               Borderline High:  200 - 239 mg/dL          High:  > = 240 mg/dL  . Triglycerides 11/04/2020 223.0* 0.0 - 149.0 mg/dL Final   Normal:  <150 mg/dLBorderline High:  150 - 199 mg/dL  . HDL 11/04/2020 32.70* >39.00 mg/dL Final  . VLDL 11/04/2020 44.6* 0.0 - 40.0 mg/dL Final  . Total CHOL/HDL Ratio 11/04/2020 7   Final                  Men          Women1/2 Average Risk     3.4          3.3Average Risk          5.0          4.42X Average Risk           9.6          7.13X Average Risk          15.0          11.0                      . NonHDL 11/04/2020 192.01   Final   NOTE:  Non-HDL goal should be 30 mg/dL higher than patient's LDL goal (i.e. LDL goal of < 70 mg/dL, would have non-HDL goal of < 100 mg/dL)  . Microalb, Ur 11/04/2020 <0.7  0.0 - 1.9 mg/dL Final  . Creatinine,U 11/04/2020 74.5  mg/dL Final  . Microalb Creat Ratio 11/04/2020 0.9  0.0 - 30.0 mg/g Final  . Sodium 11/04/2020 135  135 - 145 mEq/L Final  . Potassium 11/04/2020 4.3  3.5 - 5.1 mEq/L Final  . Chloride 11/04/2020 101  96 - 112 mEq/L Final  . CO2 11/04/2020 27  19 - 32 mEq/L Final  . Glucose, Bld 11/04/2020 139* 70 - 99 mg/dL Final  . BUN 11/04/2020 20  6 - 23 mg/dL Final  . Creatinine, Ser 11/04/2020 1.13  0.40 - 1.50 mg/dL Final  . Total Bilirubin 11/04/2020 0.6  0.2 - 1.2 mg/dL Final  . Alkaline Phosphatase 11/04/2020 53  39 - 117 U/L Final  . AST 11/04/2020 20  0 - 37 U/L Final  . ALT 11/04/2020 27  0 - 53 U/L Final  . Total Protein 11/04/2020 7.2  6.0 - 8.3 g/dL Final  . Albumin 11/04/2020 4.4  3.5 - 5.2 g/dL Final  . GFR 11/04/2020 72.45  >60.00 mL/min Final   Calculated using the CKD-EPI Creatinine Equation (2021)  . Calcium 11/04/2020 9.6  8.4 - 10.5 mg/dL Final  . Hgb A1c MFr Bld 11/04/2020 6.7* 4.6 - 6.5 % Final  Glycemic Control Guidelines for People with Diabetes:Non Diabetic:  <6%Goal of Therapy: <7%Additional Action Suggested:  >8%   . Direct LDL 11/04/2020 160.0  mg/dL Final   Optimal:  <100 mg/dLNear or Above Optimal:  100-129 mg/dLBorderline High:  130-159 mg/dLHigh:  160-189 mg/dLVery High:  >190 mg/dL    Physical Examination:  BP 134/82   Pulse 89   Ht 5\' 10"  (1.778 m)   Wt 279 lb (126.6 kg)   SpO2 98%   BMI 40.03 kg/m     ASSESSMENT/PLAN:  Diabetes type 2 with obesity   See history of present illness for detailed discussion of current diabetes management, blood sugar patterns and problems identified  He is on a regimen of  basal insulin, Jardiance, Metformin and Ozempic  His A1c is still fairly good at 6.7  However with recent weight gain, decreased exercise and inconsistent diet his blood sugars are trending higher Unable to review of monitor from home meter again Discussed needing to check blood sugars after meals at night He will try to have consistent diet and exercise regimen and let us know if his blood sugars are not consistently controlled  Urine microalbumin normal  LIPIDS: Previously had better control with taking Crestor LDL and triglycerides are higher with both not taking the medication as well as poor diet  Discussed importance of cutting back on carbohydrate, high-fat foods and weight loss for triglyceride control otherwise considered fenofibrate also He will renew his Crestor now  He will request reports of his eye exam   Follow-up in 4 months  There are no Patient Instructions on file for this visit.   Elayne Snare 11/06/2020, 1:13 PM

## 2020-11-06 NOTE — Patient Instructions (Addendum)
Check blood sugars on waking up 2-3 days a week  Also check blood sugars about 2 hours after meals and do this after different meals by rotation  Recommended blood sugar levels on waking up are 90-130 and about 2 hours after meal is 130-160  Please bring your blood sugar monitor to each visit, thank you  Exercise !  

## 2020-11-10 ENCOUNTER — Other Ambulatory Visit: Payer: Self-pay | Admitting: Endocrinology

## 2020-11-17 ENCOUNTER — Encounter: Payer: Self-pay | Admitting: Endocrinology

## 2020-11-21 ENCOUNTER — Other Ambulatory Visit: Payer: Self-pay | Admitting: Endocrinology

## 2020-12-11 ENCOUNTER — Encounter: Payer: Self-pay | Admitting: Family Medicine

## 2020-12-11 ENCOUNTER — Ambulatory Visit: Payer: BC Managed Care – PPO | Admitting: Family Medicine

## 2020-12-11 ENCOUNTER — Other Ambulatory Visit: Payer: Self-pay

## 2020-12-11 VITALS — BP 110/72 | HR 109 | Temp 98.3°F | Resp 18 | Ht 70.0 in | Wt 277.6 lb

## 2020-12-11 DIAGNOSIS — R140 Abdominal distension (gaseous): Secondary | ICD-10-CM

## 2020-12-11 DIAGNOSIS — R1013 Epigastric pain: Secondary | ICD-10-CM | POA: Insufficient documentation

## 2020-12-11 DIAGNOSIS — R1031 Right lower quadrant pain: Secondary | ICD-10-CM

## 2020-12-11 DIAGNOSIS — K625 Hemorrhage of anus and rectum: Secondary | ICD-10-CM | POA: Diagnosis not present

## 2020-12-11 LAB — POC HEMOCCULT BLD/STL (OFFICE/1-CARD/DIAGNOSTIC)
Card #1 Date: 31022
Fecal Occult Blood, POC: POSITIVE — AB

## 2020-12-11 MED ORDER — PANTOPRAZOLE SODIUM 40 MG PO TBEC: 40.0000 mg | DELAYED_RELEASE_TABLET | Freq: Every day | ORAL | 3 refills | Status: DC

## 2020-12-11 NOTE — Assessment & Plan Note (Signed)
Ct abd pelvis Check labs  If pain worsens--- go to ER

## 2020-12-11 NOTE — Assessment & Plan Note (Signed)
protonix qd  Avoid spicy acidic foods, peppermint, caffeine etc Refer GI

## 2020-12-11 NOTE — Progress Notes (Signed)
Patient ID: Greg Kane, male    DOB: 02-Sep-1966  Age: 55 y.o. MRN: [id number]    Subjective:  Subjective  HPI Greg Kane presents for rectal bleed since Saturday with abd bloating / pain.  He has not taken anything otc.    Review of Systems  Constitutional: Negative for appetite change, diaphoresis, fatigue and unexpected weight change.  Eyes: Negative for pain, redness and visual disturbance.  Respiratory: Negative for cough, chest tightness, shortness of breath and wheezing.   Cardiovascular: Negative for chest pain, palpitations and leg swelling.  Gastrointestinal: Positive for abdominal distention, abdominal pain, anal bleeding and blood in stool. Negative for constipation, diarrhea, nausea, rectal pain and vomiting.  Endocrine: Negative for cold intolerance, heat intolerance, polydipsia, polyphagia and polyuria.  Genitourinary: Negative for difficulty urinating, dysuria and frequency.  Neurological: Negative for dizziness, light-headedness, numbness and headaches.    History Past Medical History:  Diagnosis Date  . Allergy   . Bowel obstruction ([place]) 10/2011  . Diabetes mellitus   . Gout   . Hx of adenomatous polyp of colon 01/22/2015  . Hyperlipidemia   . Hypertension   . Umbilical hernia     He has a past surgical history that includes Appendectomy; Colonoscopy w/ polypectomy (01/2015); and Lumbar spine surgery (2018).   His family history includes Dementia in his father; Diabetes in his mother; Hyperlipidemia in his father; Hypertension in his father.He reports that he has been smoking cigarettes and cigars. He has a 9.50 pack-year smoking history. He has never used smokeless tobacco. He reports current alcohol use. He reports that he does not use drugs.  Current Outpatient Medications on File Prior to Visit  Medication Sig Dispense Refill  . allopurinol (ZYLOPRIM) 100 MG tablet Take 1 tablet (100 mg total) by mouth 2 (two) times daily as needed. For gout 180  tablet 1  . gabapentin (NEURONTIN) 300 MG capsule Take 1 capsule 3 times a day as needed 270 capsule 1  . Insulin Pen Needle 32G X 4 MM MISC Use to inject insulin once daily 90 each 1  . JARDIANCE 25 MG TABS tablet TAKE 1 TABLET(25 MG) BY MOUTH DAILY 30 tablet 3  . metFORMIN (GLUCOPHAGE-XR) 500 MG 24 hr tablet TAKE 2 TABLETS TWICE A DAY 360 tablet 1  . ONETOUCH DELICA LANCETS 75Z MISC Use to check blood sugar 2 times per day dx code E11.65 200 each 3  . ONETOUCH ULTRA test strip USE TO CHECK BLOOD SUGAR TWICE DAILY. 100 strip 3  . rosuvastatin (CRESTOR) 10 MG tablet TAKE 1 TABLET DAILY 90 tablet 1  . Semaglutide, 1 MG/DOSE, (OZEMPIC, 1 MG/DOSE,) 4 MG/3ML SOPN INJECT 1MG  SUBCUTANEOUSLY EVERY 7 DAYS AS DIRECTED 12 mL 1  . TRESIBA FLEXTOUCH 100 UNIT/ML FlexTouch Pen INJECT 0.2ML (20 UNITS     TOTAL) SUBCUTANEOUSLY DAILY 30 mL 1   No current facility-administered medications on file prior to visit.     Objective:  Objective  Physical Exam Vitals reviewed.  Constitutional:      General: He is sleeping.     Appearance: He is well-developed.  HENT:     Head: Normocephalic and atraumatic.  Eyes:     Pupils: Pupils are equal, round, and reactive to light.  Neck:     Thyroid: No thyromegaly.  Cardiovascular:     Rate and Rhythm: Normal rate and regular rhythm.     Heart sounds: No murmur heard.   Pulmonary:     Effort: Pulmonary effort is normal. No  respiratory distress.     Breath sounds: Normal breath sounds. No wheezing or rales.  Chest:     Chest wall: No tenderness.  Abdominal:     General: There is distension.     Palpations: There is no mass.     Tenderness: There is abdominal tenderness in the right lower quadrant. There is guarding. There is no right CVA tenderness, left CVA tenderness or rebound.     Hernia: No hernia is present.  Musculoskeletal:        General: No tenderness.     Cervical back: Normal range of motion and neck supple.  Skin:    General: Skin is warm and  dry.  Neurological:     Mental Status: He is oriented to person, place, and time.  Psychiatric:        Behavior: Behavior normal.        Thought Content: Thought content normal.        Judgment: Judgment normal.    BP 110/72 (BP Location: Right Arm, Patient Position: Sitting, Cuff Size: Large)   Pulse (!) 109   Temp 98.3 F (36.8 C) (Oral)   Resp 18   Ht 5\' 10"  (1.778 m)   Wt 277 lb 9.6 oz (125.9 kg)   SpO2 95%   BMI 39.83 kg/m  Wt Readings from Last 3 Encounters:  12/11/20 277 lb 9.6 oz (125.9 kg)  11/06/20 279 lb (126.6 kg)  06/24/20 267 lb 12.8 oz (121.5 kg)     Lab Results  Component Value Date   WBC 10.9 (H) 06/24/2020   HGB 18.2 (H) 06/24/2020   HCT 52.2 (H) 06/24/2020   PLT 267 06/24/2020   GLUCOSE 139 (H) 11/04/2020   CHOL 225 (H) 11/04/2020   TRIG 223.0 (H) 11/04/2020   HDL 32.70 (L) 11/04/2020   LDLDIRECT 160.0 11/04/2020   LDLCALC 78 04/18/2020   ALT 27 11/04/2020   AST 20 11/04/2020   NA 135 11/04/2020   K 4.3 11/04/2020   CL 101 11/04/2020   CREATININE 1.13 11/04/2020   BUN 20 11/04/2020   CO2 27 11/04/2020   TSH 1.88 06/24/2020   PSA 1.64 06/24/2020   HGBA1C 6.7 (H) 11/04/2020   MICROALBUR <0.7 11/04/2020    MR Lumbar Spine Wo Contrast  Result Date: 07/10/2015 CLINICAL DATA:  Acute right-sided back pain. Right leg radiculopathy EXAM: MRI LUMBAR SPINE WITHOUT CONTRAST TECHNIQUE: Multiplanar, multisequence MR imaging of the lumbar spine was performed. No intravenous contrast was administered. COMPARISON:  Lumbar MRI 06/25/2008 FINDINGS: Normal lumbar alignment. Negative for fracture or mass. No bone marrow edema. Conus medullaris is normal and terminates at L1-2. T12-L1:  Mild disc degeneration L1-2:  Mild disc and facet degeneration without spinal stenosis. L2-3:  Normal disc.  Mild facet degeneration. L3-4: Mild disc degeneration and disc bulging with mild facet degeneration. Mild spinal stenosis unchanged L4-5: Disc degeneration with disc bulging  and associated endplate spurring. Small extruded disc fragment on the right extending caudally is unchanged from the prior study. This may be causing compression of the right L5 nerve root in the subarticular zone. Facet hypertrophy and mild foraminal stenosis bilaterally L5-S1: Central and left-sided disc protrusion and spurring . Progressive facet hypertrophy on the left. Progression of moderate left foraminal encroachment. IMPRESSION: Mild disc degeneration and facet degeneration at L1-2, L2-3 and L3-4. Mild spinal stenosis L3-4 Disc degeneration and spurring at L4-5 with mild spinal stenosis. Small extruded disc fragment on the right is unchanged and may be causing right L5  nerve root impingement Progression of left foraminal encroachment L5-S1 due to vertebral and facet spurring. Electronically Signed   By: Franchot Gallo M.D.   On: 07/10/2015 19:14     Assessment & Plan:  Plan  I am having Greg Kane start on pantoprazole. I am also having him maintain his gabapentin, OneTouch Delica Lancets 65V, Insulin Pen Needle, OneTouch Ultra, allopurinol, Tresiba FlexTouch, metFORMIN, Ozempic (1 MG/DOSE), rosuvastatin, and Jardiance.  Meds ordered this encounter  Medications  . pantoprazole (PROTONIX) 40 MG tablet    Sig: Take 1 tablet (40 mg total) by mouth daily.    Dispense:  30 tablet    Refill:  3    Problem List Items Addressed This Visit      Unprioritized   Abdominal bloating    Check labs  Ct abd       Relevant Orders   POCT Urinalysis Dipstick (Automated)   Dyspepsia    protonix qd  Avoid spicy acidic foods, peppermint, caffeine etc Refer GI      Relevant Medications   pantoprazole (PROTONIX) 40 MG tablet   Other Relevant Orders   TSH   Comprehensive metabolic panel   CBC with Differential/Platelet   Rectal bleeding - Primary   Relevant Orders   POC Hemoccult Bld/Stl (1-Cd Office Dx) (Completed)   Ambulatory referral to Gastroenterology   TSH   Comprehensive  metabolic panel   CBC with Differential/Platelet   Right lower quadrant abdominal pain    Ct abd pelvis Check labs  If pain worsens--- go to ER      Relevant Orders   CT Abdomen Pelvis W Contrast      Follow-up: Return if symptoms worsen or fail to improve.  Ann Held, DO

## 2020-12-11 NOTE — Assessment & Plan Note (Signed)
Check labs  Ct abd

## 2020-12-11 NOTE — Patient Instructions (Signed)
[email or web address]">  Rectal Bleeding  Rectal bleeding is when blood passes out of the opening between the buttocks (anus). People with rectal bleeding may notice bright red blood in their underwear or in the toilet after having a bowel movement. They may also have blood mixed with their stool (feces), or dark red or black stools. Rectal bleeding is usually a sign that something is wrong. Many things can cause rectal bleeding, including:  Diverticulosis. This is a condition in which pockets or sacs project from the bowel.  Hemorrhoids. These are blood vessels around the anus or inside the rectum that are larger than normal.  Anal fissures. This is a tear in the anus.  Proctitis and colitis. These are conditions in which the rectum, colon, or anus become inflamed.  Polyps. These are growths that can be cancerous (malignant) or noncancerous (benign).  Infections of the intestines.  Fistulas. These are abnormal openings in the rectum and anus.  Rectal prolapse. This is when a part of the rectum sticks out from the anus. Follow these instructions at home: Pay attention to any changes in your symptoms. Take these actions to help reduce bleeding and discomfort: Medicines  Take over-the-counter and prescription medicines only as told by your health care provider.  Ask your health care provider about changing or stopping your regular medicines or supplements. This is especially important if you are taking blood thinners. Medicines that thin the blood can make rectal bleeding worse. Managing constipation Your condition may cause constipation. To prevent or treat constipation, or to help make your stools soft, you may need to:  Drink enough fluid to keep your urine pale yellow.  Take over-the-counter or prescription medicines.  Eat foods that are high in fiber, such as beans, whole grains, and fresh  fruits and vegetables. Ask your health care provider if you need a supplement to give you more fiber.  Limit foods that are high in fat and processed sugars, such as fried or sweet foods.  General instructions  Try not to strain when having a bowel movement.  Try taking a warm bath. This may help to soothe any pain in your rectum.  Keep all follow-up visits as told by your health care provider. This is important. Contact a health care provider if you:  Have pain or tenderness in your abdomen.  Have a fever.  Have weakness.  Have nausea.  Cannot have a bowel movement. Get help right away if you have:  New or increased rectal bleeding.  Black or dark red stools.  Vomit with blood or something that looks like coffee grounds.  A fainting episode.  Severe pain in your rectum. Summary  Rectal bleeding is usually a sign that something is wrong. This condition should be evaluated by a health care provider.  Eat a diet that is high in fiber. This will help keep your stools soft, making it easier to pass stools without straining.  Medicines that thin the blood can make rectal bleeding worse.  Get help right away if you have new or increased rectal bleeding, black or dark red stools, blood in your vomit, an episode of fainting, or severe pain in your rectum. This information is not intended to replace advice given to you by your health care provider. Make sure you discuss any questions you have with your health care provider. Document Revised: 08/22/2019 Document Reviewed: 08/22/2019 Elsevier Patient Education  2021 Elsevier Inc.  

## 2020-12-12 ENCOUNTER — Ambulatory Visit (HOSPITAL_BASED_OUTPATIENT_CLINIC_OR_DEPARTMENT_OTHER)
Admission: RE | Admit: 2020-12-12 | Discharge: 2020-12-12 | Disposition: A | Discharge status: Home / Self Care | Payer: BC Managed Care – PPO | Source: Ambulatory Visit | Attending: Family Medicine | Admitting: Family Medicine

## 2020-12-12 DIAGNOSIS — R1031 Right lower quadrant pain: Secondary | ICD-10-CM | POA: Diagnosis present

## 2020-12-12 LAB — CBC WITH DIFFERENTIAL/PLATELET
Basophils Absolute: 0 10*3/uL (ref 0.0–0.1)
Basophils Relative: 0.4 % (ref 0.0–3.0)
Eosinophils Absolute: 0.2 10*3/uL (ref 0.0–0.7)
Eosinophils Relative: 1.9 % (ref 0.0–5.0)
HCT: 52.5 % — ABNORMAL HIGH (ref 39.0–52.0)
Hemoglobin: 17.8 g/dL — ABNORMAL HIGH (ref 13.0–17.0)
Lymphocytes Relative: 23.2 % (ref 12.0–46.0)
Lymphs Abs: 2.7 10*3/uL (ref 0.7–4.0)
MCHC: 34 g/dL (ref 30.0–36.0)
MCV: 88.7 fl (ref 78.0–100.0)
Monocytes Absolute: 0.8 10*3/uL (ref 0.1–1.0)
Monocytes Relative: 6.8 % (ref 3.0–12.0)
Neutro Abs: 7.9 10*3/uL — ABNORMAL HIGH (ref 1.4–7.7)
Neutrophils Relative %: 67.7 % (ref 43.0–77.0)
Platelets: 226 10*3/uL (ref 150.0–400.0)
RBC: 5.92 Mil/uL — ABNORMAL HIGH (ref 4.22–5.81)
RDW: 13.7 % (ref 11.5–15.5)
WBC: 11.6 10*3/uL — ABNORMAL HIGH (ref 4.0–10.5)

## 2020-12-12 LAB — COMPREHENSIVE METABOLIC PANEL
ALT: 34 U/L (ref 0–53)
AST: 25 U/L (ref 0–37)
Albumin: 4.4 g/dL (ref 3.5–5.2)
Alkaline Phosphatase: 55 U/L (ref 39–117)
BUN: 17 mg/dL (ref 6–23)
CO2: 25 mEq/L (ref 19–32)
Calcium: 9.6 mg/dL (ref 8.4–10.5)
Chloride: 104 mEq/L (ref 96–112)
Creatinine, Ser: 1.12 mg/dL (ref 0.40–1.50)
GFR: 73.17 mL/min (ref >60.00)
Glucose, Bld: 105 mg/dL — ABNORMAL HIGH (ref 70–99)
Potassium: 4.1 mEq/L (ref 3.5–5.1)
Sodium: 139 mEq/L (ref 135–145)
Total Bilirubin: 0.6 mg/dL (ref 0.2–1.2)
Total Protein: 7.2 g/dL (ref 6.0–8.3)

## 2020-12-12 LAB — TSH: TSH: 1.5 u[IU]/mL (ref 0.35–4.50)

## 2020-12-12 MED ORDER — IOHEXOL 300 MG/ML  SOLN
100.0000 mL | Freq: Once | INTRAMUSCULAR | Status: AC | PRN
  Administered 2020-12-12: 100 mL via INTRAVENOUS

## 2020-12-17 ENCOUNTER — Encounter: Payer: Self-pay | Admitting: Nurse Practitioner

## 2020-12-17 ENCOUNTER — Encounter: Payer: Self-pay | Admitting: Family Medicine

## 2020-12-17 NOTE — Telephone Encounter (Signed)
GI doctor not seeing pt until March 30th. Pt is concerned for the wait time. See below

## 2020-12-18 NOTE — Telephone Encounter (Signed)
I am not concerned unless pain or bleeding is worse

## 2020-12-31 ENCOUNTER — Other Ambulatory Visit: Payer: Self-pay

## 2020-12-31 ENCOUNTER — Ambulatory Visit: Payer: BC Managed Care – PPO | Admitting: Nurse Practitioner

## 2020-12-31 ENCOUNTER — Encounter: Payer: Self-pay | Admitting: Nurse Practitioner

## 2020-12-31 VITALS — BP 120/70 | HR 84 | Ht 70.0 in | Wt 278.0 lb

## 2020-12-31 DIAGNOSIS — R140 Abdominal distension (gaseous): Secondary | ICD-10-CM | POA: Diagnosis not present

## 2020-12-31 DIAGNOSIS — K625 Hemorrhage of anus and rectum: Secondary | ICD-10-CM | POA: Diagnosis not present

## 2020-12-31 DIAGNOSIS — Z86010 Personal history of colonic polyps: Secondary | ICD-10-CM | POA: Diagnosis not present

## 2020-12-31 NOTE — Progress Notes (Signed)
ASSESSMENT AND PLAN     # 55 yo male with painless rectal bleeding with bowel movements over the last 4 weeks.  CT scan for evaluation of bleeding was unremarkable.. Bleeding most likely hemorrhoidal but need to rule out polyps or other etiology --Patient will be scheduled for a colonoscopy. The risks and benefits of colonoscopy with possible polypectomy / biopsies were discussed and the patient agrees to proceed.   # Bloating / gas with belching and flatus. Improved with recent initiation of PPI by PCP.   --Not sure exactly why the PPI has helped but since it has will continue it for now.  --Advised to keep food diary to look for culprit foods  --At some point consider evaluation / treatment of SIBO ( diabetes is risk factor) if no improvement  # Hepatomegaly / fatty liver, incidental findings on CT scan. Liver (21cm).  Liver chemistries are normal.  Fatty liver briefly discussed with patient --Good glycemic control and weight loss will be important in management of fatty liver disease --He rarely consumes alcohol which is in his favor   HISTORY OF PRESENT ILLNESS     Chief Complaint : abdominal cramping, rectal bleeding  Greg Kane is a 55 y.o. male known to Dr Carlean Purl with a past medical history significant for adenomatous colon polyps, diverticulosis, diabetes, obesity.    Patient referred by PCP for rectal bleeding. Het has been having painless rectal bleeding with BMs everyday for 4 weeks. He hasn't been bothered by hemorrhoids lately and generally hemorrhoidal bleeding has not lasted this long. . Denies constipation at present.  He does have a history of constipation and occasionally takes Dulcolax and Miralax. He hasn't needed either medications lately.  Blood bright red on tissue and also on the stool.  BMs are otherwise normal in frequency and consistency. Since the bleeding started he has been having mild abdominal bloating which is random rather than postprandial.   Bloating relieved with belching and flatus he does consume a lot of high fiber foods. He has been avoiding diary for two years due to arthritis.   Saw PCP on 3/11 . WBC 11.6 (chronically elevated). Hgb was 17.8 . Sent for CT scan which was negative for any acute findings. PCP started him on a PPI and his symptoms bloating / gas have significantly improved .   PREVIOUS EVALUATIONS:   April 2016 screening colonoscopy --Mild diverticulosis --5 mm sessile polyp (tubular adenoma) -- Past Medical History:  Diagnosis Date  . Allergy   . Bowel obstruction ([facility]) 10/2011  . Diabetes mellitus   . Gout   . Hx of adenomatous polyp of colon 01/22/2015  . Hyperlipidemia   . Hypertension   . Umbilical hernia      Past Surgical History:  Procedure Laterality Date  . APPENDECTOMY     1980s  . COLONOSCOPY W/ POLYPECTOMY  01/2015  . LUMBAR SPINE SURGERY  2018   dr Ronnald Ramp   Family History  Problem Relation Age of Onset  . Diabetes Mother   . Hyperlipidemia Father   . Hypertension Father   . Dementia Father   . Colon cancer Neg Hx   . Esophageal cancer Neg Hx   . Stomach cancer Neg Hx   . Rectal cancer Neg Hx    Social History   Tobacco Use  . Smoking status: Current Some Day Smoker    Packs/day: 0.25    Years: 38.00    Pack years: 9.50    Types:  Cigarettes, Cigars    Last attempt to quit: 10/12/2008    Years since quitting: 12.2  . Smokeless tobacco: Never Used  . Tobacco comment: pt doesn't buy cig-- borrows from others no more than 4 cig a week  Substance Use Topics  . Alcohol use: Yes    Alcohol/week: 0.0 standard drinks    Comment: occas  . Drug use: No   Current Outpatient Medications  Medication Sig Dispense Refill  . allopurinol (ZYLOPRIM) 100 MG tablet Take 1 tablet (100 mg total) by mouth 2 (two) times daily as needed. For gout 180 tablet 1  . gabapentin (NEURONTIN) 300 MG capsule Take 1 capsule 3 times a day as needed 270 capsule 1  . Insulin Pen Needle 32G X 4 MM MISC  Use to inject insulin once daily 90 each 1  . JARDIANCE 25 MG TABS tablet TAKE 1 TABLET(25 MG) BY MOUTH DAILY 30 tablet 3  . metFORMIN (GLUCOPHAGE-XR) 500 MG 24 hr tablet TAKE 2 TABLETS TWICE A DAY 360 tablet 1  . ONETOUCH DELICA LANCETS 62B MISC Use to check blood sugar 2 times per day dx code E11.65 200 each 3  . ONETOUCH ULTRA test strip USE TO CHECK BLOOD SUGAR TWICE DAILY. 100 strip 3  . pantoprazole (PROTONIX) 40 MG tablet Take 1 tablet (40 mg total) by mouth daily. 30 tablet 3  . rosuvastatin (CRESTOR) 10 MG tablet TAKE 1 TABLET DAILY 90 tablet 1  . Semaglutide, 1 MG/DOSE, (OZEMPIC, 1 MG/DOSE,) 4 MG/3ML SOPN INJECT 1MG  SUBCUTANEOUSLY EVERY 7 DAYS AS DIRECTED 12 mL 1  . TRESIBA FLEXTOUCH 100 UNIT/ML FlexTouch Pen INJECT 0.2ML (20 UNITS     TOTAL) SUBCUTANEOUSLY DAILY 30 mL 1   No current facility-administered medications for this visit.   Allergies  Allergen Reactions  . Penicillins Anaphylaxis  . Influenza Vac Split Quad     cellulitis  . Pneumococcal Vaccines Other (See Comments)    Cellulitis, swelling     Review of Systems: Positive for headaches, muscle pain and cramps.  All other systems reviewed and negative except where noted in HPI.   PHYSICAL EXAM :    Wt Readings from Last 3 Encounters:  12/31/20 278 lb (126.1 kg)  12/11/20 277 lb 9.6 oz (125.9 kg)  11/06/20 279 lb (126.6 kg)    BP 120/70   Pulse 84   Ht 5\' 10"  (1.778 m)   Wt 278 lb (126.1 kg)   SpO2 97%   BMI 39.89 kg/m  Constitutional:  Pleasant obese male in no acute distress. Psychiatric: Normal mood and affect. Behavior is normal. EENT: Pupils normal.  Conjunctivae are normal. No scleral icterus. Neck supple.  Cardiovascular: Normal rate, regular rhythm. No edema Pulmonary/chest: Effort normal and breath sounds normal. No wheezing, rales or rhonchi. Abdominal: Soft, nondistended, mild epigastric tenderness.  Umbilical hernia present.  bowel sounds active throughout. There are no masses palpable. No  hepatomegaly. Neurological: Alert and oriented to person place and time. Skin: Skin is warm and dry. No rashes noted.  Tye Savoy, NP  12/31/2020, 8:40 AM  Cc:  Referring Provider Carollee Herter, Alferd Apa, *

## 2020-12-31 NOTE — Patient Instructions (Signed)
If you are age 57 or older, your body mass index should be between 23-30. Your Body mass index is 39.89 kg/m. If this is out of the aforementioned range listed, please consider follow up with your Primary Care Provider.  If you are age 62 or younger, your body mass index should be between 19-25. Your Body mass index is 39.89 kg/m. If this is out of the aformentioned range listed, please consider follow up with your Primary Care Provider.   You have been scheduled for a colonoscopy. Please follow written instructions given to you at your visit today.  Please pick up your prep supplies at the pharmacy within the next 1-3 days. If you use inhalers (even only as needed), please bring them with you on the day of your procedure.  Due to recent changes in healthcare laws, you may see the results of your imaging and laboratory studies on MyChart before your provider has had a chance to review them.  We understand that in some cases there may be results that are confusing or concerning to you. Not all laboratory results come back in the same time frame and the provider may be waiting for multiple results in order to interpret others.  Please give [place] 48 hours in order for your provider to thoroughly review all the results before contacting the office for clarification of your results.

## 2021-01-12 ENCOUNTER — Other Ambulatory Visit: Payer: Self-pay | Admitting: Endocrinology

## 2021-02-12 ENCOUNTER — Other Ambulatory Visit: Payer: Self-pay

## 2021-02-12 ENCOUNTER — Ambulatory Visit (AMBULATORY_SURGERY_CENTER): Payer: BC Managed Care – PPO | Admitting: Internal Medicine

## 2021-02-12 ENCOUNTER — Encounter: Payer: Self-pay | Admitting: Internal Medicine

## 2021-02-12 VITALS — BP 101/74 | HR 80 | Temp 98.0°F | Resp 17 | Ht 70.0 in | Wt 278.0 lb

## 2021-02-12 DIAGNOSIS — K5730 Diverticulosis of large intestine without perforation or abscess without bleeding: Secondary | ICD-10-CM | POA: Diagnosis not present

## 2021-02-12 DIAGNOSIS — D123 Benign neoplasm of transverse colon: Secondary | ICD-10-CM | POA: Diagnosis not present

## 2021-02-12 DIAGNOSIS — K625 Hemorrhage of anus and rectum: Secondary | ICD-10-CM

## 2021-02-12 DIAGNOSIS — K648 Other hemorrhoids: Secondary | ICD-10-CM

## 2021-02-12 MED ORDER — SODIUM CHLORIDE 0.9 % IV SOLN: 500.0000 mL | Freq: Once | INTRAVENOUS | Status: AC

## 2021-02-12 NOTE — Patient Instructions (Addendum)
I found one polyp and removed it. This was not bleeding.  You have hemorrhoids and also have diverticulosis - one of these caused the bleeding.  I will let you know pathology results and when to have another routine colonoscopy by mail and/or My Chart.  I am providing some information about changing eating for you. It is imperative to try to reduce insulin resistance by changing eating. I know we have not discussed but the fatty liver + diabetes and weight portend a future of poor health. For many of [place] we have gotten this way by eating what "guidelines" have told us. However, low fat eating has proven to be wrong and was associated with high carb eating and weeight gain and all of the problems you have.  My advice is to try a low carb diet. Would also consider time-restricted eating and even intermittent fasting. At the least a Mediterranean diet.  Changing eating can lead to reversal of diabetes and fatty liver and improve your health.  Take time to try to understand and be patient as this is not easy due to established habits and even addiction-like issues to carbs and sugar.  Thanks for listening and feel free to schedule a follow-up to review more.  I appreciate the opportunity to care for you. Gatha Mayer, MD, [facility]    Hemorrhoid, polyp, and diverticulosis handouts given to patient.  YOU HAD AN ENDOSCOPIC PROCEDURE TODAY AT [facility]:   Refer to the procedure report that was given to you for any specific questions about what was found during the examination.  If the procedure report does not answer your questions, please call your gastroenterologist to clarify.  If you requested that your care partner not be given the details of your procedure findings, then the procedure report has been included in a sealed envelope for you to review at your convenience later.  YOU SHOULD EXPECT: Some feelings of bloating in the abdomen. Passage of more gas than usual.  Walking  can help get rid of the air that was put into your GI tract during the procedure and reduce the bloating. If you had a lower endoscopy (such as a colonoscopy or flexible sigmoidoscopy) you may notice spotting of blood in your stool or on the toilet paper. If you underwent a bowel prep for your procedure, you may not have a normal bowel movement for a few days.  Please Note:  You might notice some irritation and congestion in your nose or some drainage.  This is from the oxygen used during your procedure.  There is no need for concern and it should clear up in a day or so.  SYMPTOMS TO REPORT IMMEDIATELY:   Following lower endoscopy (colonoscopy or flexible sigmoidoscopy):  Excessive amounts of blood in the stool  Significant tenderness or worsening of abdominal pains  Swelling of the abdomen that is new, acute  Fever of 100F or higher For urgent or emergent issues, a gastroenterologist can be reached at any hour by calling [phone]. Do not use MyChart messaging for urgent concerns.    DIET:  We do recommend a small meal at first, but then you may proceed to your regular diet.  Drink plenty of fluids but you should avoid alcoholic beverages for 24 hours.  ACTIVITY:  You should plan to take it easy for the rest of today and you should NOT DRIVE or use heavy machinery until tomorrow (because of the sedation medicines used during the test).  FOLLOW UP: Our staff will call the number listed on your records 48-72 hours following your procedure to check on you and address any questions or concerns that you may have regarding the information given to you following your procedure. If we do not reach you, we will leave a message.  We will attempt to reach you two times.  During this call, we will ask if you have developed any symptoms of COVID 19. If you develop any symptoms (ie: fever, flu-like symptoms, shortness of breath, cough etc.) before then, please call [phone].  If you test  positive for Covid 19 in the 2 weeks post procedure, please call and report this information to [place].    If any biopsies were taken you will be contacted by phone or by letter within the next 1-3 weeks.  Please call us at [phone] if you have not heard about the biopsies in 3 weeks.    SIGNATURES/CONFIDENTIALITY: You and/or your care partner have signed paperwork which will be entered into your electronic medical record.  These signatures attest to the fact that that the information above on your After Visit Summary has been reviewed and is understood.  Full responsibility of the confidentiality of this discharge information lies with you and/or your care-partner.

## 2021-02-12 NOTE — Progress Notes (Signed)
Report to PACU, RN, vss, BBS= Clear.  

## 2021-02-12 NOTE — Progress Notes (Signed)
Called to room to assist during endoscopic procedure.  Patient ID and intended procedure confirmed with present staff. Received instructions for my participation in the procedure from the performing physician.  

## 2021-02-12 NOTE — Op Note (Signed)
[facility] Patient Name: Greg Kane Procedure Date: 02/12/2021 3:42 PM MRN: [id number] Endoscopist: Gatha Mayer , MD Age: 55 Referring MD:  Date of Birth: 1966/07/05 Gender: Male Account #: [id number] Procedure:                Colonoscopy Indications:              Rectal bleeding Medicines:                Propofol per Anesthesia, Monitored Anesthesia Care Procedure:                Pre-Anesthesia Assessment:                           - Prior to the procedure, a History and Physical                            was performed, and patient medications and                            allergies were reviewed. The patient's tolerance of                            previous anesthesia was also reviewed. The risks                            and benefits of the procedure and the sedation                            options and risks were discussed with the patient.                            All questions were answered, and informed consent                            was obtained. Prior Anticoagulants: The patient has                            taken no previous anticoagulant or antiplatelet                            agents. ASA Grade Assessment: II - A patient with                            mild systemic disease. After reviewing the risks                            and benefits, the patient was deemed in                            satisfactory condition to undergo the procedure.                           After obtaining informed consent, the colonoscope  was passed under direct vision. Throughout the                            procedure, the patient's blood pressure, pulse, and                            oxygen saturations were monitored continuously. The                            Olympus CF-HQ190L [id number]) Colonoscope was                            introduced through the anus and advanced to the the                            cecum, identified by  appendiceal orifice and                            ileocecal valve. The colonoscopy was somewhat                            difficult due to significant looping. Successful                            completion of the procedure was aided by applying                            abdominal pressure. The patient tolerated the                            procedure well. The quality of the bowel                            preparation was adequate to identify polyps 6 mm                            and larger in size. The ileocecal valve,                            appendiceal orifice, and rectum were photographed.                            The bowel preparation used was Miralax via split                            dose instruction. Scope In: 3:56:37 PM Scope Out: 4:23:36 PM Scope Withdrawal Time: 0 hours 22 minutes 37 seconds  Total Procedure Duration: 0 hours 26 minutes 59 seconds  Findings:                 The perianal and digital rectal examinations were                            normal. Pertinent negatives include normal prostate                            (  size, shape, and consistency).                           A 12 mm polyp was found in the transverse colon.                            The polyp was sessile. The polyp was removed with a                            piecemeal technique using a cold snare. Resection                            and retrieval were complete. Verification of                            patient identification for the specimen was done.                            Estimated blood loss was minimal.                           Multiple large-mouthed diverticula were found in                            the sigmoid colon.                           Internal hemorrhoids were found.                           The exam was otherwise without abnormality on                            direct and retroflexion views. Complications:            No immediate complications. Estimated  Blood Loss:     Estimated blood loss was minimal. Impression:               - One 12 mm polyp in the transverse colon, removed                            piecemeal using a cold snare. Resected and                            retrieved.                           - Diverticulosis in the sigmoid colon.                           - Internal hemorrhoids.                           - The examination was otherwise normal on direct  and retroflexion views.                           - Personal history of colonic polyps 5 mm adenoma                            2016. Recommendation:           - Patient has a contact number available for                            emergencies. The signs and symptoms of potential                            delayed complications were discussed with the                            patient. Return to normal activities tomorrow.                            Written discharge instructions were provided to the                            patient.                           - Continue present medications.                           - Repeat colonoscopy is recommended for                            surveillance. The colonoscopy date will be                            determined after pathology results from today's                            exam become available for review.                           Handouts to change eating habits to imoprove                            metabolic health provided - has hepatomegaly and                            fatty livetr, diabets Gatha Mayer, MD 02/12/2021 4:44:38 PM This report has been signed electronically.

## 2021-02-16 ENCOUNTER — Telehealth: Payer: Self-pay

## 2021-02-16 NOTE — Telephone Encounter (Signed)
LVM

## 2021-02-26 ENCOUNTER — Encounter: Payer: Self-pay | Admitting: Internal Medicine

## 2021-03-09 ENCOUNTER — Other Ambulatory Visit (INDEPENDENT_AMBULATORY_CARE_PROVIDER_SITE_OTHER): Payer: BC Managed Care – PPO

## 2021-03-09 ENCOUNTER — Other Ambulatory Visit: Payer: Self-pay

## 2021-03-09 DIAGNOSIS — E1165 Type 2 diabetes mellitus with hyperglycemia: Secondary | ICD-10-CM | POA: Diagnosis not present

## 2021-03-09 DIAGNOSIS — Z794 Long term (current) use of insulin: Secondary | ICD-10-CM

## 2021-03-09 LAB — LIPID PANEL
Cholesterol: 178 mg/dL (ref 0–200)
HDL: 34.4 mg/dL — ABNORMAL LOW (ref >39.00)
LDL Cholesterol: 117 mg/dL — ABNORMAL HIGH (ref 0–99)
NonHDL: 143.47
Total CHOL/HDL Ratio: 5
Triglycerides: 133 mg/dL (ref 0.0–149.0)
VLDL: 26.6 mg/dL (ref 0.0–40.0)

## 2021-03-09 LAB — HEMOGLOBIN A1C: Hgb A1c MFr Bld: 6.8 % — ABNORMAL HIGH (ref 4.6–6.5)

## 2021-03-12 ENCOUNTER — Ambulatory Visit: Payer: BC Managed Care – PPO | Admitting: Endocrinology

## 2021-03-12 ENCOUNTER — Other Ambulatory Visit: Payer: Self-pay

## 2021-03-12 VITALS — BP 118/78 | HR 102 | Ht 70.0 in | Wt 274.5 lb

## 2021-03-12 DIAGNOSIS — Z6839 Body mass index (BMI) 39.0-39.9, adult: Secondary | ICD-10-CM | POA: Diagnosis not present

## 2021-03-12 DIAGNOSIS — E1165 Type 2 diabetes mellitus with hyperglycemia: Secondary | ICD-10-CM | POA: Diagnosis not present

## 2021-03-12 DIAGNOSIS — E782 Mixed hyperlipidemia: Secondary | ICD-10-CM | POA: Diagnosis not present

## 2021-03-12 DIAGNOSIS — Z794 Long term (current) use of insulin: Secondary | ICD-10-CM

## 2021-03-12 NOTE — Progress Notes (Signed)
Patient ID: Greg Kane, male   DOB: [birth year], 55 y.o.   MRN: [id number]    Reason for Appointment : Endocrinology follow up  History of Present Illness          DIABETES: He has type 2 diabetes mellitus, date of diagnosis 2008  Background information: Initial diagnosis was made incidentally and had only mild hyperglycemia. Initially was treated with metformin alone and then Actos was added He has not had any formal diabetes education and has had difficulty losing weight His blood sugars have been only fairly well controlled with A1c usually in the 7-8 range Because of inadequate control in 2013 he was given Victoza injection in addition. However Actos was stopped  He thinks this helped bring his blood sugars better controlled but helped his portion control only slightly He did not lose much weight with Victoza In 6/14 he was given Invokana in addition and his Victoza was resumed.  RECENT history    Treatment regimen: Insulin:  Tresiba 20 units in a.m.; non-insulin drugs: Ozempic 1 mg weekly, Jardiance 25 mg, metformin 2000 mg  A1c is 6.8  Recent blood sugar patterns and problems identified: He has been on 1 mg Ozempic since about 08/2019 In the last 3 weeks he has had difficulty with Ozempic not being available at the pharmacy and likely has not taken it for a total of 4 to 5 weeks Also he has been less active with the issues related to GI bleeding In the last month he thinks he is trying to get back on the diet However he says he is having higher readings at home in the morning and was blaming this on Protonix Not doing any walking or exercise He has also continued the same dose of Tresiba, he did not increase the dose even when he had occasional readings about 200 Has been consistent with oral medications without side effects He thinks he has some difficulty with getting adequate blood sample on his fingersticks  Glucose monitoring: Using  Touch ultra meter less  than once a day,   Blood Glucose readings recent range 115-206  FASTING AVERAGE 161 Nonfasting range 115-146  Wt Readings from Last 3 Encounters:  03/12/21 274 lb 8 oz (124.5 kg)  02/12/21 278 lb (126.1 kg)  12/31/20 278 lb (126.1 kg)      Lab Results  Component Value Date   HGBA1C 6.8 (H) 03/09/2021   HGBA1C 6.7 (H) 11/04/2020   HGBA1C 6.6 (H) 04/18/2020   Lab Results  Component Value Date   MICROALBUR <0.7 11/04/2020   LDLCALC 117 (H) 03/09/2021   CREATININE 1.12 12/11/2020     Lab Results  Component Value Date   MICROALBUR <0.7 11/04/2020   Lab on 03/09/2021  Component Date Value Ref Range Status   Cholesterol 03/09/2021 178  0 - 200 mg/dL Final   ATP III Classification       Desirable:  < 200 mg/dL               Borderline High:  200 - 239 mg/dL          High:  > = 240 mg/dL   Triglycerides 03/09/2021 133.0  0.0 - 149.0 mg/dL Final   Normal:  <150 mg/dLBorderline High:  150 - 199 mg/dL   HDL 03/09/2021 34.40 (A) >39.00 mg/dL Final   VLDL 03/09/2021 26.6  0.0 - 40.0 mg/dL Final   LDL Cholesterol 03/09/2021 117 (A) 0 - 99 mg/dL Final   Total CHOL/HDL  Ratio 03/09/2021 5   Final                  Men          Women1/2 Average Risk     3.4          3.3Average Risk          5.0          4.42X Average Risk          9.6          7.13X Average Risk          15.0          11.0                       NonHDL 03/09/2021 143.47   Final   NOTE:  Non-HDL goal should be 30 mg/dL higher than patient's LDL goal (i.e. LDL goal of < 70 mg/dL, would have non-HDL goal of < 100 mg/dL)   Hgb A1c MFr Bld 03/09/2021 6.8 (A) 4.6 - 6.5 % Final   Glycemic Control Guidelines for People with Diabetes:Non Diabetic:  <6%Goal of Therapy: <7%Additional Action Suggested:  >8%        Allergies as of 03/12/2021       Reactions   Penicillins Anaphylaxis   Influenza Vac Split Quad    cellulitis   Pneumococcal Vaccines Other (See Comments)   Cellulitis, swelling        Medication List         Accurate as of March 12, 2021  1:03 PM. If you have any questions, ask your nurse or doctor.          allopurinol 100 MG tablet Commonly known as: ZYLOPRIM Take 1 tablet (100 mg total) by mouth 2 (two) times daily as needed. For gout   gabapentin 300 MG capsule Commonly known as: NEURONTIN Take 1 capsule 3 times a day as needed   Insulin Pen Needle 32G X 4 MM Misc Use to inject insulin once daily   Jardiance 25 MG Tabs tablet Generic drug: empagliflozin TAKE 1 TABLET(25 MG) BY MOUTH DAILY   metFORMIN 500 MG 24 hr tablet Commonly known as: GLUCOPHAGE-XR TAKE 2 TABLETS TWICE A DAY   OneTouch Delica Lancets 16X Misc Use to check blood sugar 2 times per day dx code E11.65   OneTouch Ultra test strip Generic drug: glucose blood USE TO CHECK BLOOD SUGAR TWICE DAILY.   Ozempic (1 MG/DOSE) 4 MG/3ML Sopn Generic drug: Semaglutide (1 MG/DOSE) INJECT 1MG  SUBCUTANEOUSLY EVERY 7 DAYS AS DIRECTED   pantoprazole 40 MG tablet Commonly known as: PROTONIX Take 1 tablet (40 mg total) by mouth daily.   rosuvastatin 10 MG tablet Commonly known as: CRESTOR TAKE 1 TABLET DAILY   Tresiba FlexTouch 100 UNIT/ML FlexTouch Pen Generic drug: insulin degludec INJECT 0.2ML (20 UNITS     TOTAL) SUBCUTANEOUSLY DAILY        Allergies:  Allergies  Allergen Reactions   Penicillins Anaphylaxis   Influenza Vac Split Quad     cellulitis   Pneumococcal Vaccines Other (See Comments)    Cellulitis, swelling    Past Medical History:  Diagnosis Date   Allergy    Bowel obstruction ([place]) 10/2011   Diabetes mellitus    Gout    Hx of adenomatous polyp of colon 01/22/2015   Hyperlipidemia    Hypertension    Umbilical hernia     Past Surgical History:  Procedure  Laterality Date   APPENDECTOMY     1980s   COLONOSCOPY W/ POLYPECTOMY  01/2015   LUMBAR SPINE SURGERY  2018   dr Ronnald Ramp    Family History  Problem Relation Age of Onset   Diabetes Mother    Hyperlipidemia Father     Hypertension Father    Dementia Father    Colon cancer Neg Hx    Esophageal cancer Neg Hx    Stomach cancer Neg Hx    Rectal cancer Neg Hx     Social History:  reports that he has been smoking cigarettes and cigars. He has a 9.50 pack-year smoking history. He has never used smokeless tobacco. He reports current alcohol use. He reports that he does not use drugs.    Review of Systems      Hyperlipidemia: He has mixed hyperlipidemia with increased LDL and persistently low HDL  He has continued 10 mg Crestor qod as recommended on his last visit He thinks he cannot tolerate daily Crestor because of muscle aches  Previously on Lipitor which caused muscle aches LDL is improved but still above 100, previously has been as low as 78 with higher doses  Also has had low HDL and variable triglycerides  Lab Results  Component Value Date   CHOL 178 03/09/2021   CHOL 225 (H) 11/04/2020   CHOL 147 04/18/2020   Lab Results  Component Value Date   HDL 34.40 (L) 03/09/2021   HDL 32.70 (L) 11/04/2020   HDL 29.40 (L) 04/18/2020   Lab Results  Component Value Date   LDLCALC 117 (H) 03/09/2021   LDLCALC 78 04/18/2020   LDLCALC 100 (H) 01/29/2019   Lab Results  Component Value Date   TRIG 133.0 03/09/2021   TRIG 223.0 (H) 11/04/2020   TRIG 196.0 (H) 04/18/2020   Lab Results  Component Value Date   CHOLHDL 5 03/09/2021   CHOLHDL 7 11/04/2020   CHOLHDL 5 04/18/2020   Lab Results  Component Value Date   LDLDIRECT 160.0 11/04/2020   LDLDIRECT 141.0 09/11/2019   LDLDIRECT 146.0 09/04/2018     Neuropathy:   He has had painful burning sensation in his feet, mainly at night  Symptoms are minimal now, still not needing gabapentin    Foot exam was normal in 04/2020  Blood pressure: Has had variable blood pressure readings in the office and usually not high at home   BP Readings from Last 3 Encounters:  03/12/21 118/78  02/12/21 101/74  12/31/20 120/70     LABS:   Lab on  03/09/2021  Component Date Value Ref Range Status   Cholesterol 03/09/2021 178  0 - 200 mg/dL Final   ATP III Classification       Desirable:  < 200 mg/dL               Borderline High:  200 - 239 mg/dL          High:  > = 240 mg/dL   Triglycerides 03/09/2021 133.0  0.0 - 149.0 mg/dL Final   Normal:  <150 mg/dLBorderline High:  150 - 199 mg/dL   HDL 03/09/2021 34.40 (A) >39.00 mg/dL Final   VLDL 03/09/2021 26.6  0.0 - 40.0 mg/dL Final   LDL Cholesterol 03/09/2021 117 (A) 0 - 99 mg/dL Final   Total CHOL/HDL Ratio 03/09/2021 5   Final                  Men  Women1/2 Average Risk     3.4          3.3Average Risk          5.0          4.42X Average Risk          9.6          7.13X Average Risk          15.0          11.0                       NonHDL 03/09/2021 143.47   Final   NOTE:  Non-HDL goal should be 30 mg/dL higher than patient's LDL goal (i.e. LDL goal of < 70 mg/dL, would have non-HDL goal of < 100 mg/dL)   Hgb A1c MFr Bld 03/09/2021 6.8 (A) 4.6 - 6.5 % Final   Glycemic Control Guidelines for People with Diabetes:Non Diabetic:  <6%Goal of Therapy: <7%Additional Action Suggested:  >8%     Physical Examination:  BP 118/78   Pulse (!) 102   Ht 5\' 10"  (1.778 m)   Wt 274 lb 8 oz (124.5 kg)   SpO2 98%   BMI 39.39 kg/m     ASSESSMENT/PLAN:  Diabetes type 2 with obesity   See history of present illness for detailed discussion of current diabetes management, blood sugar patterns and problems identified  He is on a regimen of basal insulin, Jardiance, Metformin and Ozempic 1 mg weekly  His A1c is still fairly good at 6.8  Although his weight is slightly better his blood sugars have been intermittently higher partly from being off Ozempic due to supply issues as well as inactivity and inconsistent diet He may do better with higher dose of Tresiba since his recent fasting readings are mostly over 140 but he prefers to wait until he does better with exercise regimen and  consistent diet  LIPIDS: Previously had better control with taking Crestor 10 mg daily However he thinks he cannot take this daily and is doing this every other day with LDL now 117  He will do better with adding Zetia but he wants to work on his diet for longer period of time before increasing his medications Follow-up in 3 months  History of neuropathy: No symptoms recently   There are no Patient Instructions on file for this visit.   Elayne Snare 03/12/2021, 1:03 PM

## 2021-03-12 NOTE — Patient Instructions (Signed)
Check blood sugars on waking up 3 days a week  Also check blood sugars about 2 hours after meals and do this after different meals by rotation  Recommended blood sugar levels on waking up are 90-130 and about 2 hours after meal is 130-160  Please bring your blood sugar monitor to each visit, thank you  Exercise daily

## 2021-03-17 ENCOUNTER — Other Ambulatory Visit: Payer: Self-pay | Admitting: Endocrinology

## 2021-03-28 ENCOUNTER — Other Ambulatory Visit: Payer: Self-pay

## 2021-03-28 ENCOUNTER — Other Ambulatory Visit: Payer: Self-pay | Admitting: Endocrinology

## 2021-03-28 DIAGNOSIS — Z794 Long term (current) use of insulin: Secondary | ICD-10-CM

## 2021-03-28 MED ORDER — ONETOUCH VERIO VI STRP
ORAL_STRIP | 3 refills | Status: DC
Start: 2021-03-28 — End: 2022-08-09

## 2021-03-28 MED ORDER — ONETOUCH VERIO VI STRP: ORAL_STRIP | 3 refills | Status: DC

## 2021-03-28 MED ORDER — ONETOUCH DELICA LANCETS 33G MISC: 3 refills | Status: DC

## 2021-03-28 NOTE — Telephone Encounter (Signed)
Rx was sent on another message.

## 2021-04-13 IMAGING — CT CT ABD-PELV W/ CM
2 of 5 series · 16 of 46 positions shown, 18 images · IV contrast (Omnipaque)
Comparison: CT abdomen pelvis 10/13/2011

CLINICAL DATA: Diverticulitis suspected abd bloating, rectal bleed
RLQ pain

EXAM:
CT ABDOMEN AND PELVIS WITH CONTRAST
TECHNIQUE: Multidetector CT imaging of the abdomen and pelvis was performed
using the standard protocol following bolus administration of
intravenous contrast.
CONTRAST:  100mL OMNIPAQUE IOHEXOL 300 MG/ML  SOLN

[Series 2: axial st · axial · 0.98mm/px · z∈[-570,-104]mm · 13 of 105 slices shown, 15 images]
[im 6/105  soft-tissue]
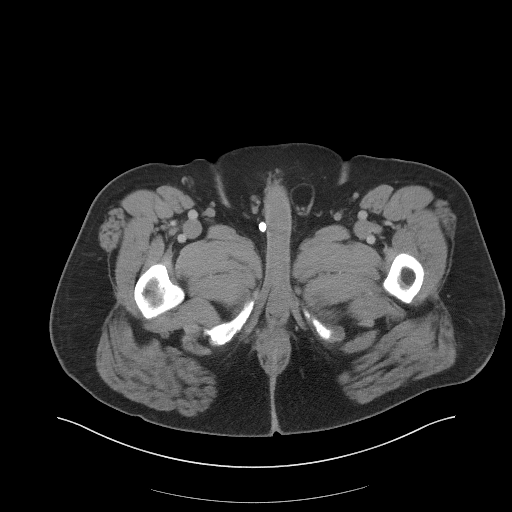
[im 6/105  bone]
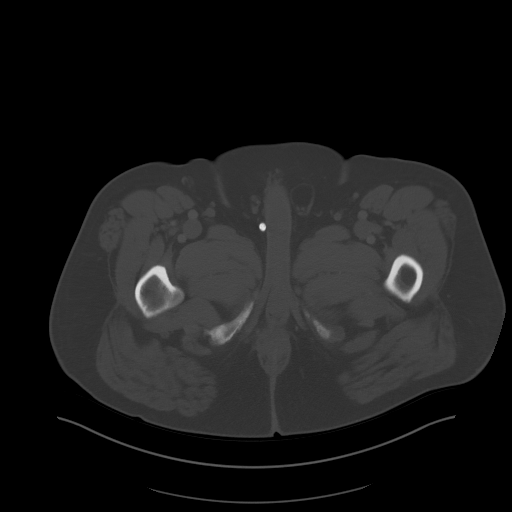
[im 12/105  soft-tissue]
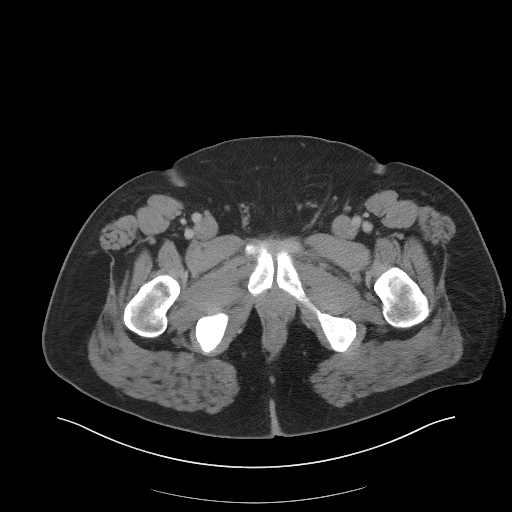
[im 24/105  soft-tissue]
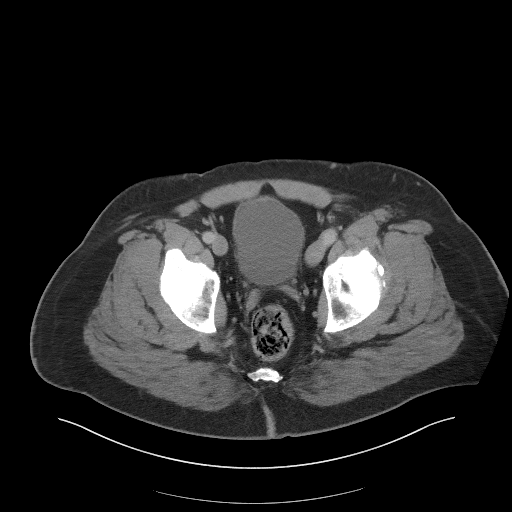
[im 29/105  soft-tissue]
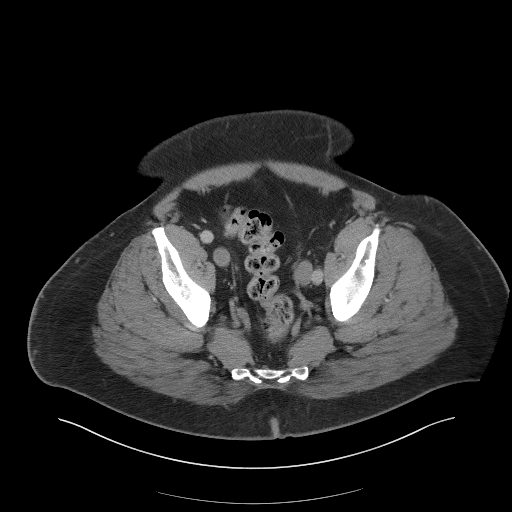
[im 35/105  soft-tissue]
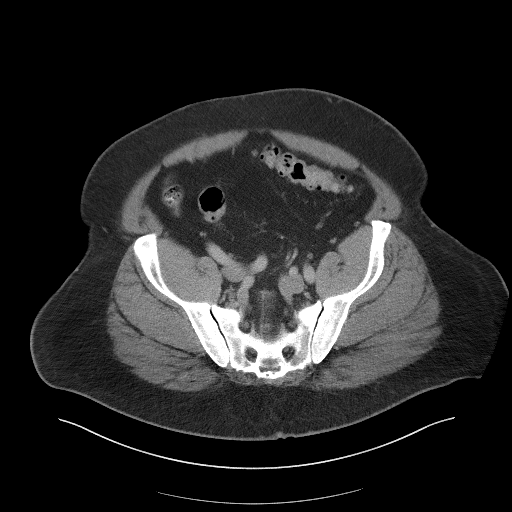
[im 47/105  soft-tissue]
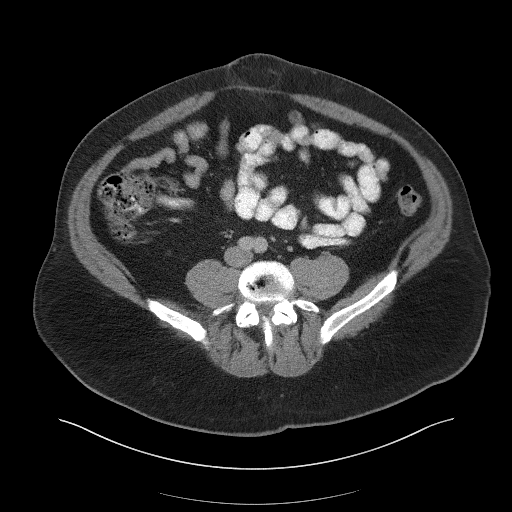
[im 53/105  soft-tissue]
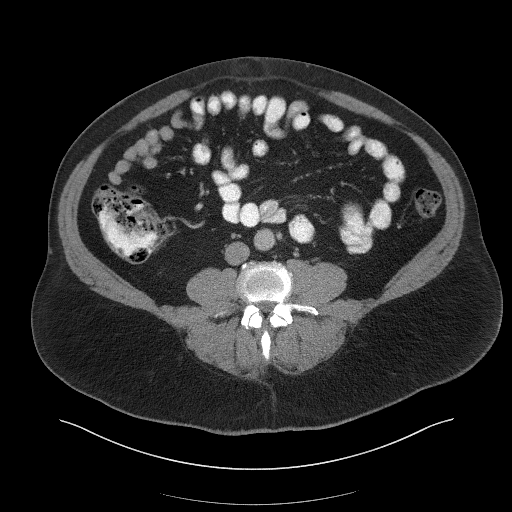
[im 58/105  soft-tissue]
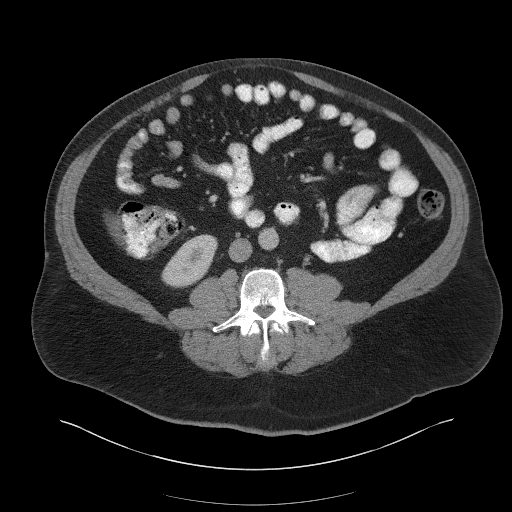
[im 70/105  soft-tissue]
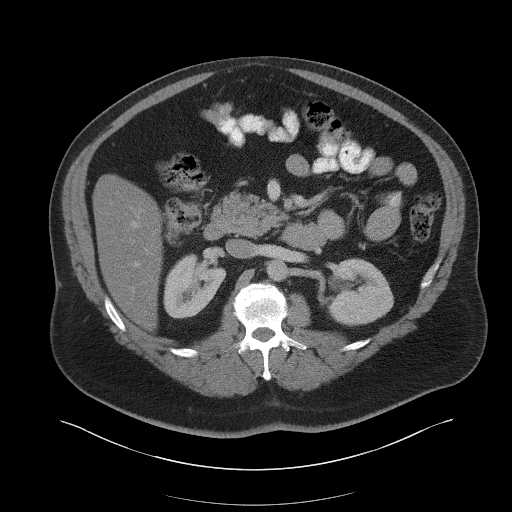
[im 70/105  bone]
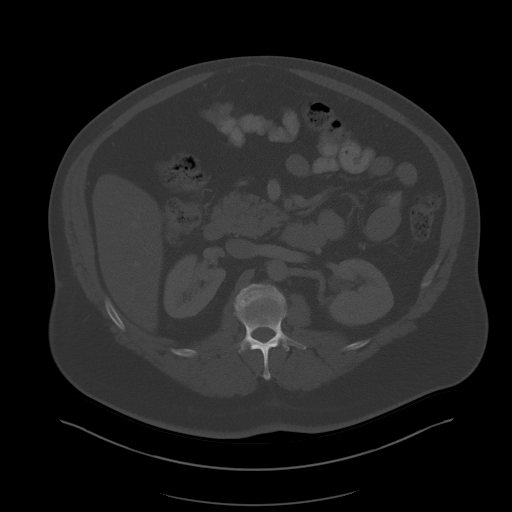
[im 76/105  soft-tissue]
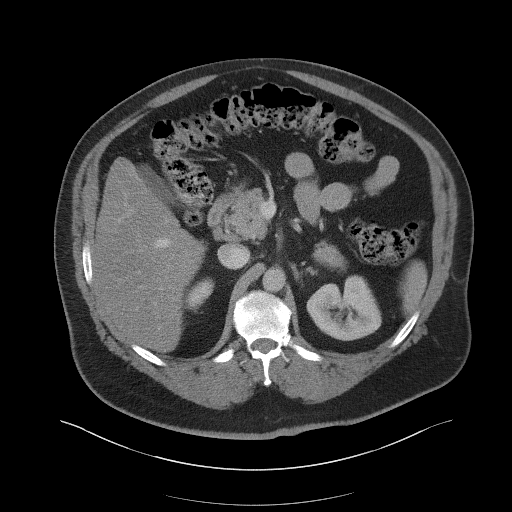
[im 81/105  soft-tissue]
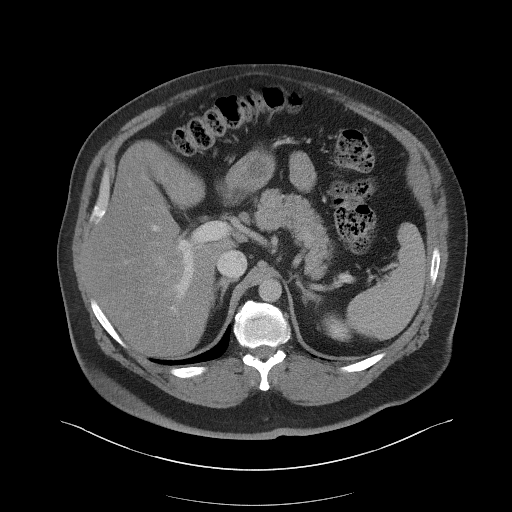
[im 93/105  soft-tissue]
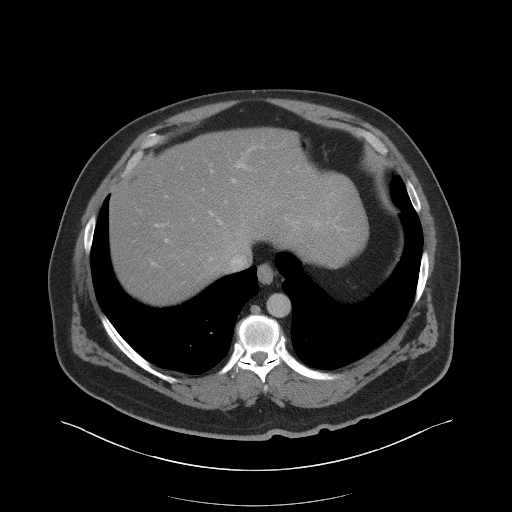
[im 99/105  soft-tissue]
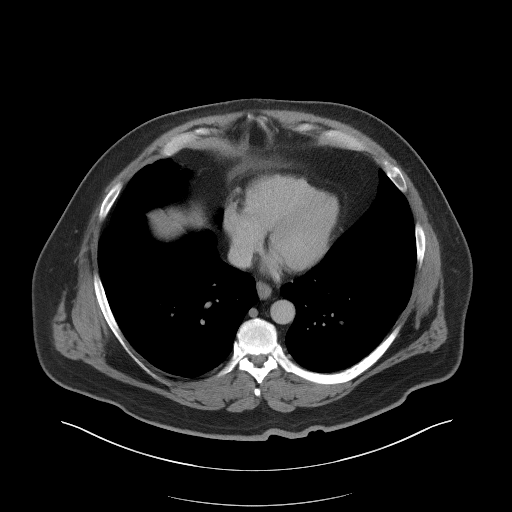

[Series 5: coronal st · coronal · 0.95mm/px · 3 of 127 slices shown]
[im 43/127  soft-tissue]
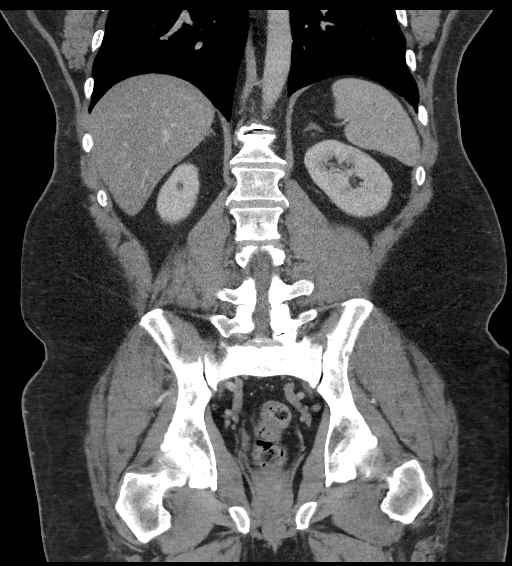
[im 57/127  soft-tissue]
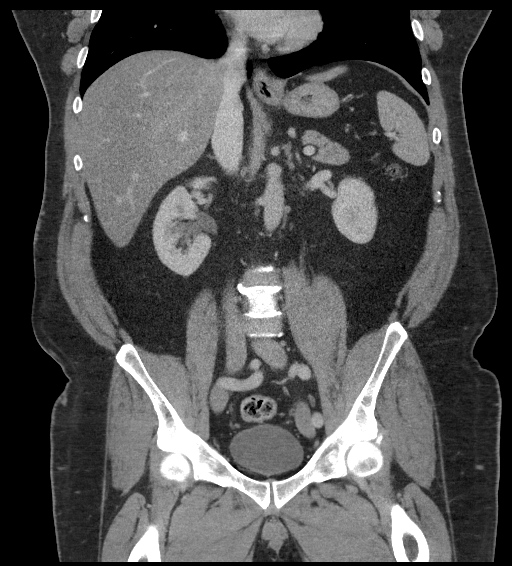
[im 71/127  soft-tissue]
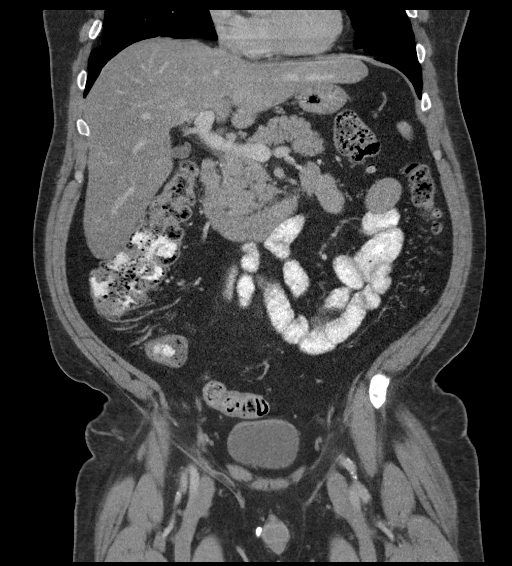

[16 of 46 positions shown; findings below may reference images not displayed]

FINDINGS: Lower chest: 6 mm left lower lobe pulmonary nodule stable from
prior. No acute abnormality.

Hepatobiliary: The liver is enlarged measuring up to 21 cm. The
hepatic parenchyma is diffusely mildly hypodense compared to the
splenic parenchyma consistent with fatty infiltration. No focal
liver abnormality. No gallstones, gallbladder wall thickening, or
pericholecystic fluid. No biliary dilatation.

Pancreas: No focal lesion. Normal pancreatic contour. No surrounding
inflammatory changes. No main pancreatic ductal dilatation.

Spleen: Normal in size without focal abnormality.

Adrenals/Urinary Tract: No adrenal nodule bilaterally. Bilateral
kidneys enhance symmetrically. No hydronephrosis. No hydroureter.
The urinary bladder is unremarkable.

Stomach/Bowel: PO contrast reaches the ascending colon. Stomach is
within normal limits. No evidence of bowel wall thickening or
dilatation. Scattered colonic diverticulosis. Status post
appendectomy. Unremarkable appendiceal stump noted.

Vascular/Lymphatic: No abdominal aorta or iliac aneurysm. Mild
atherosclerotic plaque of the aorta and its branches. No abdominal,
pelvic, or inguinal lymphadenopathy.

Reproductive: Prostate is unremarkable.

Other: No intraperitoneal free fluid. No intraperitoneal free gas.
No organized fluid collection.

Musculoskeletal:

Tiny fat containing umbilical hernia with an abdominal defect of
cm. Possible tiny fat containing left inguinal hernia.

No suspicious lytic or blastic osseous lesions. No acute displaced
fracture. Multilevel mild degenerative changes of the spine.
IMPRESSION: 1. Scattered colonic diverticulosis with no acute diverticulitis.
2. Mild hepatic steatosis and hepatomegaly.
3. Small fat containing paraumbilical hernia with no associated
findings suggest ischemia or associated bowel obstruction.

## 2021-06-10 ENCOUNTER — Other Ambulatory Visit: Payer: Self-pay

## 2021-06-10 ENCOUNTER — Other Ambulatory Visit (INDEPENDENT_AMBULATORY_CARE_PROVIDER_SITE_OTHER): Payer: BC Managed Care – PPO

## 2021-06-10 DIAGNOSIS — Z794 Long term (current) use of insulin: Secondary | ICD-10-CM

## 2021-06-10 DIAGNOSIS — E782 Mixed hyperlipidemia: Secondary | ICD-10-CM | POA: Diagnosis not present

## 2021-06-10 DIAGNOSIS — E1165 Type 2 diabetes mellitus with hyperglycemia: Secondary | ICD-10-CM | POA: Diagnosis not present

## 2021-06-10 LAB — BASIC METABOLIC PANEL
BUN: 15 mg/dL (ref 6–23)
CO2: 24 mEq/L (ref 19–32)
Calcium: 9.6 mg/dL (ref 8.4–10.5)
Chloride: 103 mEq/L (ref 96–112)
Creatinine, Ser: 1.01 mg/dL (ref 0.40–1.50)
GFR: 82.55 mL/min (ref >60.00)
Glucose, Bld: 123 mg/dL — ABNORMAL HIGH (ref 70–99)
Potassium: 4.2 mEq/L (ref 3.5–5.1)
Sodium: 137 mEq/L (ref 135–145)

## 2021-06-10 LAB — LDL CHOLESTEROL, DIRECT: Direct LDL: 92 mg/dL

## 2021-06-10 LAB — HEMOGLOBIN A1C: Hgb A1c MFr Bld: 6.8 % — ABNORMAL HIGH (ref 4.6–6.5)

## 2021-06-12 ENCOUNTER — Ambulatory Visit: Payer: BC Managed Care – PPO | Admitting: Endocrinology

## 2021-06-17 ENCOUNTER — Other Ambulatory Visit: Payer: Self-pay

## 2021-06-17 ENCOUNTER — Ambulatory Visit: Payer: BC Managed Care – PPO | Admitting: Endocrinology

## 2021-06-17 ENCOUNTER — Encounter: Payer: Self-pay | Admitting: Endocrinology

## 2021-06-17 VITALS — BP 114/80 | HR 86 | Ht 70.0 in | Wt 268.8 lb

## 2021-06-17 DIAGNOSIS — E782 Mixed hyperlipidemia: Secondary | ICD-10-CM

## 2021-06-17 DIAGNOSIS — E1165 Type 2 diabetes mellitus with hyperglycemia: Secondary | ICD-10-CM | POA: Diagnosis not present

## 2021-06-17 DIAGNOSIS — Z794 Long term (current) use of insulin: Secondary | ICD-10-CM | POA: Diagnosis not present

## 2021-06-17 NOTE — Progress Notes (Signed)
Patient ID: ZAVIEN FRANCIONE, male   DOB: [birth year], 55 y.o.   MRN: [id number]    Reason for Appointment : Endocrinology follow up  History of Present Illness          DIABETES: He has type 2 diabetes mellitus, date of diagnosis 2008  Background information: Initial diagnosis was made incidentally and had only mild hyperglycemia. Initially was treated with metformin alone and then Actos was added He has not had any formal diabetes education and has had difficulty losing weight His blood sugars have been only fairly well controlled with A1c usually in the 7-8 range Because of inadequate control in 2013 he was given Victoza injection in addition. However Actos was stopped  He thinks this helped bring his blood sugars better controlled but helped his portion control only slightly He did not lose much weight with Victoza In 6/14 he was given Invokana in addition and his Victoza was resumed.  RECENT history    Treatment regimen: Insulin:  Tresiba 20 units in a.m.; non-insulin drugs: Ozempic 1 mg weekly, Jardiance 25 mg, metformin 2000 mg  A1c is 6.8 as before  Recent blood sugar patterns and problems identified: He has been able to gradually lose weight although he thinks that his weight was 258 last month before he went on vacation He we will check only fasting blood sugars and only rarely later in the day  When he checks his blood sugars around 4:30 AM they are mostly high and averaging nearly 150  However blood sugars later in the morning including in the lab are usually better and as low as 116  He has not done any formal exercise in the last month at least  Except for his medication his diet has been fairly good  He has been asking about herbal supplements for weight loss No hypoglycemia with current insulin dose  Glucose monitoring: Using  Touch ultra meter less than once a day,   Blood Glucose readings   FASTING range recently 130-181, average 147  Nonfasting  122-161  Previous range 115-206   Wt Readings from Last 3 Encounters:  06/17/21 268 lb 12.8 oz (121.9 kg)  03/12/21 274 lb 8 oz (124.5 kg)  02/12/21 278 lb (126.1 kg)      Lab Results  Component Value Date   HGBA1C 6.8 (H) 06/10/2021   HGBA1C 6.8 (H) 03/09/2021   HGBA1C 6.7 (H) 11/04/2020   Lab Results  Component Value Date   MICROALBUR <0.7 11/04/2020   LDLCALC 117 (H) 03/09/2021   CREATININE 1.01 06/10/2021     Lab Results  Component Value Date   MICROALBUR <0.7 11/04/2020   No visits with results within 1 Week(s) from this visit.  Latest known visit with results is:  Lab on 06/10/2021  Component Date Value Ref Range Status   Direct LDL 06/10/2021 92.0  mg/dL Final   Optimal:  <100 mg/dLNear or Above Optimal:  100-129 mg/dLBorderline High:  130-159 mg/dLHigh:  160-189 mg/dLVery High:  >190 mg/dL   Sodium 06/10/2021 137  135 - 145 mEq/L Final   Potassium 06/10/2021 4.2  3.5 - 5.1 mEq/L Final   Chloride 06/10/2021 103  96 - 112 mEq/L Final   CO2 06/10/2021 24  19 - 32 mEq/L Final   Glucose, Bld 06/10/2021 123 (A) 70 - 99 mg/dL Final   BUN 06/10/2021 15  6 - 23 mg/dL Final   Creatinine, Ser 06/10/2021 1.01  0.40 - 1.50 mg/dL Final   GFR 06/10/2021 82.55  >  60.00 mL/min Final   Calculated using the CKD-EPI Creatinine Equation (2021)   Calcium 06/10/2021 9.6  8.4 - 10.5 mg/dL Final   Hgb A1c MFr Bld 06/10/2021 6.8 (A) 4.6 - 6.5 % Final   Glycemic Control Guidelines for People with Diabetes:Non Diabetic:  <6%Goal of Therapy: <7%Additional Action Suggested:  >8%        Allergies as of 06/17/2021       Reactions   Penicillins Anaphylaxis   Influenza Vac Split Quad    cellulitis   Pneumococcal Vaccines Other (See Comments)   Cellulitis, swelling        Medication List        Accurate as of June 17, 2021  8:47 AM. If you have any questions, ask your nurse or doctor.          allopurinol 100 MG tablet Commonly known as: ZYLOPRIM Take 1 tablet (100  mg total) by mouth 2 (two) times daily as needed. For gout   gabapentin 300 MG capsule Commonly known as: NEURONTIN Take 1 capsule 3 times a day as needed   Insulin Pen Needle 32G X 4 MM Misc Use to inject insulin once daily   Jardiance 25 MG Tabs tablet Generic drug: empagliflozin TAKE 1 TABLET(25 MG) BY MOUTH DAILY   metFORMIN 500 MG 24 hr tablet Commonly known as: GLUCOPHAGE-XR TAKE 2 TABLETS TWICE A DAY   OneTouch Delica Lancets 99991111 Misc Use to check blood sugar 2 times per day dx code E11.65   OneTouch Verio test strip Generic drug: glucose blood Use as instructed   Ozempic (1 MG/DOSE) 4 MG/3ML Sopn Generic drug: Semaglutide (1 MG/DOSE) INJECT '1MG'$  SUBCUTANEOUSLY EVERY 7 DAYS AS DIRECTED   pantoprazole 40 MG tablet Commonly known as: PROTONIX Take 1 tablet (40 mg total) by mouth daily.   rosuvastatin 10 MG tablet Commonly known as: CRESTOR TAKE 1 TABLET DAILY   Tresiba FlexTouch 100 UNIT/ML FlexTouch Pen Generic drug: insulin degludec INJECT 0.2ML (20 UNITS     TOTAL) SUBCUTANEOUSLY DAILY        Allergies:  Allergies  Allergen Reactions   Penicillins Anaphylaxis   Influenza Vac Split Quad     cellulitis   Pneumococcal Vaccines Other (See Comments)    Cellulitis, swelling    Past Medical History:  Diagnosis Date   Allergy    Bowel obstruction ([place]) 10/2011   Diabetes mellitus    Gout    Hx of adenomatous polyp of colon 01/22/2015   Hyperlipidemia    Hypertension    Umbilical hernia     Past Surgical History:  Procedure Laterality Date   APPENDECTOMY     1980s   COLONOSCOPY W/ POLYPECTOMY  01/2015   LUMBAR SPINE SURGERY  2018   dr Ronnald Ramp    Family History  Problem Relation Age of Onset   Diabetes Mother    Hyperlipidemia Father    Hypertension Father    Dementia Father    Colon cancer Neg Hx    Esophageal cancer Neg Hx    Stomach cancer Neg Hx    Rectal cancer Neg Hx     Social History:  reports that he has been smoking cigarettes  and cigars. He has a 9.50 pack-year smoking history. He has never used smokeless tobacco. He reports current alcohol use. He reports that he does not use drugs.    Review of Systems   Hyperlipidemia: He has mixed hyperlipidemia with increased LDL and persistently low HDL  He has been able to  take 10 mg Crestor every day instead of every other day  Now he does not think he has any muscle aches with this dose  LDL is now back down to below 100 Previously on Lipitor which caused muscle aches  Also has had low HDL and variable triglycerides  Lab Results  Component Value Date   CHOL 178 03/09/2021   CHOL 225 (H) 11/04/2020   CHOL 147 04/18/2020   Lab Results  Component Value Date   HDL 34.40 (L) 03/09/2021   HDL 32.70 (L) 11/04/2020   HDL 29.40 (L) 04/18/2020   Lab Results  Component Value Date   LDLCALC 117 (H) 03/09/2021   LDLCALC 78 04/18/2020   LDLCALC 100 (H) 01/29/2019   Lab Results  Component Value Date   TRIG 133.0 03/09/2021   TRIG 223.0 (H) 11/04/2020   TRIG 196.0 (H) 04/18/2020   Lab Results  Component Value Date   CHOLHDL 5 03/09/2021   CHOLHDL 7 11/04/2020   CHOLHDL 5 04/18/2020   Lab Results  Component Value Date   LDLDIRECT 92.0 06/10/2021   LDLDIRECT 160.0 11/04/2020   LDLDIRECT 141.0 09/11/2019     Neuropathy:   He has had painful burning sensation in his feet, mainly at night  Symptoms are minimal now, still not needing gabapentin    Foot exam was normal in 04/2020  Blood pressure: Has better readings   BP Readings from Last 3 Encounters:  06/17/21 114/80  03/12/21 118/78  02/12/21 101/74     LABS:   No visits with results within 1 Week(s) from this visit.  Latest known visit with results is:  Lab on 06/10/2021  Component Date Value Ref Range Status   Direct LDL 06/10/2021 92.0  mg/dL Final   Optimal:  <100 mg/dLNear or Above Optimal:  100-129 mg/dLBorderline High:  130-159 mg/dLHigh:  160-189 mg/dLVery High:  >190 mg/dL   Sodium  06/10/2021 137  135 - 145 mEq/L Final   Potassium 06/10/2021 4.2  3.5 - 5.1 mEq/L Final   Chloride 06/10/2021 103  96 - 112 mEq/L Final   CO2 06/10/2021 24  19 - 32 mEq/L Final   Glucose, Bld 06/10/2021 123 (A) 70 - 99 mg/dL Final   BUN 06/10/2021 15  6 - 23 mg/dL Final   Creatinine, Ser 06/10/2021 1.01  0.40 - 1.50 mg/dL Final   GFR 06/10/2021 82.55  >60.00 mL/min Final   Calculated using the CKD-EPI Creatinine Equation (2021)   Calcium 06/10/2021 9.6  8.4 - 10.5 mg/dL Final   Hgb A1c MFr Bld 06/10/2021 6.8 (A) 4.6 - 6.5 % Final   Glycemic Control Guidelines for People with Diabetes:Non Diabetic:  <6%Goal of Therapy: <7%Additional Action Suggested:  >8%     Physical Examination:  BP 114/80   Pulse 86   Ht '5\' 10"'$  (1.778 m)   Wt 268 lb 12.8 oz (121.9 kg)   SpO2 97%   BMI 38.57 kg/m     ASSESSMENT/PLAN:  Diabetes type 2 with obesity   See history of present illness for detailed discussion of current diabetes management, blood sugar patterns and problems identified  He is on a regimen of basal insulin, Jardiance, Metformin and Ozempic 1 mg weekly  His A1c is again fairly good at 6.8  He tends to have high fasting readings averaging about 149 although these are mostly when he wakes up  Usually not able to remember to check readings after meals and not clear if they are higher  Weight has come down  likely with consistent diet  Also can resume exercise which he has not done  Discussed various options for further improvement in his control and weight loss including increasing Ozempic, Mounjaro and also he was asking about herbal supplements  Since he is doing relatively well with Ozempic we will try 2 mg if available and consider Mounjaro on the next visit if his progress is not sufficient Okay to try the herbal supplement for go Lo Subsequently  LIPIDS: Has better control with taking Crestor 10 mg daily which he is now tolerating with LDL below 100 now  Blood pressure is  normal without medications   There are no Patient Instructions on file for this visit.  Total visit time for evaluation and management and counseling = 30 minutes  Elayne Snare 06/17/2021, 8:47 AM

## 2021-06-17 NOTE — Patient Instructions (Signed)
Check blood sugars on waking up 3-4 days a week  Also check blood sugars about 2 hours after meals and do this after different meals by rotation  Recommended blood sugar levels on waking up are 90-130 and about 2 hours after meal is 130-160  Please bring your blood sugar monitor to each visit, thank you  Resume exercise

## 2021-06-28 ENCOUNTER — Other Ambulatory Visit: Payer: Self-pay | Admitting: Endocrinology

## 2021-06-29 ENCOUNTER — Telehealth: Payer: Self-pay

## 2021-06-29 NOTE — Telephone Encounter (Signed)
Pharmacy request refill on Tresiba Rx but no documentation on last office visit that patient is on this medication. Please advise

## 2021-06-29 NOTE — Telephone Encounter (Signed)
Message sent to Dr Dwyane Dee for clarification to see if patient is on this medication.

## 2021-06-29 NOTE — Telephone Encounter (Signed)
Rx sent to pharmacy   

## 2021-07-09 DIAGNOSIS — Z794 Long term (current) use of insulin: Secondary | ICD-10-CM

## 2021-07-09 DIAGNOSIS — E1165 Type 2 diabetes mellitus with hyperglycemia: Secondary | ICD-10-CM

## 2021-07-09 MED ORDER — OZEMPIC (1 MG/DOSE) 4 MG/3ML ~~LOC~~ SOPN: PEN_INJECTOR | SUBCUTANEOUS | 1 refills | Status: DC

## 2021-07-28 ENCOUNTER — Other Ambulatory Visit: Payer: Self-pay | Admitting: Endocrinology

## 2021-09-11 ENCOUNTER — Other Ambulatory Visit: Payer: Self-pay | Admitting: Endocrinology

## 2021-09-14 ENCOUNTER — Other Ambulatory Visit: Payer: BC Managed Care – PPO

## 2021-09-18 ENCOUNTER — Ambulatory Visit: Payer: BC Managed Care – PPO | Admitting: Endocrinology

## 2021-10-12 ENCOUNTER — Other Ambulatory Visit (INDEPENDENT_AMBULATORY_CARE_PROVIDER_SITE_OTHER): Payer: BC Managed Care – PPO

## 2021-10-12 ENCOUNTER — Other Ambulatory Visit: Payer: Self-pay

## 2021-10-12 DIAGNOSIS — E1165 Type 2 diabetes mellitus with hyperglycemia: Secondary | ICD-10-CM | POA: Diagnosis not present

## 2021-10-12 DIAGNOSIS — E782 Mixed hyperlipidemia: Secondary | ICD-10-CM | POA: Diagnosis not present

## 2021-10-12 DIAGNOSIS — Z794 Long term (current) use of insulin: Secondary | ICD-10-CM | POA: Diagnosis not present

## 2021-10-12 LAB — COMPREHENSIVE METABOLIC PANEL
ALT: 34 U/L (ref 0–53)
AST: 26 U/L (ref 0–37)
Albumin: 4.7 g/dL (ref 3.5–5.2)
Alkaline Phosphatase: 55 U/L (ref 39–117)
BUN: 18 mg/dL (ref 6–23)
CO2: 24 mEq/L (ref 19–32)
Calcium: 9.9 mg/dL (ref 8.4–10.5)
Chloride: 103 mEq/L (ref 96–112)
Creatinine, Ser: 1 mg/dL (ref 0.40–1.50)
GFR: 83.34 mL/min (ref >60.00)
Glucose, Bld: 88 mg/dL (ref 70–99)
Potassium: 4.4 mEq/L (ref 3.5–5.1)
Sodium: 138 mEq/L (ref 135–145)
Total Bilirubin: 0.7 mg/dL (ref 0.2–1.2)
Total Protein: 7.2 g/dL (ref 6.0–8.3)

## 2021-10-12 LAB — LIPID PANEL
Cholesterol: 203 mg/dL — ABNORMAL HIGH (ref 0–200)
HDL: 34.3 mg/dL — ABNORMAL LOW (ref >39.00)
LDL Cholesterol: 133 mg/dL — ABNORMAL HIGH (ref 0–99)
NonHDL: 168.89
Total CHOL/HDL Ratio: 6
Triglycerides: 177 mg/dL — ABNORMAL HIGH (ref 0.0–149.0)
VLDL: 35.4 mg/dL (ref 0.0–40.0)

## 2021-10-12 LAB — HEMOGLOBIN A1C: Hgb A1c MFr Bld: 7.3 % — ABNORMAL HIGH (ref 4.6–6.5)

## 2021-10-14 ENCOUNTER — Other Ambulatory Visit: Payer: Self-pay | Admitting: Endocrinology

## 2021-10-16 ENCOUNTER — Ambulatory Visit: Payer: BC Managed Care – PPO | Admitting: Endocrinology

## 2021-10-20 ENCOUNTER — Encounter: Payer: Self-pay | Admitting: Endocrinology

## 2021-10-20 ENCOUNTER — Other Ambulatory Visit: Payer: Self-pay

## 2021-10-20 ENCOUNTER — Ambulatory Visit: Payer: BC Managed Care – PPO | Admitting: Endocrinology

## 2021-10-20 VITALS — BP 132/82 | HR 100 | Ht 70.0 in | Wt 280.8 lb

## 2021-10-20 DIAGNOSIS — Z794 Long term (current) use of insulin: Secondary | ICD-10-CM

## 2021-10-20 DIAGNOSIS — E782 Mixed hyperlipidemia: Secondary | ICD-10-CM | POA: Diagnosis not present

## 2021-10-20 DIAGNOSIS — E1165 Type 2 diabetes mellitus with hyperglycemia: Secondary | ICD-10-CM | POA: Diagnosis not present

## 2021-10-20 NOTE — Patient Instructions (Addendum)
Check blood sugars on waking up 4 days a week  Also check blood sugars about 2 hours after meals and do this after different meals by rotation  Recommended blood sugar levels on waking up are 90-130 and about 2 hours after meal is 130-160  Please bring your blood sugar monitor to each visit, thank you  Check on 2mg  Ozempic  Exercise!

## 2021-10-20 NOTE — Progress Notes (Signed)
Patient ID: Greg Kane, male   DOB: Jan 15, 1966, 56 y.o.   MRN: [id number]    Reason for Appointment : Endocrinology follow up  History of Present Illness          DIABETES: He has type 2 diabetes mellitus, date of diagnosis 2008  Background information: Initial diagnosis was made incidentally and had only mild hyperglycemia. Initially was treated with metformin alone and then Actos was added He has not had any formal diabetes education and has had difficulty losing weight His blood sugars have been only fairly well controlled with A1c usually in the 7-8 range Because of inadequate control in 2013 he was given Victoza injection in addition. However Actos was stopped  He thinks this helped bring his blood sugars better controlled but helped his portion control only slightly He did not lose much weight with Victoza In 6/14 he was given Invokana in addition and his Victoza was resumed.  RECENT history    Treatment regimen: Insulin:  Tresiba 20 units in a.m.; non-insulin drugs: Ozempic 1 mg weekly, Jardiance 25 mg, metformin 2000 mg  A1c is 7.3, previously stable at 6.8   Recent blood sugar patterns and problems identified: He previously had been able to gradually lose weight although since September he has gained 12 pounds  He states that over the last 4 to 6 weeks he has been totally off his diet and eating liberally Also has been occasionally been late for his Ozempic by about a week or so because of lack of availability of the medication at the pharmacy However has taken it regularly for the last month or so  Not doing any formal exercise also lately  As before his fasting blood sugars are mostly high when he checks them usually around 5 AM but better later in the morning including 88 in the lab at 9 AM Continues to be on the same dose of [place] He forgets to check his blood sugars after meals despite repeated reminders Once his blood sugar midday was 128  Glucose  monitoring: Verio  Blood Glucose readings as follows, meter not downloaded  Recent fasting range 130-192, 30-day average 153   PREVIOUS FASTING range recently 130-181, average 147  Nonfasting 122-161    Wt Readings from Last 3 Encounters:  10/20/21 280 lb 12.8 oz (127.4 kg)  06/17/21 268 lb 12.8 oz (121.9 kg)  03/12/21 274 lb 8 oz (124.5 kg)      Lab Results  Component Value Date   HGBA1C 7.3 (H) 10/12/2021   HGBA1C 6.8 (H) 06/10/2021   HGBA1C 6.8 (H) 03/09/2021   Lab Results  Component Value Date   MICROALBUR <0.7 11/04/2020   LDLCALC 133 (H) 10/12/2021   CREATININE 1.00 10/12/2021     Lab Results  Component Value Date   MICROALBUR <0.7 11/04/2020   No visits with results within 1 Week(s) from this visit.  Latest known visit with results is:  Lab on 10/12/2021  Component Date Value Ref Range Status   Cholesterol 10/12/2021 203 (H)  0 - 200 mg/dL Final   ATP III Classification       Desirable:  < 200 mg/dL               Borderline High:  200 - 239 mg/dL          High:  > = 240 mg/dL   Triglycerides 10/12/2021 177.0 (H)  0.0 - 149.0 mg/dL Final   Normal:  <150 mg/dLBorderline High:  150 -  199 mg/dL   HDL 10/12/2021 34.30 (L)  >39.00 mg/dL Final   VLDL 10/12/2021 35.4  0.0 - 40.0 mg/dL Final   LDL Cholesterol 10/12/2021 133 (H)  0 - 99 mg/dL Final   Total CHOL/HDL Ratio 10/12/2021 6   Final                  Men          Women1/2 Average Risk     3.4          3.3Average Risk          5.0          4.42X Average Risk          9.6          7.13X Average Risk          15.0          11.0                       NonHDL 10/12/2021 168.89   Final   NOTE:  Non-HDL goal should be 30 mg/dL higher than patient's LDL goal (i.e. LDL goal of < 70 mg/dL, would have non-HDL goal of < 100 mg/dL)   Sodium 10/12/2021 138  135 - 145 mEq/L Final   Potassium 10/12/2021 4.4  3.5 - 5.1 mEq/L Final   Chloride 10/12/2021 103  96 - 112 mEq/L Final   CO2 10/12/2021 24  19 - 32 mEq/L Final    Glucose, Bld 10/12/2021 88  70 - 99 mg/dL Final   BUN 10/12/2021 18  6 - 23 mg/dL Final   Creatinine, Ser 10/12/2021 1.00  0.40 - 1.50 mg/dL Final   Total Bilirubin 10/12/2021 0.7  0.2 - 1.2 mg/dL Final   Alkaline Phosphatase 10/12/2021 55  39 - 117 U/L Final   AST 10/12/2021 26  0 - 37 U/L Final   ALT 10/12/2021 34  0 - 53 U/L Final   Total Protein 10/12/2021 7.2  6.0 - 8.3 g/dL Final   Albumin 10/12/2021 4.7  3.5 - 5.2 g/dL Final   GFR 10/12/2021 83.34  >60.00 mL/min Final   Calculated using the CKD-EPI Creatinine Equation (2021)   Calcium 10/12/2021 9.9  8.4 - 10.5 mg/dL Final   Hgb A1c MFr Bld 10/12/2021 7.3 (H)  4.6 - 6.5 % Final   Glycemic Control Guidelines for People with Diabetes:Non Diabetic:  <6%Goal of Therapy: <7%Additional Action Suggested:  >8%        Allergies as of 10/20/2021       Reactions   Penicillins Anaphylaxis   Influenza Vac Split Quad    cellulitis   Pneumococcal Vaccines Other (See Comments)   Cellulitis, swelling        Medication List        Accurate as of October 20, 2021  3:26 PM. If you have any questions, ask your nurse or doctor.          allopurinol 100 MG tablet Commonly known as: ZYLOPRIM Take 1 tablet (100 mg total) by mouth 2 (two) times daily as needed. For gout   gabapentin 300 MG capsule Commonly known as: NEURONTIN Take 1 capsule 3 times a day as needed   Insulin Pen Needle 32G X 4 MM Misc Use to inject insulin once daily   Jardiance 25 MG Tabs tablet Generic drug: empagliflozin TAKE 1 TABLET(25 MG) BY MOUTH DAILY   metFORMIN 500 MG 24 hr tablet Commonly known as:  GLUCOPHAGE-XR TAKE 2 TABLETS TWICE A DAY   OneTouch Delica Lancets 94H Misc Use to check blood sugar 2 times per day dx code E11.65   OneTouch Verio test strip Generic drug: glucose blood Use as instructed   Ozempic (1 MG/DOSE) 4 MG/3ML Sopn Generic drug: Semaglutide (1 MG/DOSE) INJECT 1MG  SUBCUTANEOUSLY EVERY 7 DAYS AS DIRECTED   pantoprazole 40  MG tablet Commonly known as: PROTONIX Take 1 tablet (40 mg total) by mouth daily.   rosuvastatin 10 MG tablet Commonly known as: CRESTOR TAKE 1 TABLET DAILY   Tresiba FlexTouch 100 UNIT/ML FlexTouch Pen Generic drug: insulin degludec INJECT 0.2ML (20 UNITS     TOTAL) SUBCUTANEOUSLY DAILY        Allergies:  Allergies  Allergen Reactions   Penicillins Anaphylaxis   Influenza Vac Split Quad     cellulitis   Pneumococcal Vaccines Other (See Comments)    Cellulitis, swelling    Past Medical History:  Diagnosis Date   Allergy    Bowel obstruction ([place]) 10/2011   Diabetes mellitus    Gout    Hx of adenomatous polyp of colon 01/22/2015   Hyperlipidemia    Hypertension    Umbilical hernia     Past Surgical History:  Procedure Laterality Date   APPENDECTOMY     1980s   COLONOSCOPY W/ POLYPECTOMY  01/2015   LUMBAR SPINE SURGERY  2018   dr Ronnald Ramp    Family History  Problem Relation Age of Onset   Diabetes Mother    Hyperlipidemia Father    Hypertension Father    Dementia Father    Colon cancer Neg Hx    Esophageal cancer Neg Hx    Stomach cancer Neg Hx    Rectal cancer Neg Hx     Social History:  reports that he has been smoking cigarettes and cigars. He has a 9.50 pack-year smoking history. He has never used smokeless tobacco. He reports current alcohol use. He reports that he does not use drugs.    Review of Systems   Hyperlipidemia: He has mixed hyperlipidemia with increased LDL and persistently low HDL  He has been able to take 10 mg Crestor every day without muscle aches Previously on Lipitor which caused muscle aches  Also has had low HDL and variable triglycerides  However his LDL is significantly higher even though he has taken his Crestor regularly, and this is from going off his diet over the last few weeks  Lab Results  Component Value Date   CHOL 203 (H) 10/12/2021   CHOL 178 03/09/2021   CHOL 225 (H) 11/04/2020   Lab Results  Component  Value Date   HDL 34.30 (L) 10/12/2021   HDL 34.40 (L) 03/09/2021   HDL 32.70 (L) 11/04/2020   Lab Results  Component Value Date   LDLCALC 133 (H) 10/12/2021   LDLCALC 117 (H) 03/09/2021   LDLCALC 78 04/18/2020   Lab Results  Component Value Date   TRIG 177.0 (H) 10/12/2021   TRIG 133.0 03/09/2021   TRIG 223.0 (H) 11/04/2020   Lab Results  Component Value Date   CHOLHDL 6 10/12/2021   CHOLHDL 5 03/09/2021   CHOLHDL 7 11/04/2020   Lab Results  Component Value Date   LDLDIRECT 92.0 06/10/2021   LDLDIRECT 160.0 11/04/2020   LDLDIRECT 141.0 09/11/2019     Neuropathy:   He has had painful burning sensation in his feet, mainly at night  Symptoms are minimal, still not needing gabapentin    Foot  exam was normal in 04/2020  Blood pressure: Has consistently normal readings   BP Readings from Last 3 Encounters:  10/20/21 132/82  06/17/21 114/80  03/12/21 118/78     LABS:   No visits with results within 1 Week(s) from this visit.  Latest known visit with results is:  Lab on 10/12/2021  Component Date Value Ref Range Status   Cholesterol 10/12/2021 203 (H)  0 - 200 mg/dL Final   ATP III Classification       Desirable:  < 200 mg/dL               Borderline High:  200 - 239 mg/dL          High:  > = 240 mg/dL   Triglycerides 10/12/2021 177.0 (H)  0.0 - 149.0 mg/dL Final   Normal:  <150 mg/dLBorderline High:  150 - 199 mg/dL   HDL 10/12/2021 34.30 (L)  >39.00 mg/dL Final   VLDL 10/12/2021 35.4  0.0 - 40.0 mg/dL Final   LDL Cholesterol 10/12/2021 133 (H)  0 - 99 mg/dL Final   Total CHOL/HDL Ratio 10/12/2021 6   Final                  Men          Women1/2 Average Risk     3.4          3.3Average Risk          5.0          4.42X Average Risk          9.6          7.13X Average Risk          15.0          11.0                       NonHDL 10/12/2021 168.89   Final   NOTE:  Non-HDL goal should be 30 mg/dL higher than patient's LDL goal (i.e. LDL goal of < 70 mg/dL, would  have non-HDL goal of < 100 mg/dL)   Sodium 10/12/2021 138  135 - 145 mEq/L Final   Potassium 10/12/2021 4.4  3.5 - 5.1 mEq/L Final   Chloride 10/12/2021 103  96 - 112 mEq/L Final   CO2 10/12/2021 24  19 - 32 mEq/L Final   Glucose, Bld 10/12/2021 88  70 - 99 mg/dL Final   BUN 10/12/2021 18  6 - 23 mg/dL Final   Creatinine, Ser 10/12/2021 1.00  0.40 - 1.50 mg/dL Final   Total Bilirubin 10/12/2021 0.7  0.2 - 1.2 mg/dL Final   Alkaline Phosphatase 10/12/2021 55  39 - 117 U/L Final   AST 10/12/2021 26  0 - 37 U/L Final   ALT 10/12/2021 34  0 - 53 U/L Final   Total Protein 10/12/2021 7.2  6.0 - 8.3 g/dL Final   Albumin 10/12/2021 4.7  3.5 - 5.2 g/dL Final   GFR 10/12/2021 83.34  >60.00 mL/min Final   Calculated using the CKD-EPI Creatinine Equation (2021)   Calcium 10/12/2021 9.9  8.4 - 10.5 mg/dL Final   Hgb A1c MFr Bld 10/12/2021 7.3 (H)  4.6 - 6.5 % Final   Glycemic Control Guidelines for People with Diabetes:Non Diabetic:  <6%Goal of Therapy: <7%Additional Action Suggested:  >8%     Physical Examination:  BP 132/82 (BP Location: Left Arm, Patient Position: Sitting, Cuff Size: Normal)    Pulse 100  Ht 5\' 10"  (1.778 m)    Wt 280 lb 12.8 oz (127.4 kg)    SpO2 95%    BMI 40.29 kg/m     ASSESSMENT/PLAN:  Diabetes type 2 with obesity   See history of present illness for detailed discussion of current diabetes management, blood sugar patterns and problems identified  He is on a regimen of basal insulin, Jardiance, Metformin and Ozempic 1 mg weekly  His A1c is up to 7.3 from 6.8  His blood sugars are poorly controlled because of going off his diet and lack of exercise, has gained 12 pounds As before his fasting readings are high when he checks them only in the morning although reportedly later in the day his blood sugars are near normal Not checking readings after meals More recently has been able to get his Ozempic a little more regularly  Plan:  He will check to see if his  pharmacy has a 2 mg Ozempic and let us know, this will likely improve his control and weight loss No change in insulin as yet However if his blood sugars before dinnertime are high he may need to increase his insulin Also discussed possibility of needing mealtime insulin if his blood sugars after meals are going up over about 180 Will be consistent with his meal planning with restriction of carbohydrates, fats and protein Regular walking for exercise Continue Jardiance   LIPIDS: Not controlled but likely from poor diet and will repeat labs on the next visit before increasing his Crestor dose    There are no Patient Instructions on file for this visit.    Elayne Snare 10/20/2021, 3:26 PM

## 2021-12-06 ENCOUNTER — Other Ambulatory Visit: Payer: Self-pay | Admitting: Endocrinology

## 2022-01-19 ENCOUNTER — Other Ambulatory Visit (INDEPENDENT_AMBULATORY_CARE_PROVIDER_SITE_OTHER): Payer: BC Managed Care – PPO

## 2022-01-19 DIAGNOSIS — E782 Mixed hyperlipidemia: Secondary | ICD-10-CM

## 2022-01-19 DIAGNOSIS — Z794 Long term (current) use of insulin: Secondary | ICD-10-CM

## 2022-01-19 DIAGNOSIS — E1165 Type 2 diabetes mellitus with hyperglycemia: Secondary | ICD-10-CM | POA: Diagnosis not present

## 2022-01-19 LAB — COMPREHENSIVE METABOLIC PANEL
ALT: 24 U/L (ref 0–53)
AST: 22 U/L (ref 0–37)
Albumin: 4.5 g/dL (ref 3.5–5.2)
Alkaline Phosphatase: 62 U/L (ref 39–117)
BUN: 19 mg/dL (ref 6–23)
CO2: 25 mEq/L (ref 19–32)
Calcium: 9.5 mg/dL (ref 8.4–10.5)
Chloride: 103 mEq/L (ref 96–112)
Creatinine, Ser: 1.04 mg/dL (ref 0.40–1.50)
GFR: 79.36 mL/min (ref >60.00)
Glucose, Bld: 114 mg/dL — ABNORMAL HIGH (ref 70–99)
Potassium: 4.7 mEq/L (ref 3.5–5.1)
Sodium: 138 mEq/L (ref 135–145)
Total Bilirubin: 0.6 mg/dL (ref 0.2–1.2)
Total Protein: 7.1 g/dL (ref 6.0–8.3)

## 2022-01-19 LAB — LIPID PANEL
Cholesterol: 197 mg/dL (ref 0–200)
HDL: 33.9 mg/dL — ABNORMAL LOW (ref >39.00)
LDL Cholesterol: 123 mg/dL — ABNORMAL HIGH (ref 0–99)
NonHDL: 163.2
Total CHOL/HDL Ratio: 6
Triglycerides: 200 mg/dL — ABNORMAL HIGH (ref 0.0–149.0)
VLDL: 40 mg/dL (ref 0.0–40.0)

## 2022-01-19 LAB — MICROALBUMIN / CREATININE URINE RATIO
Creatinine,U: 72.7 mg/dL
Microalb Creat Ratio: 1 mg/g (ref 0.0–30.0)
Microalb, Ur: <0.7 mg/dL (ref 0.0–1.9)

## 2022-01-19 LAB — HEMOGLOBIN A1C: Hgb A1c MFr Bld: 7.4 % — ABNORMAL HIGH (ref 4.6–6.5)

## 2022-01-21 ENCOUNTER — Ambulatory Visit: Payer: BC Managed Care – PPO | Admitting: Endocrinology

## 2022-01-25 ENCOUNTER — Encounter: Payer: Self-pay | Admitting: Endocrinology

## 2022-01-25 ENCOUNTER — Ambulatory Visit: Payer: BC Managed Care – PPO | Admitting: Endocrinology

## 2022-01-25 VITALS — BP 116/78 | HR 95 | Ht 70.0 in | Wt 268.0 lb

## 2022-01-25 DIAGNOSIS — E782 Mixed hyperlipidemia: Secondary | ICD-10-CM | POA: Diagnosis not present

## 2022-01-25 DIAGNOSIS — Z794 Long term (current) use of insulin: Secondary | ICD-10-CM | POA: Diagnosis not present

## 2022-01-25 DIAGNOSIS — E1165 Type 2 diabetes mellitus with hyperglycemia: Secondary | ICD-10-CM

## 2022-01-25 MED ORDER — OZEMPIC (2 MG/DOSE) 8 MG/3ML ~~LOC~~ SOPN: PEN_INJECTOR | SUBCUTANEOUS | 2 refills | Status: DC

## 2022-01-25 NOTE — Progress Notes (Signed)
? ? ?Patient ID: Greg Kane, male   DOB: [birth year], 56 y.o.   MRN: [id number] ? ? ? ?Reason for Appointment : Endocrinology follow up ? ?History of Present Illness  ?        ?DIABETES: He has type 2 diabetes mellitus, date of diagnosis 2008 ? ?Background information: Initial diagnosis was made incidentally and had only mild hyperglycemia. ?Initially was treated with metformin alone and then Actos was added ?He has not had any formal diabetes education and has had difficulty losing weight ?His blood sugars have been only fairly well controlled with A1c usually in the 7-8 range ?Because of inadequate control in 2013 he was given Victoza injection in addition. However Actos was stopped  ?He thinks this helped bring his blood sugars better controlled but helped his portion control only slightly ?He did not lose much weight with Victoza ?In 6/14 he was given Invokana in addition and his Victoza was resumed. ? ?RECENT history   ? ?Treatment regimen: Insulin:  Tresiba 20 units in a.m.; non-insulin drugs: Ozempic 1 mg weekly, Jardiance 25 mg, metformin 2000 mg ? ?A1c is 7.4, previously stable at 6.8  ? ?Recent blood sugar patterns and problems identified: ?He has been able to lose weight back to his baseline weight  ?He says that he is generally watching his diet even when he is not getting his Ozempic ?With cutting back on portions and calories weight has improved  ?Because of supply issues he has not had Ozempic consistently and has missed at least 2 to 3 weeks of medications twice in the last 3 months  ?When he is not taking Ozempic his blood sugars have been as high as 264 even in the morning  ?However even when he was having higher sugars he only increased his [place] by 2 to 3 units  ?When taking Ozempic consistently his morning sugars have been as low as 110 ?Otherwise his blood sugars after dinner are probably around 180 ?He has been overall somewhat more active but not doing a lot of formal  exercise ? ?Glucose monitoring: Verio ? ?Blood Glucose readings as above by recall ? ?Previous fasting range 130-192, 30-day average 153 ? ? ?Wt Readings from Last 3 Encounters:  ?01/25/22 268 lb (121.6 kg)  ?10/20/21 280 lb 12.8 oz (127.4 kg)  ?06/17/21 268 lb 12.8 oz (121.9 kg)  ? ?  ? ?Lab Results  ?Component Value Date  ? HGBA1C 7.4 (H) 01/19/2022  ? HGBA1C 7.3 (H) 10/12/2021  ? HGBA1C 6.8 (H) 06/10/2021  ? ?Lab Results  ?Component Value Date  ? MICROALBUR <0.7 01/19/2022  ? LDLCALC 123 (H) 01/19/2022  ? CREATININE 1.04 01/19/2022  ? ? ? ?Lab Results  ?Component Value Date  ? MICROALBUR <0.7 01/19/2022  ? ?Lab on 01/19/2022  ?Component Date Value Ref Range Status  ? Cholesterol 01/19/2022 197  0 - 200 mg/dL Final  ? ATP III Classification       Desirable:  < 200 mg/dL               Borderline High:  200 - 239 mg/dL          High:  > = 240 mg/dL  ? Triglycerides 01/19/2022 200.0 (H)  0.0 - 149.0 mg/dL Final  ? Normal:  <150 mg/dLBorderline High:  150 - 199 mg/dL  ? HDL 01/19/2022 33.90 (L)  >39.00 mg/dL Final  ? VLDL 01/19/2022 40.0  0.0 - 40.0 mg/dL Final  ? LDL Cholesterol 01/19/2022 123 (H)  0 - 99 mg/dL Final  ? Total CHOL/HDL Ratio 01/19/2022 6   Final  ?                Men          Women1/2 Average Risk     3.4          3.3Average Risk          5.0          4.42X Average Risk          9.6          7.13X Average Risk          15.0          11.0                      ? NonHDL 01/19/2022 163.20   Final  ? NOTE:  Non-HDL goal should be 30 mg/dL higher than patient's LDL goal (i.e. LDL goal of < 70 mg/dL, would have non-HDL goal of < 100 mg/dL)  ? Microalb, Ur 01/19/2022 <0.7  0.0 - 1.9 mg/dL Final  ? Creatinine,U 01/19/2022 72.7  mg/dL Final  ? Microalb Creat Ratio 01/19/2022 1.0  0.0 - 30.0 mg/g Final  ? Sodium 01/19/2022 138  135 - 145 mEq/L Final  ? Potassium 01/19/2022 4.7  3.5 - 5.1 mEq/L Final  ? Chloride 01/19/2022 103  96 - 112 mEq/L Final  ? CO2 01/19/2022 25  19 - 32 mEq/L Final  ? Glucose, Bld  01/19/2022 114 (H)  70 - 99 mg/dL Final  ? BUN 01/19/2022 19  6 - 23 mg/dL Final  ? Creatinine, Ser 01/19/2022 1.04  0.40 - 1.50 mg/dL Final  ? Total Bilirubin 01/19/2022 0.6  0.2 - 1.2 mg/dL Final  ? Alkaline Phosphatase 01/19/2022 62  39 - 117 U/L Final  ? AST 01/19/2022 22  0 - 37 U/L Final  ? ALT 01/19/2022 24  0 - 53 U/L Final  ? Total Protein 01/19/2022 7.1  6.0 - 8.3 g/dL Final  ? Albumin 01/19/2022 4.5  3.5 - 5.2 g/dL Final  ? GFR 01/19/2022 79.36  >60.00 mL/min Final  ? Calculated using the CKD-EPI Creatinine Equation (2021)  ? Calcium 01/19/2022 9.5  8.4 - 10.5 mg/dL Final  ? Hgb A1c MFr Bld 01/19/2022 7.4 (H)  4.6 - 6.5 % Final  ? Glycemic Control Guidelines for People with Diabetes:Non Diabetic:  <6%Goal of Therapy: <7%Additional Action Suggested:  >8%   ? ? ?   ?Allergies as of 01/25/2022   ? ?   Reactions  ? Penicillins Anaphylaxis  ? Influenza Vac Split Quad   ? cellulitis  ? Pneumococcal Vaccines Other (See Comments)  ? Cellulitis, swelling  ? ?  ? ?  ?Medication List  ?  ? ?  ? Accurate as of January 25, 2022 11:59 PM. If you have any questions, ask your nurse or doctor.  ?  ?  ? ?  ? ?allopurinol 100 MG tablet ?Commonly known as: ZYLOPRIM ?Take 1 tablet (100 mg total) by mouth 2 (two) times daily as needed. For gout ?  ?gabapentin 300 MG capsule ?Commonly known as: NEURONTIN ?Take 1 capsule 3 times a day as needed ?  ?Insulin Pen Needle 32G X 4 MM Misc ?Use to inject insulin once daily ?  ?Jardiance 25 MG Tabs tablet ?Generic drug: empagliflozin ?TAKE 1 TABLET(25 MG) BY MOUTH DAILY ?  ?metFORMIN 500 MG 24 hr tablet ?Commonly  known as: GLUCOPHAGE-XR ?TAKE 2 TABLETS TWICE A DAY ?  ?OneTouch Delica Lancets 84O Misc ?Use to check blood sugar 2 times per day dx code E11.65 ?  ?OneTouch Verio test strip ?Generic drug: glucose blood ?Use as instructed ?  ?Ozempic (1 MG/DOSE) 4 MG/3ML Sopn ?Generic drug: Semaglutide (1 MG/DOSE) ?INJECT '1MG'$  SUBCUTANEOUSLY EVERY 7 DAYS AS DIRECTED ?What changed: Another  medication with the same name was added. Make sure you understand how and when to take each. ?Changed by: Elayne Snare, MD ?  ?Ozempic (2 MG/DOSE) 8 MG/3ML Sopn ?Generic drug: Semaglutide (2 MG/DOSE) ?Inject 2 mg weekly ?What changed: You were already taking a medication with the same name, and this prescription was added. Make sure you understand how and when to take each. ?Changed by: Elayne Snare, MD ?  ?pantoprazole 40 MG tablet ?Commonly known as: PROTONIX ?Take 1 tablet (40 mg total) by mouth daily. ?  ?rosuvastatin 10 MG tablet ?Commonly known as: CRESTOR ?TAKE 1 TABLET DAILY ?  ?Tyler Aas FlexTouch 100 UNIT/ML FlexTouch Pen ?Generic drug: insulin degludec ?INJECT 0.2ML (20 UNITS     TOTAL) SUBCUTANEOUSLY DAILY ?  ? ?  ? ? ?Allergies:  ?Allergies  ?Allergen Reactions  ? Penicillins Anaphylaxis  ? Influenza Vac Split Quad   ?  cellulitis  ? Pneumococcal Vaccines Other (See Comments)  ?  Cellulitis, swelling  ? ? ?Past Medical History:  ?Diagnosis Date  ? Allergy   ? Bowel obstruction ([place]) 10/2011  ? Diabetes mellitus   ? Gout   ? Hx of adenomatous polyp of colon 01/22/2015  ? Hyperlipidemia   ? Hypertension   ? Umbilical hernia   ? ? ?Past Surgical History:  ?Procedure Laterality Date  ? APPENDECTOMY    ? 1980s  ? COLONOSCOPY W/ POLYPECTOMY  01/2015  ? LUMBAR SPINE SURGERY  2018  ? dr Ronnald Ramp  ? ? ?Family History  ?Problem Relation Age of Onset  ? Diabetes Mother   ? Hyperlipidemia Father   ? Hypertension Father   ? Dementia Father   ? Colon cancer Neg Hx   ? Esophageal cancer Neg Hx   ? Stomach cancer Neg Hx   ? Rectal cancer Neg Hx   ? ? ?Social History:  reports that he has been smoking cigarettes and cigars. He has a 9.50 pack-year smoking history. He has never used smokeless tobacco. He reports current alcohol use. He reports that he does not use drugs. ? ?  ?Review of Systems  ? ?Hyperlipidemia: He has mixed hyperlipidemia with increased LDL and persistently low HDL ?Previously on Lipitor which caused muscle  aches ? ?He has been able to take 10 mg Crestor every day without muscle aches ?However not clear why his LDL is still relatively high even though he thinks overall he is watching his diet ? ?Also has had low HDL and relatively hig

## 2022-01-25 NOTE — Patient Instructions (Addendum)
Call re: [facility] ? ?Take '20mg'$  Crestor daily ?

## 2022-03-24 ENCOUNTER — Other Ambulatory Visit: Payer: Self-pay | Admitting: Endocrinology

## 2022-03-27 ENCOUNTER — Other Ambulatory Visit: Payer: Self-pay | Admitting: Endocrinology

## 2022-04-14 ENCOUNTER — Other Ambulatory Visit: Payer: Self-pay

## 2022-04-14 MED ORDER — EMPAGLIFLOZIN 25 MG PO TABS: ORAL_TABLET | ORAL | 3 refills | Status: DC

## 2022-04-26 ENCOUNTER — Telehealth: Payer: Self-pay | Admitting: Endocrinology

## 2022-04-26 ENCOUNTER — Other Ambulatory Visit (INDEPENDENT_AMBULATORY_CARE_PROVIDER_SITE_OTHER): Payer: BC Managed Care – PPO

## 2022-04-26 DIAGNOSIS — E1165 Type 2 diabetes mellitus with hyperglycemia: Secondary | ICD-10-CM | POA: Diagnosis not present

## 2022-04-26 DIAGNOSIS — E782 Mixed hyperlipidemia: Secondary | ICD-10-CM | POA: Diagnosis not present

## 2022-04-26 DIAGNOSIS — Z794 Long term (current) use of insulin: Secondary | ICD-10-CM | POA: Diagnosis not present

## 2022-04-26 LAB — COMPREHENSIVE METABOLIC PANEL
ALT: 24 U/L (ref 0–53)
AST: 23 U/L (ref 0–37)
Albumin: 4.7 g/dL (ref 3.5–5.2)
Alkaline Phosphatase: 49 U/L (ref 39–117)
BUN: 17 mg/dL (ref 6–23)
CO2: 22 mEq/L (ref 19–32)
Calcium: 9.6 mg/dL (ref 8.4–10.5)
Chloride: 104 mEq/L (ref 96–112)
Creatinine, Ser: 1 mg/dL (ref 0.40–1.50)
GFR: 83.02 mL/min (ref >60.00)
Glucose, Bld: 115 mg/dL — ABNORMAL HIGH (ref 70–99)
Potassium: 4.3 mEq/L (ref 3.5–5.1)
Sodium: 136 mEq/L (ref 135–145)
Total Bilirubin: 0.8 mg/dL (ref 0.2–1.2)
Total Protein: 7.1 g/dL (ref 6.0–8.3)

## 2022-04-26 LAB — LIPID PANEL
Cholesterol: 134 mg/dL (ref 0–200)
HDL: 33.1 mg/dL — ABNORMAL LOW (ref >39.00)
LDL Cholesterol: 71 mg/dL (ref 0–99)
NonHDL: 101.16
Total CHOL/HDL Ratio: 4
Triglycerides: 153 mg/dL — ABNORMAL HIGH (ref 0.0–149.0)
VLDL: 30.6 mg/dL (ref 0.0–40.0)

## 2022-04-26 LAB — HEMOGLOBIN A1C: Hgb A1c MFr Bld: 6.8 % — ABNORMAL HIGH (ref 4.6–6.5)

## 2022-04-26 MED ORDER — OZEMPIC (2 MG/DOSE) 8 MG/3ML ~~LOC~~ SOPN: PEN_INJECTOR | SUBCUTANEOUS | 2 refills | Status: DC

## 2022-04-26 NOTE — Telephone Encounter (Signed)
RX now sent to patient's preferred pharmacy 

## 2022-04-26 NOTE — Telephone Encounter (Signed)
MEDICATION: OZEMPIC, 2 MG  PHARMACY:  [facility] at [facility] CONTACTED [place]?  yes  IS THIS A 90 DAY SUPPLY : unknown  IS PATIENT OUT OF MEDICATION: yes  IF NOT; HOW MUCH IS LEFT:   LAST APPOINTMENT DATE: '@7'$ /09/2022  NEXT APPOINTMENT DATE:'@7'$ /24/2023  DO WE HAVE YOUR PERMISSION TO LEAVE A DETAILED MESSAGE?: YES  OTHER COMMENTS:

## 2022-04-29 ENCOUNTER — Ambulatory Visit: Payer: BC Managed Care – PPO | Admitting: Endocrinology

## 2022-04-29 VITALS — BP 112/76 | HR 97 | Ht 70.5 in | Wt 265.6 lb

## 2022-04-29 DIAGNOSIS — E782 Mixed hyperlipidemia: Secondary | ICD-10-CM

## 2022-04-29 DIAGNOSIS — Z794 Long term (current) use of insulin: Secondary | ICD-10-CM

## 2022-04-29 DIAGNOSIS — E1165 Type 2 diabetes mellitus with hyperglycemia: Secondary | ICD-10-CM | POA: Diagnosis not present

## 2022-04-29 MED ORDER — DEXCOM G7 SENSOR MISC
1.0000 | 3 refills | Status: DC
Start: 2022-04-29 — End: 2022-10-19

## 2022-04-29 NOTE — Patient Instructions (Addendum)
Check blood sugars on waking up 3 days a week  Also check blood sugars about 2 hours after meals and do this after different meals by rotation  Recommended blood sugar levels on waking up are 90-130 and about 2 hours after meal is 130-160  Please bring your blood sugar monitor to each visit, thank you  Dexcom app: Clarity app.  Crestor '20mg'$  Rx

## 2022-04-29 NOTE — Progress Notes (Signed)
Patient ID: Greg Kane, male   DOB: June 25, 1966, 56 y.o.   MRN: [id number]    Reason for Appointment : Endocrinology follow up  History of Present Illness          DIABETES: He has type 2 diabetes mellitus, date of diagnosis 2008  Background information: Initial diagnosis was made incidentally and had only mild hyperglycemia. Initially was treated with metformin alone and then Actos was added He has not had any formal diabetes education and has had difficulty losing weight His blood sugars have been only fairly well controlled with A1c usually in the 7-8 range Because of inadequate control in 2013 he was given Victoza injection in addition. However Actos was stopped  He thinks this helped bring his blood sugars better controlled but helped his portion control only slightly He did not lose much weight with Victoza In 6/14 he was given Invokana in addition and his Victoza was resumed.  RECENT history    Treatment regimen: Insulin:  Tresiba 20 units in a.m.; non-insulin drugs: Ozempic 1 mg weekly, Jardiance 25 mg, metformin 2000 mg  A1c is much improved at 6.8 compared to 7.4,  Recent blood sugar patterns and problems identified: He has been able to get his Ozempic more consistently now Also with increasing the dose to 2 mg weekly since his last visit in 4/23 he likely has had overall better control In the last few days he has not been able to refill his Ozempic but he says that he has tried to watch his diet more carefully  Although his diet can be variable and also he may not have lost it while on vacation  He is generally fairly active at work and not doing a lot of formal exercise currently  However has been able to maintain his weight lower than before  As before his fasting readings are mostly high although variable a little in the last 3-4 readings have been in the 130-140s, generally checking fasting readings early morning Although he does not check his sugar later  in the day he thinks that occasionally before dinnertime he feels slight hypoglycemic symptoms relieved by snack  Lab glucose late morning was 115  Glucose monitoring: Verio  Blood Glucose readings from download   PRE-MEAL Fasting Lunch Dinner Bedtime Overall  Glucose range: 131-200      Mean/median:     151    Previous fasting range 130-192, 30-day average 153   Wt Readings from Last 3 Encounters:  04/29/22 265 lb 9.6 oz (120.5 kg)  01/25/22 268 lb (121.6 kg)  10/20/21 280 lb 12.8 oz (127.4 kg)      Lab Results  Component Value Date   HGBA1C 6.8 (H) 04/26/2022   HGBA1C 7.4 (H) 01/19/2022   HGBA1C 7.3 (H) 10/12/2021   Lab Results  Component Value Date   MICROALBUR <0.7 01/19/2022   [place] 71 04/26/2022   CREATININE 1.00 04/26/2022     Lab Results  Component Value Date   MICROALBUR <0.7 01/19/2022   Lab on 04/26/2022  Component Date Value Ref Range Status   Cholesterol 04/26/2022 134  0 - 200 mg/dL Final   ATP III Classification       Desirable:  < 200 mg/dL               Borderline High:  200 - 239 mg/dL          High:  > = 240 mg/dL   Triglycerides 04/26/2022 153.0 (H)  0.0 -  149.0 mg/dL Final   Normal:  <150 mg/dLBorderline High:  150 - 199 mg/dL   HDL 04/26/2022 33.10 (L)  >39.00 mg/dL Final   VLDL 04/26/2022 30.6  0.0 - 40.0 mg/dL Final   LDL Cholesterol 04/26/2022 71  0 - 99 mg/dL Final   Total CHOL/HDL Ratio 04/26/2022 4   Final                  Men          Women1/2 Average Risk     3.4          3.3Average Risk          5.0          4.42X Average Risk          9.6          7.13X Average Risk          15.0          11.0                       NonHDL 04/26/2022 101.16   Final   NOTE:  Non-HDL goal should be 30 mg/dL higher than patient's LDL goal (i.e. LDL goal of < 70 mg/dL, would have non-HDL goal of < 100 mg/dL)   Sodium 04/26/2022 136  135 - 145 mEq/L Final   Potassium 04/26/2022 4.3  3.5 - 5.1 mEq/L Final   Chloride 04/26/2022 104  96 - 112 mEq/L  Final   CO2 04/26/2022 22  19 - 32 mEq/L Final   Glucose, Bld 04/26/2022 115 (H)  70 - 99 mg/dL Final   BUN 04/26/2022 17  6 - 23 mg/dL Final   Creatinine, Ser 04/26/2022 1.00  0.40 - 1.50 mg/dL Final   Total Bilirubin 04/26/2022 0.8  0.2 - 1.2 mg/dL Final   Alkaline Phosphatase 04/26/2022 49  39 - 117 U/L Final   AST 04/26/2022 23  0 - 37 U/L Final   ALT 04/26/2022 24  0 - 53 U/L Final   Total Protein 04/26/2022 7.1  6.0 - 8.3 g/dL Final   Albumin 04/26/2022 4.7  3.5 - 5.2 g/dL Final   GFR 04/26/2022 83.02  >60.00 mL/min Final   Calculated using the CKD-EPI Creatinine Equation (2021)   Calcium 04/26/2022 9.6  8.4 - 10.5 mg/dL Final   Hgb A1c MFr Bld 04/26/2022 6.8 (H)  4.6 - 6.5 % Final   Glycemic Control Guidelines for People with Diabetes:Non Diabetic:  <6%Goal of Therapy: <7%Additional Action Suggested:  >8%        Allergies as of 04/29/2022       Reactions   Penicillins Anaphylaxis   Influenza Vac Split Quad    cellulitis   Pneumococcal Vaccines Other (See Comments)   Cellulitis, swelling        Medication List        Accurate as of April 29, 2022 11:59 PM. If you have any questions, ask your nurse or doctor.          allopurinol 100 MG tablet Commonly known as: ZYLOPRIM Take 1 tablet (100 mg total) by mouth 2 (two) times daily as needed. For gout   Dexcom G7 Sensor Misc 1 Device by Does not apply route as directed. Change sensor every 10 days   empagliflozin 25 MG Tabs tablet Commonly known as: Jardiance TAKE 1 TABLET(25 MG) BY MOUTH DAILY   gabapentin 300 MG capsule Commonly known as: NEURONTIN Take 1 capsule  3 times a day as needed   Insulin Pen Needle 32G X 4 MM Misc Use to inject insulin once daily   metFORMIN 500 MG 24 hr tablet Commonly known as: GLUCOPHAGE-XR TAKE 2 TABLETS TWICE A DAY   OneTouch Delica Lancets 16S Misc Use to check blood sugar 2 times per day dx code E11.65   OneTouch Verio test strip Generic drug: glucose blood Use as  instructed   Ozempic (2 MG/DOSE) 8 MG/3ML Sopn Generic drug: Semaglutide (2 MG/DOSE) Inject 2 mg weekly What changed: Another medication with the same name was removed. Continue taking this medication, and follow the directions you see here.   pantoprazole 40 MG tablet Commonly known as: PROTONIX Take 1 tablet (40 mg total) by mouth daily.   rosuvastatin 10 MG tablet Commonly known as: CRESTOR TAKE 1 TABLET DAILY   Tresiba FlexTouch 100 UNIT/ML FlexTouch Pen Generic drug: insulin degludec INJECT 0.2ML (20 UNITS     TOTAL) SUBCUTANEOUSLY DAILY        Allergies:  Allergies  Allergen Reactions   Penicillins Anaphylaxis   Influenza Vac Split Quad     cellulitis   Pneumococcal Vaccines Other (See Comments)    Cellulitis, swelling    Past Medical History:  Diagnosis Date   Allergy    Bowel obstruction ([place]) 10/2011   Diabetes mellitus    Gout    Hx of adenomatous polyp of colon 01/22/2015   Hyperlipidemia    Hypertension    Umbilical hernia     Past Surgical History:  Procedure Laterality Date   APPENDECTOMY     1980s   COLONOSCOPY W/ POLYPECTOMY  01/2015   LUMBAR SPINE SURGERY  2018   dr Ronnald Ramp    Family History  Problem Relation Age of Onset   Diabetes Mother    Hyperlipidemia Father    Hypertension Father    Dementia Father    Colon cancer Neg Hx    Esophageal cancer Neg Hx    Stomach cancer Neg Hx    Rectal cancer Neg Hx     Social History:  reports that he has been smoking cigarettes and cigars. He has a 9.50 pack-year smoking history. He has never used smokeless tobacco. He reports current alcohol use. He reports that he does not use drugs.    Review of Systems   Hyperlipidemia: He has mixed hyperlipidemia with increased LDL and persistently low HDL Previously on Lipitor which caused muscle aches  He has been able to take 10 mg Crestor every day without muscle aches However not clear why his LDL is still relatively high even though he thinks  overall he is watching his diet  Also has had low HDL and relatively higher triglycerides More recently his weight has come down and concomitant glucose was 114  Lab Results  Component Value Date   CHOL 134 04/26/2022   CHOL 197 01/19/2022   CHOL 203 (H) 10/12/2021   Lab Results  Component Value Date   HDL 33.10 (L) 04/26/2022   HDL 33.90 (L) 01/19/2022   HDL 34.30 (L) 10/12/2021   Lab Results  Component Value Date   LDLCALC 71 04/26/2022   LDLCALC 123 (H) 01/19/2022   LDLCALC 133 (H) 10/12/2021   Lab Results  Component Value Date   TRIG 153.0 (H) 04/26/2022   TRIG 200.0 (H) 01/19/2022   TRIG 177.0 (H) 10/12/2021   Lab Results  Component Value Date   CHOLHDL 4 04/26/2022   CHOLHDL 6 01/19/2022   CHOLHDL 6  10/12/2021   Lab Results  Component Value Date   LDLDIRECT 92.0 06/10/2021   LDLDIRECT 160.0 11/04/2020   LDLDIRECT 141.0 09/11/2019     Neuropathy:   He has had painful burning sensation in his feet, mainly at night  Symptoms are minimal, still not needing gabapentin    Foot exam was normal in 04/2020  Blood pressure: Has consistently normal readings   BP Readings from Last 3 Encounters:  04/29/22 112/76  01/25/22 116/78  10/20/21 132/82     LABS:   Lab on 04/26/2022  Component Date Value Ref Range Status   Cholesterol 04/26/2022 134  0 - 200 mg/dL Final   ATP III Classification       Desirable:  < 200 mg/dL               Borderline High:  200 - 239 mg/dL          High:  > = 240 mg/dL   Triglycerides 04/26/2022 153.0 (H)  0.0 - 149.0 mg/dL Final   Normal:  <150 mg/dLBorderline High:  150 - 199 mg/dL   HDL 04/26/2022 33.10 (L)  >39.00 mg/dL Final   VLDL 04/26/2022 30.6  0.0 - 40.0 mg/dL Final   LDL Cholesterol 04/26/2022 71  0 - 99 mg/dL Final   Total CHOL/HDL Ratio 04/26/2022 4   Final                  Men          Women1/2 Average Risk     3.4          3.3Average Risk          5.0          4.42X Average Risk          9.6          7.13X Average  Risk          15.0          11.0                       NonHDL 04/26/2022 101.16   Final   NOTE:  Non-HDL goal should be 30 mg/dL higher than patient's LDL goal (i.e. LDL goal of < 70 mg/dL, would have non-HDL goal of < 100 mg/dL)   Sodium 04/26/2022 136  135 - 145 mEq/L Final   Potassium 04/26/2022 4.3  3.5 - 5.1 mEq/L Final   Chloride 04/26/2022 104  96 - 112 mEq/L Final   CO2 04/26/2022 22  19 - 32 mEq/L Final   Glucose, Bld 04/26/2022 115 (H)  70 - 99 mg/dL Final   BUN 04/26/2022 17  6 - 23 mg/dL Final   Creatinine, Ser 04/26/2022 1.00  0.40 - 1.50 mg/dL Final   Total Bilirubin 04/26/2022 0.8  0.2 - 1.2 mg/dL Final   Alkaline Phosphatase 04/26/2022 49  39 - 117 U/L Final   AST 04/26/2022 23  0 - 37 U/L Final   ALT 04/26/2022 24  0 - 53 U/L Final   Total Protein 04/26/2022 7.1  6.0 - 8.3 g/dL Final   Albumin 04/26/2022 4.7  3.5 - 5.2 g/dL Final   GFR 04/26/2022 83.02  >60.00 mL/min Final   Calculated using the CKD-EPI Creatinine Equation (2021)   Calcium 04/26/2022 9.6  8.4 - 10.5 mg/dL Final   Hgb A1c MFr Bld 04/26/2022 6.8 (H)  4.6 - 6.5 % Final   Glycemic Control Guidelines for People  with Diabetes:Non Diabetic:  <6%Goal of Therapy: <7%Additional Action Suggested:  >8%     Physical Examination:  BP 112/76   Pulse 97   Ht 5' 10.5" (1.791 m)   Wt 265 lb 9.6 oz (120.5 kg)   SpO2 90%   BMI 37.57 kg/m     ASSESSMENT/PLAN:  Diabetes type 2 with obesity   See history of present illness for detailed discussion of current diabetes management, blood sugar patterns and problems identified  He is on a regimen of basal insulin, Jardiance, Metformin and Ozempic 1 mg weekly  His A1c is improved back to 6.8  His blood sugars are consistently controlled with going up to 2 mg Ozempic and getting this more regularly at the pharmacy Weight has been maintained He has overall fairly good compliance with diet but can likely do better with exercise  LIPIDS: LDL is excellent at 71 with  using 20 mg Crestor which he is tolerating well Liver function stable  Blood pressure and renal function stable, only on Jardiance  Plan:  Discussed blood sugar targets both before and after meals and need to check readings after dinner consistently  He will try to have regular exercise program  No change in medications He would prefer to use a CGM and we will try to get him the Dexcom if covered by his insurance; this will also give him the option of wearing a sensor on his abdomen Patient information brochure given    Patient Instructions  Check blood sugars on waking up 3 days a week  Also check blood sugars about 2 hours after meals and do this after different meals by rotation  Recommended blood sugar levels on waking up are 90-130 and about 2 hours after meal is 130-160  Please bring your blood sugar monitor to each visit, thank you  Dexcom app: Clarity app.  Crestor '20mg'$  Rx     Elayne Snare 04/30/2022, 8:43 AM

## 2022-05-05 ENCOUNTER — Telehealth: Payer: Self-pay | Admitting: Family Medicine

## 2022-05-05 NOTE — Telephone Encounter (Signed)
Form received. Pt has appt tomorrow

## 2022-05-05 NOTE — Telephone Encounter (Signed)
Patient's dentistry called [place] in regards of a forms faxed over to [place] back on July 17th. The Health and physical form faxed over to [place] is needed due to the patient being put under anesthesia on the 17th of Aug. Informed dentistry that patient had not been seen since March of 2022 and an appointment might be needed to fill out the form. Dentistry re-faxed forms over to [place] again and the patient will be called to make aware that an appointment is needed. Forms received and placed on provider's box.

## 2022-05-06 ENCOUNTER — Encounter: Payer: Self-pay | Admitting: Family Medicine

## 2022-05-06 ENCOUNTER — Ambulatory Visit: Payer: BC Managed Care – PPO | Admitting: Family Medicine

## 2022-05-06 ENCOUNTER — Other Ambulatory Visit (HOSPITAL_COMMUNITY): Payer: Self-pay

## 2022-05-06 ENCOUNTER — Telehealth: Payer: Self-pay

## 2022-05-06 VITALS — BP 110/72 | HR 96 | Temp 98.4°F | Resp 18 | Ht 70.5 in | Wt 268.6 lb

## 2022-05-06 DIAGNOSIS — Z01818 Encounter for other preprocedural examination: Secondary | ICD-10-CM | POA: Diagnosis not present

## 2022-05-06 DIAGNOSIS — I10 Essential (primary) hypertension: Secondary | ICD-10-CM

## 2022-05-06 DIAGNOSIS — E782 Mixed hyperlipidemia: Secondary | ICD-10-CM | POA: Diagnosis not present

## 2022-05-06 DIAGNOSIS — E119 Type 2 diabetes mellitus without complications: Secondary | ICD-10-CM | POA: Diagnosis not present

## 2022-05-06 NOTE — Assessment & Plan Note (Addendum)
hgba1c acceptable, minimize simple carbs. Increase exercise as tolerated. Continue current meds Lab Results  Component Value Date   HGBA1C 6.8 (H) 04/26/2022

## 2022-05-06 NOTE — Assessment & Plan Note (Signed)
Cleared for dential procedure

## 2022-05-06 NOTE — Telephone Encounter (Signed)
Forms faxed

## 2022-05-06 NOTE — Assessment & Plan Note (Signed)
Tolerating statin, encouraged heart healthy diet, avoid trans fats, minimize simple carbs and saturated fats. Increase exercise as tolerated 

## 2022-05-06 NOTE — Progress Notes (Signed)
Established Patient Office Visit  Subjective   Patient ID: Greg Kane, male    DOB: April 30, 1966  Age: 56 y.o. MRN: [id number]  Chief Complaint  Patient presents with   Pre-op Exam    HPI Pt here to have clearance for dental procedure.  He is having multiple extractions under anesthesia.  No complaints   Patient Active Problem List   Diagnosis Date Noted   Pre-op evaluation 05/06/2022   Rectal bleeding 12/11/2020   Dyspepsia 12/11/2020   Abdominal bloating 12/11/2020   Right lower quadrant abdominal pain 35/57/3220   Umbilical hernia without obstruction and without gangrene 06/25/2020   Hx of adenomatous polyp of colon 01/22/2015   Preventative health care 11/01/2014   Obesity (BMI 30-39.9) 06/11/2013   Type II or unspecified type diabetes mellitus without mention of complication, uncontrolled 05/10/2013   Conjunctivitis 05/03/2013   Stye 05/03/2013   UTI (lower urinary tract infection) 10/15/2011   Urinary retention 10/13/2011   Constipation 10/13/2011   Proteinuria 06/14/2011   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   MYALGIA 06/20/2008   Essential hypertension 03/25/2008   BACK PAIN 10/24/2007   PARESTHESIA 10/24/2007   Diabetes mellitus without complication ([place]) 25/42/7062   Mixed hyperlipidemia 05/03/2007   DERMATITIS NOS 05/03/2007   ALLERGIC RHINITIS 02/21/2007   Past Medical History:  Diagnosis Date   Allergy    Bowel obstruction ([place]) 10/2011   Diabetes mellitus    Gout    Hx of adenomatous polyp of colon 01/22/2015   Hyperlipidemia    Hypertension    Umbilical hernia    Past Surgical History:  Procedure Laterality Date   APPENDECTOMY     1980s   COLONOSCOPY W/ POLYPECTOMY  01/2015   LUMBAR SPINE SURGERY  2018   dr Ronnald Ramp   Social History   Tobacco Use   Smoking status: Former    Packs/day: 0.25    Years: 38.00    Total pack years: 9.50    Types: Cigarettes, Cigars    Quit date: 04/29/2022    Years since quitting: 0.0   Smokeless tobacco:  Former   Tobacco comments:    pt doesn't buy cig-- borrows from others no more than 4 cig a week---quit 1 week ago (July 2023)  Substance Use Topics   Alcohol use: Yes    Alcohol/week: 0.0 standard drinks of alcohol    Comment: occas   Drug use: No   Social History   Socioeconomic History   Marital status: Married    Spouse name: Not on file   Number of children: Not on file   Years of education: Not on file   Highest education level: Not on file  Occupational History   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  Tobacco Use   Smoking status: Former    Packs/day: 0.25    Years: 38.00    Total pack years: 9.50    Types: Cigarettes, Cigars    Quit date: 04/29/2022    Years since quitting: 0.0   Smokeless tobacco: Former   Tobacco comments:    pt doesn't buy cig-- borrows from others no more than 4 cig a week---quit 1 week ago (July 2023)  Substance and Sexual Activity   Alcohol use: Yes    Alcohol/week: 0.0 standard drinks of alcohol    Comment: occas   Drug use: No   Sexual activity: Yes  Other Topics Concern   Not on file  Social History Narrative   Married   Children 3   Engineer, production  Exercise -a little      Social Determinants of Radio broadcast assistant Strain: Not on file  Food Insecurity: Not on file  Transportation Needs: Not on file  Physical Activity: Not on file  Stress: Not on file  Social Connections: Not on file  Intimate Partner Violence: Not on file   Family Status  Relation Name Status   Mother  Deceased   Father  Deceased   Neg Hx  (Not Specified)   Family History  Problem Relation Age of Onset   Diabetes Mother    Hyperlipidemia Father    Hypertension Father    Dementia Father    Colon cancer Neg Hx    Esophageal cancer Neg Hx    Stomach cancer Neg Hx    Rectal cancer Neg Hx    Allergies  Allergen Reactions   Penicillins Anaphylaxis   Pneumococcal Vaccines Other (See Comments)    Cellulitis, swelling      Review of  Systems  Constitutional:  Negative for fever and malaise/fatigue.  HENT:  Negative for congestion.   Eyes:  Negative for blurred vision.  Respiratory:  Negative for shortness of breath.   Cardiovascular:  Negative for chest pain, palpitations and leg swelling.  Gastrointestinal:  Negative for abdominal pain, blood in stool and nausea.  Genitourinary:  Negative for dysuria and frequency.  Musculoskeletal:  Negative for falls.  Skin:  Negative for rash.  Neurological:  Negative for dizziness, loss of consciousness and headaches.  Endo/Heme/Allergies:  Negative for environmental allergies.  Psychiatric/Behavioral:  Negative for depression. The patient is not nervous/anxious.       Objective:     BP 110/72 (BP Location: Left Arm, Patient Position: Sitting, Cuff Size: Large)   Pulse 96   Temp 98.4 F (36.9 C) (Oral)   Resp 18   Ht 5' 10.5" (1.791 m)   Wt 268 lb 9.6 oz (121.8 kg)   SpO2 97%   BMI 38.00 kg/m  BP Readings from Last 3 Encounters:  05/06/22 110/72  04/29/22 112/76  01/25/22 116/78   Wt Readings from Last 3 Encounters:  05/06/22 268 lb 9.6 oz (121.8 kg)  04/29/22 265 lb 9.6 oz (120.5 kg)  01/25/22 268 lb (121.6 kg)   SpO2 Readings from Last 3 Encounters:  05/06/22 97%  04/29/22 90%  10/20/21 95%      Physical Exam Vitals and nursing note reviewed.  Constitutional:      Appearance: He is well-developed.  HENT:     Head: Normocephalic and atraumatic.  Eyes:     Pupils: Pupils are equal, round, and reactive to light.  Neck:     Thyroid: No thyromegaly.  Cardiovascular:     Rate and Rhythm: Normal rate and regular rhythm.     Heart sounds: No murmur heard. Pulmonary:     Effort: Pulmonary effort is normal. No respiratory distress.     Breath sounds: Normal breath sounds. No wheezing or rales.  Chest:     Chest wall: No tenderness.  Musculoskeletal:        General: No tenderness.     Cervical back: Normal range of motion and neck supple.  Skin:     General: Skin is warm and dry.  Neurological:     Mental Status: He is alert and oriented to person, place, and time.  Psychiatric:        Behavior: Behavior normal.        Thought Content: Thought content normal.  Judgment: Judgment normal.      No results found for any visits on 05/06/22.  Last CBC Lab Results  Component Value Date   WBC 11.6 (H) 12/11/2020   HGB 17.8 Repeated and verified X2. (H) 12/11/2020   HCT 52.5 (H) 12/11/2020   MCV 88.7 12/11/2020   MCH 30.8 06/24/2020   RDW 13.7 12/11/2020   PLT 226.0 53/29/9242   Last metabolic panel Lab Results  Component Value Date   GLUCOSE 115 (H) 04/26/2022   NA 136 04/26/2022   K 4.3 04/26/2022   CL 104 04/26/2022   CO2 22 04/26/2022   BUN 17 04/26/2022   CREATININE 1.00 04/26/2022   CALCIUM 9.6 04/26/2022   PROT 7.1 04/26/2022   ALBUMIN 4.7 04/26/2022   BILITOT 0.8 04/26/2022   ALKPHOS 49 04/26/2022   AST 23 04/26/2022   ALT 24 04/26/2022   Last lipids Lab Results  Component Value Date   CHOL 134 04/26/2022   HDL 33.10 (L) 04/26/2022   LDLCALC 71 04/26/2022   LDLDIRECT 92.0 06/10/2021   TRIG 153.0 (H) 04/26/2022   CHOLHDL 4 04/26/2022   Last hemoglobin A1c Lab Results  Component Value Date   HGBA1C 6.8 (H) 04/26/2022   Last thyroid functions Lab Results  Component Value Date   TSH 1.50 12/11/2020   Last vitamin D No results found for: "25OHVITD2", "25OHVITD3", "VD25OH" Last vitamin B12 and Folate No results found for: "VITAMINB12", "FOLATE"    The 10-year ASCVD risk score (Arnett DK, et al., 2019) is: 9.8%    Assessment & Plan:   Problem List Items Addressed This Visit       Unprioritized   Pre-op evaluation - Primary    Cleared for dential procedure      Relevant Orders   EKG 12-Lead (Completed)   Mixed hyperlipidemia    Tolerating statin, encouraged heart healthy diet, avoid trans fats, minimize simple carbs and saturated fats. Increase exercise as tolerated       Essential hypertension    Well controlled, no changes to meds. Encouraged heart healthy diet such as the DASH diet and exercise as tolerated.       Diabetes mellitus without complication (HCC)    ASTM1D acceptable, minimize simple carbs. Increase exercise as tolerated. Continue current meds Lab Results  Component Value Date   HGBA1C 6.8 (H) 04/26/2022          No follow-ups on file.    Ann Held, DO

## 2022-05-06 NOTE — Telephone Encounter (Signed)
Patient Advocate Encounter  Received a fax regarding Prior Authorization from [employer] for Dexcom G6.   Authorization has been DENIED due to criteria not met. This plan only covers a CGM when the patient is using an intensive insulin regimen.  Clista Bernhardt, CPhT Rx Patient Advocate Specialist Phone: [phone]

## 2022-05-06 NOTE — Assessment & Plan Note (Signed)
Well controlled, no changes to meds. Encouraged heart healthy diet such as the DASH diet and exercise as tolerated.  °

## 2022-05-06 NOTE — Telephone Encounter (Signed)
Dr.Byrd's office would like [place] to fax forms back to [phone] once they are completed.

## 2022-05-06 NOTE — Telephone Encounter (Signed)
Patient Advocate Encounter   Received notification from Kristopher [place]en[place] that prior authorization is required for Dexcom G7  Submitted: 05/06/22 Key [id number] Status is pending  Clista Bernhardt, CPhT Rx Patient Advocate Specialist Phone: [phone]

## 2022-07-23 ENCOUNTER — Other Ambulatory Visit: Payer: Self-pay | Admitting: Endocrinology

## 2022-07-23 DIAGNOSIS — E1165 Type 2 diabetes mellitus with hyperglycemia: Secondary | ICD-10-CM

## 2022-08-09 ENCOUNTER — Other Ambulatory Visit: Payer: Self-pay | Admitting: Endocrinology

## 2022-08-09 DIAGNOSIS — E1165 Type 2 diabetes mellitus with hyperglycemia: Secondary | ICD-10-CM

## 2022-08-30 ENCOUNTER — Other Ambulatory Visit (INDEPENDENT_AMBULATORY_CARE_PROVIDER_SITE_OTHER): Payer: BC Managed Care – PPO

## 2022-08-30 DIAGNOSIS — Z794 Long term (current) use of insulin: Secondary | ICD-10-CM

## 2022-08-30 DIAGNOSIS — E1165 Type 2 diabetes mellitus with hyperglycemia: Secondary | ICD-10-CM

## 2022-08-30 LAB — BASIC METABOLIC PANEL
BUN: 17 mg/dL (ref 6–23)
CO2: 23 mEq/L (ref 19–32)
Calcium: 9.4 mg/dL (ref 8.4–10.5)
Chloride: 102 mEq/L (ref 96–112)
Creatinine, Ser: 1 mg/dL (ref 0.40–1.50)
GFR: 82.82 mL/min (ref >60.00)
Glucose, Bld: 100 mg/dL — ABNORMAL HIGH (ref 70–99)
Potassium: 4.1 mEq/L (ref 3.5–5.1)
Sodium: 135 mEq/L (ref 135–145)

## 2022-08-30 LAB — HEMOGLOBIN A1C: Hgb A1c MFr Bld: 6.8 % — ABNORMAL HIGH (ref 4.6–6.5)

## 2022-09-02 ENCOUNTER — Ambulatory Visit: Payer: BC Managed Care – PPO | Admitting: Endocrinology

## 2022-09-03 ENCOUNTER — Ambulatory Visit: Payer: BC Managed Care – PPO | Admitting: Endocrinology

## 2022-09-21 ENCOUNTER — Other Ambulatory Visit: Payer: Self-pay | Admitting: Endocrinology

## 2022-10-19 ENCOUNTER — Ambulatory Visit: Payer: BC Managed Care – PPO | Admitting: Endocrinology

## 2022-10-19 ENCOUNTER — Encounter: Payer: Self-pay | Admitting: Endocrinology

## 2022-10-19 VITALS — BP 138/76 | HR 102 | Ht 70.5 in | Wt 274.0 lb

## 2022-10-19 DIAGNOSIS — E1169 Type 2 diabetes mellitus with other specified complication: Secondary | ICD-10-CM

## 2022-10-19 DIAGNOSIS — E782 Mixed hyperlipidemia: Secondary | ICD-10-CM | POA: Diagnosis not present

## 2022-10-19 DIAGNOSIS — E669 Obesity, unspecified: Secondary | ICD-10-CM

## 2022-10-19 MED ORDER — FREESTYLE LIBRE 3 SENSOR MISC: 1.0000 | 2 refills | Status: DC

## 2022-10-19 NOTE — Progress Notes (Signed)
Patient ID: Greg Kane, male   DOB: October 29, 1965, 57 y.o.   MRN: [id number]    Reason for Appointment : Endocrinology follow up  History of Present Illness          DIABETES: He has type 2 diabetes mellitus, date of diagnosis 2008  Background information: Initial diagnosis was made incidentally and had only mild hyperglycemia. Initially was treated with metformin alone and then Actos was added He has not had any formal diabetes education and has had difficulty losing weight His blood sugars have been only fairly well controlled with A1c usually in the 7-8 range Because of inadequate control in 2013 he was given Victoza injection in addition. However Actos was stopped  He thinks this helped bring his blood sugars better controlled but helped his portion control only slightly He did not lose much weight with Victoza In 6/14 he was given Invokana in addition and his Victoza was resumed.  RECENT history    Treatment regimen: Insulin:  Tresiba 20 units in a.m.; non-insulin drugs: Ozempic 2 mg weekly, Jardiance 25 mg, metformin 2000 mg  A1c is still improved at 6.8 as of 11/23  Recent blood sugar patterns and problems identified: He has not been able to get his Ozempic consistently and ran out in November Also apparently also last month he was out of his [place] for 3 weeks since it was not available His test trips were not available until he has only 5 readings done in the last month Only doing some fasting readings In December he had an episode of feeling hypoglycemic after lunch but he had a glucose of 80 apparently, treated with hard candy In the last few days he has not been able to refill his Ozempic but he says that he has tried to watch his diet more carefully  His weight has gone up because of inconsistent diet over the last couple of months Also less active now  Glucose monitoring: Verio  Blood Glucose readings from download recently, 1 128-161   Wt Readings  from Last 3 Encounters:  10/19/22 274 lb (124.3 kg)  05/06/22 268 lb 9.6 oz (121.8 kg)  04/29/22 265 lb 9.6 oz (120.5 kg)      Lab Results  Component Value Date   HGBA1C 6.8 (H) 08/30/2022   HGBA1C 6.8 (H) 04/26/2022   HGBA1C 7.4 (H) 01/19/2022   Lab Results  Component Value Date   MICROALBUR <0.7 01/19/2022   [place] 71 04/26/2022   CREATININE 1.00 08/30/2022     Lab Results  Component Value Date   MICROALBUR <0.7 01/19/2022   No visits with results within 1 Week(s) from this visit.  Latest known visit with results is:  Lab on 08/30/2022  Component Date Value Ref Range Status   Sodium 08/30/2022 135  135 - 145 mEq/L Final   Potassium 08/30/2022 4.1  3.5 - 5.1 mEq/L Final   Chloride 08/30/2022 102  96 - 112 mEq/L Final   CO2 08/30/2022 23  19 - 32 mEq/L Final   Glucose, Bld 08/30/2022 100 (H)  70 - 99 mg/dL Final   BUN 08/30/2022 17  6 - 23 mg/dL Final   Creatinine, Ser 08/30/2022 1.00  0.40 - 1.50 mg/dL Final   GFR 08/30/2022 82.82  >60.00 mL/min Final   Calculated using the CKD-EPI Creatinine Equation (2021)   Calcium 08/30/2022 9.4  8.4 - 10.5 mg/dL Final   Hgb A1c MFr Bld 08/30/2022 6.8 (H)  4.6 - 6.5 % Final  Glycemic Control Guidelines for People with Diabetes:Non Diabetic:  <6%Goal of Therapy: <7%Additional Action Suggested:  >8%        Allergies as of 10/19/2022       Reactions   Penicillins Anaphylaxis   Pneumococcal Vaccines Other (See Comments)   Cellulitis, swelling        Medication List        Accurate as of October 19, 2022 12:56 PM. If you have any questions, ask your nurse or doctor.          allopurinol 100 MG tablet Commonly known as: ZYLOPRIM Take 1 tablet (100 mg total) by mouth 2 (two) times daily as needed. For gout   empagliflozin 25 MG Tabs tablet Commonly known as: Jardiance TAKE 1 TABLET(25 MG) BY MOUTH DAILY   FreeStyle Libre 3 Sensor Misc 1 Device by Does not apply route every 14 (fourteen) days. Apply 1 sensor on  upper arm every 14 days for continuous glucose monitoring What changed:  when to take this additional instructions Changed by: Elayne Snare, MD   gabapentin 300 MG capsule Commonly known as: NEURONTIN Take 1 capsule 3 times a day as needed   Insulin Pen Needle 32G X 4 MM Misc Use to inject insulin once daily   metFORMIN 500 MG 24 hr tablet Commonly known as: GLUCOPHAGE-XR TAKE 2 TABLETS TWICE A DAY   OneTouch Delica Lancets 09T Misc Use to check blood sugar 2 times per day dx code E11.65   OneTouch Verio test strip Generic drug: glucose blood Use to check blood sugar twice a day   Ozempic (2 MG/DOSE) 8 MG/3ML Sopn Generic drug: Semaglutide (2 MG/DOSE) DIAL AND INJECT UNDER THE SKIN 2 MG WEEKLY   pantoprazole 40 MG tablet Commonly known as: PROTONIX Take 1 tablet (40 mg total) by mouth daily.   rosuvastatin 10 MG tablet Commonly known as: CRESTOR TAKE 1 TABLET DAILY   Tresiba FlexTouch 100 UNIT/ML FlexTouch Pen Generic drug: insulin degludec INJECT 0.2ML (20 UNITS     TOTAL) SUBCUTANEOUSLY DAILY        Allergies:  Allergies  Allergen Reactions   Penicillins Anaphylaxis   Pneumococcal Vaccines Other (See Comments)    Cellulitis, swelling    Past Medical History:  Diagnosis Date   Allergy    Bowel obstruction ([place]) 10/2011   Diabetes mellitus    Gout    Hx of adenomatous polyp of colon 01/22/2015   Hyperlipidemia    Hypertension    Umbilical hernia     Past Surgical History:  Procedure Laterality Date   APPENDECTOMY     1980s   COLONOSCOPY W/ POLYPECTOMY  01/2015   LUMBAR SPINE SURGERY  2018   dr Ronnald Ramp    Family History  Problem Relation Age of Onset   Diabetes Mother    Hyperlipidemia Father    Hypertension Father    Dementia Father    Colon cancer Neg Hx    Esophageal cancer Neg Hx    Stomach cancer Neg Hx    Rectal cancer Neg Hx     Social History:  reports that he quit smoking about 5 months ago. His smoking use included cigarettes and  cigars. He has a 9.50 pack-year smoking history. He has quit using smokeless tobacco. He reports current alcohol use. He reports that he does not use drugs.    Review of Systems   Hyperlipidemia: He has mixed hyperlipidemia with increased LDL and persistently low HDL Previously on Lipitor which caused muscle aches  He has been able to take 10 mg Crestor every day without muscle aches However not clear why his LDL is still relatively high even though he thinks overall he is watching his diet  Also has had low HDL and relatively higher triglycerides More recently his weight has come down and concomitant glucose was 114  Lab Results  Component Value Date   CHOL 134 04/26/2022   CHOL 197 01/19/2022   CHOL 203 (H) 10/12/2021   Lab Results  Component Value Date   HDL 33.10 (L) 04/26/2022   HDL 33.90 (L) 01/19/2022   HDL 34.30 (L) 10/12/2021   Lab Results  Component Value Date   LDLCALC 71 04/26/2022   LDLCALC 123 (H) 01/19/2022   LDLCALC 133 (H) 10/12/2021   Lab Results  Component Value Date   TRIG 153.0 (H) 04/26/2022   TRIG 200.0 (H) 01/19/2022   TRIG 177.0 (H) 10/12/2021   Lab Results  Component Value Date   CHOLHDL 4 04/26/2022   CHOLHDL 6 01/19/2022   CHOLHDL 6 10/12/2021   Lab Results  Component Value Date   LDLDIRECT 92.0 06/10/2021   LDLDIRECT 160.0 11/04/2020   LDLDIRECT 141.0 09/11/2019     Neuropathy:   He has had painful burning sensation in his feet, mainly at night  Symptoms are minimal, still not needing gabapentin    Foot exam was normal in 10/19/22  Blood pressure: Has consistently normal readings   BP Readings from Last 3 Encounters:  10/19/22 138/76  05/06/22 110/72  04/29/22 112/76     LABS:   No visits with results within 1 Week(s) from this visit.  Latest known visit with results is:  Lab on 08/30/2022  Component Date Value Ref Range Status   Sodium 08/30/2022 135  135 - 145 mEq/L Final   Potassium 08/30/2022 4.1  3.5 - 5.1  mEq/L Final   Chloride 08/30/2022 102  96 - 112 mEq/L Final   CO2 08/30/2022 23  19 - 32 mEq/L Final   Glucose, Bld 08/30/2022 100 (H)  70 - 99 mg/dL Final   BUN 08/30/2022 17  6 - 23 mg/dL Final   Creatinine, Ser 08/30/2022 1.00  0.40 - 1.50 mg/dL Final   GFR 08/30/2022 82.82  >60.00 mL/min Final   Calculated using the CKD-EPI Creatinine Equation (2021)   Calcium 08/30/2022 9.4  8.4 - 10.5 mg/dL Final   Hgb A1c MFr Bld 08/30/2022 6.8 (H)  4.6 - 6.5 % Final   Glycemic Control Guidelines for People with Diabetes:Non Diabetic:  <6%Goal of Therapy: <7%Additional Action Suggested:  >8%     Physical Examination:  BP 138/76 (BP Location: Left Arm, Patient Position: Sitting, Cuff Size: Normal)   Pulse (!) 102   Ht 5' 10.5" (1.791 m)   Wt 274 lb (124.3 kg)   SpO2 94%   BMI 38.76 kg/m   Diabetic Foot Exam - Simple   Simple Foot Form Diabetic Foot exam was performed with the following findings: Yes   Visual Inspection No deformities, no ulcerations, no other skin breakdown bilaterally: Yes Sensation Testing Intact to touch and monofilament testing bilaterally: Yes Pulse Check Posterior Tibialis and Dorsalis pulse intact bilaterally: Yes Comments      ASSESSMENT/PLAN:  Diabetes type 2 with obesity   See history of present illness for detailed discussion of current diabetes management, blood sugar patterns and problems identified  He is on a regimen of basal insulin, Jardiance, Metformin and Ozempic 2 mg weekly  His A1c is improved back to  6.8  His blood sugars are consistently controlled with going up to 2 mg Ozempic However his A1c was done in November and not clear if his sugars are higher now with less monitoring Also not as active  LIPIDS: To be checked on the next visit Microalbumin needs follow-up with the next visit  Plan:  He will try to get regular exercise regimen at least with his stationary bike Check sugars more regularly including after meals He will try  to see if the freestyle Elenor Legato is affordable for use once a month, discount card given.  Discussed that he can use his smart phone to monitor his No change in insulin unless morning sugars are starting to get out of range    There are no Patient Instructions on file for this visit.    Elayne Snare 10/19/2022, 12:56 PM

## 2022-11-05 ENCOUNTER — Other Ambulatory Visit: Payer: Self-pay | Admitting: Endocrinology

## 2022-11-05 DIAGNOSIS — E1165 Type 2 diabetes mellitus with hyperglycemia: Secondary | ICD-10-CM

## 2023-01-24 ENCOUNTER — Other Ambulatory Visit: Payer: Self-pay | Admitting: Endocrinology

## 2023-01-24 DIAGNOSIS — E1165 Type 2 diabetes mellitus with hyperglycemia: Secondary | ICD-10-CM

## 2023-01-27 ENCOUNTER — Other Ambulatory Visit: Payer: Self-pay | Admitting: Endocrinology

## 2023-01-31 ENCOUNTER — Other Ambulatory Visit: Payer: Self-pay | Admitting: Endocrinology

## 2023-02-14 ENCOUNTER — Other Ambulatory Visit: Payer: BC Managed Care – PPO

## 2023-02-15 ENCOUNTER — Other Ambulatory Visit (INDEPENDENT_AMBULATORY_CARE_PROVIDER_SITE_OTHER): Payer: BC Managed Care – PPO

## 2023-02-15 DIAGNOSIS — E782 Mixed hyperlipidemia: Secondary | ICD-10-CM

## 2023-02-15 DIAGNOSIS — E1169 Type 2 diabetes mellitus with other specified complication: Secondary | ICD-10-CM | POA: Diagnosis not present

## 2023-02-15 DIAGNOSIS — E669 Obesity, unspecified: Secondary | ICD-10-CM | POA: Diagnosis not present

## 2023-02-15 LAB — LIPID PANEL
Cholesterol: 231 mg/dL — ABNORMAL HIGH (ref 0–200)
HDL: 33.3 mg/dL — ABNORMAL LOW (ref >39.00)
LDL Cholesterol: 164 mg/dL — ABNORMAL HIGH (ref 0–99)
NonHDL: 197.6
Total CHOL/HDL Ratio: 7
Triglycerides: 166 mg/dL — ABNORMAL HIGH (ref 0.0–149.0)
VLDL: 33.2 mg/dL (ref 0.0–40.0)

## 2023-02-15 LAB — MICROALBUMIN / CREATININE URINE RATIO
Creatinine,U: 73.3 mg/dL
Microalb Creat Ratio: 1 mg/g (ref 0.0–30.0)
Microalb, Ur: <0.7 mg/dL (ref 0.0–1.9)

## 2023-02-15 LAB — COMPREHENSIVE METABOLIC PANEL
ALT: 37 U/L (ref 0–53)
AST: 35 U/L (ref 0–37)
Albumin: 4.5 g/dL (ref 3.5–5.2)
Alkaline Phosphatase: 59 U/L (ref 39–117)
BUN: 13 mg/dL (ref 6–23)
CO2: 26 mEq/L (ref 19–32)
Calcium: 9.7 mg/dL (ref 8.4–10.5)
Chloride: 102 mEq/L (ref 96–112)
Creatinine, Ser: 1.11 mg/dL (ref 0.40–1.50)
GFR: 72.84 mL/min (ref >60.00)
Glucose, Bld: 98 mg/dL (ref 70–99)
Potassium: 4.4 mEq/L (ref 3.5–5.1)
Sodium: 138 mEq/L (ref 135–145)
Total Bilirubin: 0.7 mg/dL (ref 0.2–1.2)
Total Protein: 7.3 g/dL (ref 6.0–8.3)

## 2023-02-15 LAB — HEMOGLOBIN A1C: Hgb A1c MFr Bld: 6.6 % — ABNORMAL HIGH (ref 4.6–6.5)

## 2023-02-17 ENCOUNTER — Ambulatory Visit: Payer: BC Managed Care – PPO | Admitting: Endocrinology

## 2023-02-17 ENCOUNTER — Encounter: Payer: Self-pay | Admitting: Endocrinology

## 2023-02-17 VITALS — BP 112/70 | HR 95 | Resp 16 | Ht 70.5 in | Wt 272.2 lb

## 2023-02-17 DIAGNOSIS — E669 Obesity, unspecified: Secondary | ICD-10-CM

## 2023-02-17 DIAGNOSIS — E782 Mixed hyperlipidemia: Secondary | ICD-10-CM

## 2023-02-17 DIAGNOSIS — Z794 Long term (current) use of insulin: Secondary | ICD-10-CM | POA: Diagnosis not present

## 2023-02-17 DIAGNOSIS — Z6838 Body mass index (BMI) 38.0-38.9, adult: Secondary | ICD-10-CM

## 2023-02-17 DIAGNOSIS — E1165 Type 2 diabetes mellitus with hyperglycemia: Secondary | ICD-10-CM

## 2023-02-17 MED ORDER — PITAVASTATIN CALCIUM 4 MG PO TABS
4.0000 mg | ORAL_TABLET | Freq: Every day | ORAL | 3 refills | Status: DC
Start: 2023-02-17 — End: 2023-08-01

## 2023-02-17 NOTE — Progress Notes (Signed)
Patient ID: Greg Kane, male   DOB: 1966/03/31, 57 y.o.   MRN: [id number]    Reason for Appointment : Endocrinology follow up  History of Present Illness          DIABETES: He has type 2 diabetes mellitus, date of diagnosis 2008  Background information: Initial diagnosis was made incidentally and had only mild hyperglycemia. Initially was treated with metformin alone and then Actos was added He has not had any formal diabetes education and has had difficulty losing weight His blood sugars have been only fairly well controlled with A1c usually in the 7-8 range Because of inadequate control in 2013 he was given Victoza injection in addition. However Actos was stopped  He thinks this helped bring his blood sugars better controlled but helped his portion control only slightly He did not lose much weight with Victoza In 6/14 he was given Invokana in addition and his Victoza was resumed.  RECENT history    Treatment regimen: Insulin:  Tresiba 20 units in a.m.; non-insulin drugs: Ozempic 2 mg weekly, Jardiance 25 mg, metformin 2000 mg  A1c is slightly better at 6.3  Recent blood sugar patterns and problems identified: He has been able to get his Ozempic consistently and has taken it weekly Also he is checking his blood sugars with the freestyle libre even though this is not covered by insurance Most of the time he is benefiting from using the [place] sensor since he can monitor his readings around mealtimes closely He says he is watching his portions and snacks and modifying his diet according to his blood sugars better Has taken Jardiance regularly also He has gone back to the gym the last few weeks and exercise regularly Weight is only down 2 pounds however  Analysis of his Catering manager for the last 2 weeks shows the following data  Overnight blood sugars are currently consistently controlled with readings in the low 100 range but generally decreasing between waking up  time around 4-5 AM through 9-10 AM, no overnight hypoglycemia Premeal readings are in the low 100 range also On an average postprandial readings are not rising significantly and generally under 140 average He has sporadic high readings after meals during the day but somewhat lower in the evenings after dinner Except for low normal readings sporadically he has no hypoglycemia during the day  CGM use % of time 87  2-week average/GV 128/18  Time in range      97%  % Time Above 180 3  % Time above 250   % Time Below 70      PRE-MEAL Fasting Lunch Dinner Bedtime Overall  Glucose range:       Averages: 130       POST-MEAL PC Breakfast PC Lunch PC Dinner  Glucose range:     Averages:  133 139      Wt Readings from Last 3 Encounters:  02/17/23 272 lb 3.2 oz (123.5 kg)  10/19/22 274 lb (124.3 kg)  05/06/22 268 lb 9.6 oz (121.8 kg)      Lab Results  Component Value Date   HGBA1C 6.6 (H) 02/15/2023   HGBA1C 6.8 (H) 08/30/2022   HGBA1C 6.8 (H) 04/26/2022   Lab Results  Component Value Date   MICROALBUR <0.7 02/15/2023   LDLCALC 164 (H) 02/15/2023   CREATININE 1.11 02/15/2023     Lab Results  Component Value Date   MICROALBUR <0.7 02/15/2023   Lab on 02/15/2023  Component Date Value Ref  Range Status   Cholesterol 02/15/2023 231 (H)  0 - 200 mg/dL Final   ATP III Classification       Desirable:  < 200 mg/dL               Borderline High:  200 - 239 mg/dL          High:  > = 161 mg/dL   Triglycerides 09/60/4540 166.0 (H)  0.0 - 149.0 mg/dL Final   Normal:  <981 mg/dLBorderline High:  150 - 199 mg/dL   HDL 19/14/7829 56.21 (L)  >30.86 mg/dL Final   VLDL 57/84/6962 33.2  0.0 - 40.0 mg/dL Final   LDL Cholesterol 02/15/2023 164 (H)  0 - 99 mg/dL Final   Total CHOL/HDL Ratio 02/15/2023 7   Final                  Men          Women1/2 Average Risk     3.4          3.3Average Risk          5.0          4.42X Average Risk          9.6          7.13X Average Risk          15.0           11.0                       NonHDL 02/15/2023 197.60   Final   NOTE:  Non-HDL goal should be 30 mg/dL higher than patient's LDL goal (i.e. LDL goal of < 70 mg/dL, would have non-HDL goal of < 100 mg/dL)   Microalb, Ur 95/28/4132 <0.7  0.0 - 1.9 mg/dL Final   Creatinine,U 44/10/270 73.3  mg/dL Final   Microalb Creat Ratio 02/15/2023 1.0  0.0 - 30.0 mg/g Final   Sodium 02/15/2023 138  135 - 145 mEq/L Final   Potassium 02/15/2023 4.4  3.5 - 5.1 mEq/L Final   Chloride 02/15/2023 102  96 - 112 mEq/L Final   CO2 02/15/2023 26  19 - 32 mEq/L Final   Glucose, Bld 02/15/2023 98  70 - 99 mg/dL Final   BUN 53/66/4403 13  6 - 23 mg/dL Final   Creatinine, Ser 02/15/2023 1.11  0.40 - 1.50 mg/dL Final   Total Bilirubin 02/15/2023 0.7  0.2 - 1.2 mg/dL Final   Alkaline Phosphatase 02/15/2023 59  39 - 117 U/L Final   AST 02/15/2023 35  0 - 37 U/L Final   ALT 02/15/2023 37  0 - 53 U/L Final   Total Protein 02/15/2023 7.3  6.0 - 8.3 g/dL Final   Albumin 47/42/5956 4.5  3.5 - 5.2 g/dL Final   GFR 38/75/6433 72.84  >60.00 mL/min Final   Calculated using the CKD-EPI Creatinine Equation (2021)   Calcium 02/15/2023 9.7  8.4 - 10.5 mg/dL Final   Hgb I9J MFr Bld 02/15/2023 6.6 (H)  4.6 - 6.5 % Final   Glycemic Control Guidelines for People with Diabetes:Non Diabetic:  <6%Goal of Therapy: <7%Additional Action Suggested:  >8%        Allergies as of 02/17/2023       Reactions   Penicillins Anaphylaxis   Pneumococcal Vaccines Other (See Comments)   Cellulitis, swelling        Medication List        Accurate as of Feb 17, 2023  4:03 PM. If you have any questions, ask your nurse or doctor.          STOP taking these medications    rosuvastatin 10 MG tablet Commonly known as: CRESTOR Stopped by: Reather Littler, MD       TAKE these medications    allopurinol 100 MG tablet Commonly known as: ZYLOPRIM Take 1 tablet (100 mg total) by mouth 2 (two) times daily as needed. For gout    empagliflozin 25 MG Tabs tablet Commonly known as: Jardiance TAKE 1 TABLET(25 MG) BY MOUTH DAILY   FreeStyle Libre 3 Sensor Misc USE TO MONITOR BLOOD GLUCOSE LEVELS DAILY, CHANGE EVERY 14 DAYS   gabapentin 300 MG capsule Commonly known as: NEURONTIN Take 1 capsule 3 times a day as needed   Insulin Pen Needle 32G X 4 MM Misc Use to inject insulin once daily   metFORMIN 500 MG 24 hr tablet Commonly known as: GLUCOPHAGE-XR TAKE 2 TABLETS TWICE A DAY   OneTouch Delica Lancets 33G Misc Use to check blood sugar 2 times per day dx code E11.65   OneTouch Verio test strip Generic drug: glucose blood Use to check blood sugar twice a day   Ozempic (2 MG/DOSE) 8 MG/3ML Sopn Generic drug: Semaglutide (2 MG/DOSE) DIAL AND INJECT UNDER THE SKIN 2 MG WEEKLY   pantoprazole 40 MG tablet Commonly known as: PROTONIX Take 1 tablet (40 mg total) by mouth daily.   Pitavastatin Calcium 4 MG Tabs Take 1 tablet (4 mg total) by mouth daily. Started by: Reather Littler, MD   Evaristo Bury FlexTouch 100 UNIT/ML FlexTouch Pen Generic drug: insulin degludec INJECT 0.2ML (20 UNITS     TOTAL) SUBCUTANEOUSLY DAILY        Allergies:  Allergies  Allergen Reactions   Penicillins Anaphylaxis   Pneumococcal Vaccines Other (See Comments)    Cellulitis, swelling    Past Medical History:  Diagnosis Date   Allergy    Bowel obstruction ([facility]) 10/2011   Diabetes mellitus    Gout    Hx of adenomatous polyp of colon 01/22/2015   Hyperlipidemia    Hypertension    Umbilical hernia     Past Surgical History:  Procedure Laterality Date   APPENDECTOMY     1980s   COLONOSCOPY W/ POLYPECTOMY  01/2015   LUMBAR SPINE SURGERY  2018   dr Yetta Barre    Family History  Problem Relation Age of Onset   Diabetes Mother    Hyperlipidemia Father    Hypertension Father    Dementia Father    Colon cancer Neg Hx    Esophageal cancer Neg Hx    Stomach cancer Neg Hx    Rectal cancer Neg Hx     Social History:   reports that he quit smoking about 9 months ago. His smoking use included cigarettes and cigars. He has a 9.50 pack-year smoking history. He has quit using smokeless tobacco. He reports current alcohol use. He reports that he does not use drugs.    Review of Systems   Hyperlipidemia: He has mixed hyperlipidemia with increased LDL and persistently low HDL Previously on Lipitor which caused muscle aches  He recently had been able to take 10 mg Crestor every day without muscle aches but now he says that he was feeling heaviness and swelling in his legs and has not taken this for 2 months with improvement in symptoms  Despite better diet his LDL is much higher at 164  Lab Results  Component  Value Date   CHOL 231 (H) 02/15/2023   CHOL 134 04/26/2022   CHOL 197 01/19/2022   Lab Results  Component Value Date   HDL 33.30 (L) 02/15/2023   HDL 33.10 (L) 04/26/2022   HDL 33.90 (L) 01/19/2022   Lab Results  Component Value Date   LDLCALC 164 (H) 02/15/2023   LDLCALC 71 04/26/2022   LDLCALC 123 (H) 01/19/2022   Lab Results  Component Value Date   TRIG 166.0 (H) 02/15/2023   TRIG 153.0 (H) 04/26/2022   TRIG 200.0 (H) 01/19/2022   Lab Results  Component Value Date   CHOLHDL 7 02/15/2023   CHOLHDL 4 04/26/2022   CHOLHDL 6 01/19/2022   Lab Results  Component Value Date   LDLDIRECT 92.0 06/10/2021   LDLDIRECT 160.0 11/04/2020   LDLDIRECT 141.0 09/11/2019     Neuropathy:   He has had painful burning sensation in his feet, mainly at night  Symptoms are minimal, still not needing gabapentin    Foot exam was normal in 10/19/22  Blood pressure: Has consistently normal readings   BP Readings from Last 3 Encounters:  02/17/23 112/70  10/19/22 138/76  05/06/22 110/72    LABS:   Lab on 02/15/2023  Component Date Value Ref Range Status   Cholesterol 02/15/2023 231 (H)  0 - 200 mg/dL Final   ATP III Classification       Desirable:  < 200 mg/dL               Borderline High:   200 - 239 mg/dL          High:  > = 045 mg/dL   Triglycerides 40/98/1191 166.0 (H)  0.0 - 149.0 mg/dL Final   Normal:  <478 mg/dLBorderline High:  150 - 199 mg/dL   HDL 29/56/2130 86.57 (L)  >84.69 mg/dL Final   VLDL 62/95/2841 33.2  0.0 - 40.0 mg/dL Final   LDL Cholesterol 02/15/2023 164 (H)  0 - 99 mg/dL Final   Total CHOL/HDL Ratio 02/15/2023 7   Final                  Men          Women1/2 Average Risk     3.4          3.3Average Risk          5.0          4.42X Average Risk          9.6          7.13X Average Risk          15.0          11.0                       NonHDL 02/15/2023 197.60   Final   NOTE:  Non-HDL goal should be 30 mg/dL higher than patient's LDL goal (i.e. LDL goal of < 70 mg/dL, would have non-HDL goal of < 100 mg/dL)   Microalb, Ur 32/44/0102 <0.7  0.0 - 1.9 mg/dL Final   Creatinine,U 72/53/6644 73.3  mg/dL Final   Microalb Creat Ratio 02/15/2023 1.0  0.0 - 30.0 mg/g Final   Sodium 02/15/2023 138  135 - 145 mEq/L Final   Potassium 02/15/2023 4.4  3.5 - 5.1 mEq/L Final   Chloride 02/15/2023 102  96 - 112 mEq/L Final   CO2 02/15/2023 26  19 - 32 mEq/L Final   Glucose, Bld 02/15/2023  98  70 - 99 mg/dL Final   BUN 60/45/4098 13  6 - 23 mg/dL Final   Creatinine, Ser 02/15/2023 1.11  0.40 - 1.50 mg/dL Final   Total Bilirubin 02/15/2023 0.7  0.2 - 1.2 mg/dL Final   Alkaline Phosphatase 02/15/2023 59  39 - 117 U/L Final   AST 02/15/2023 35  0 - 37 U/L Final   ALT 02/15/2023 37  0 - 53 U/L Final   Total Protein 02/15/2023 7.3  6.0 - 8.3 g/dL Final   Albumin 11/91/4782 4.5  3.5 - 5.2 g/dL Final   GFR 95/62/1308 72.84  >60.00 mL/min Final   Calculated using the CKD-EPI Creatinine Equation (2021)   Calcium 02/15/2023 9.7  8.4 - 10.5 mg/dL Final   Hgb M5H MFr Bld 02/15/2023 6.6 (H)  4.6 - 6.5 % Final   Glycemic Control Guidelines for People with Diabetes:Non Diabetic:  <6%Goal of Therapy: <7%Additional Action Suggested:  >8%     Physical Examination:  BP 112/70   Pulse  95   Resp 16   Ht 5' 10.5" (1.791 m)   Wt 272 lb 3.2 oz (123.5 kg)   SpO2 99%   BMI 38.50 kg/m     ASSESSMENT/PLAN:  Diabetes type 2 with obesity   See history of present illness for detailed discussion of current diabetes management, blood sugar patterns and problems identified  He is on a regimen of basal insulin, Jardiance, Metformin and Ozempic 2 mg weekly  His A1c is improved to 6.3 compared to 6.6  His blood sugars are consistently controlled with his regimen including 2 mg Ozempic He is also able to improve his diet generally with having the freestyle libre sensor As before his blood sugars are higher when he wakes up in the lowest in the morning hours otherwise Only sporadically has high readings postprandially but overall postprandial readings are controlled with no mealtime insulin With going back to the gym he is also able to exercise  LIPIDS: He thinks he has muscle aches with Crestor His lipids are significantly worse, needs to have better control for cardiovascular risk reduction and explained that even with better diet he does need pharmacological treatment   Microalbumin normal  Plan:  No change in medication regimen including Ozempic Consistent exercise Continue to use the freestyle libre as much as possible No change in insulin dosage  For lipids he is open to trying Livalo 4 mg To be checked on the next visit   There are no Patient Instructions on file for this visit.    Reather Littler 02/17/2023, 4:03 PM

## 2023-02-18 ENCOUNTER — Encounter: Payer: Self-pay | Admitting: Endocrinology

## 2023-03-20 ENCOUNTER — Other Ambulatory Visit: Payer: Self-pay | Admitting: Endocrinology

## 2023-04-16 ENCOUNTER — Other Ambulatory Visit: Payer: Self-pay | Admitting: Endocrinology

## 2023-04-16 DIAGNOSIS — E1165 Type 2 diabetes mellitus with hyperglycemia: Secondary | ICD-10-CM

## 2023-04-25 ENCOUNTER — Other Ambulatory Visit: Payer: Self-pay

## 2023-04-25 DIAGNOSIS — E1165 Type 2 diabetes mellitus with hyperglycemia: Secondary | ICD-10-CM

## 2023-04-25 MED ORDER — EMPAGLIFLOZIN 25 MG PO TABS
ORAL_TABLET | ORAL | 3 refills | Status: DC
Start: 2023-04-25 — End: 2023-09-26

## 2023-05-20 ENCOUNTER — Other Ambulatory Visit (INDEPENDENT_AMBULATORY_CARE_PROVIDER_SITE_OTHER): Payer: BC Managed Care – PPO

## 2023-05-20 DIAGNOSIS — E782 Mixed hyperlipidemia: Secondary | ICD-10-CM

## 2023-05-20 DIAGNOSIS — Z794 Long term (current) use of insulin: Secondary | ICD-10-CM

## 2023-05-20 DIAGNOSIS — E1165 Type 2 diabetes mellitus with hyperglycemia: Secondary | ICD-10-CM | POA: Diagnosis not present

## 2023-05-20 LAB — COMPREHENSIVE METABOLIC PANEL
ALT: 29 U/L (ref 0–53)
AST: 24 U/L (ref 0–37)
Albumin: 4.7 g/dL (ref 3.5–5.2)
Alkaline Phosphatase: 56 U/L (ref 39–117)
BUN: 18 mg/dL (ref 6–23)
CO2: 23 mEq/L (ref 19–32)
Calcium: 9.9 mg/dL (ref 8.4–10.5)
Chloride: 100 mEq/L (ref 96–112)
Creatinine, Ser: 1 mg/dL (ref 0.40–1.50)
GFR: 82.41 mL/min (ref >60.00)
Glucose, Bld: 102 mg/dL — ABNORMAL HIGH (ref 70–99)
Potassium: 4.1 mEq/L (ref 3.5–5.1)
Sodium: 133 mEq/L — ABNORMAL LOW (ref 135–145)
Total Bilirubin: 0.7 mg/dL (ref 0.2–1.2)
Total Protein: 7.4 g/dL (ref 6.0–8.3)

## 2023-05-20 LAB — LIPID PANEL
Cholesterol: 174 mg/dL (ref 0–200)
HDL: 33.4 mg/dL — ABNORMAL LOW (ref >39.00)
LDL Cholesterol: 112 mg/dL — ABNORMAL HIGH (ref 0–99)
NonHDL: 140.36
Total CHOL/HDL Ratio: 5
Triglycerides: 141 mg/dL (ref 0.0–149.0)
VLDL: 28.2 mg/dL (ref 0.0–40.0)

## 2023-05-20 LAB — HEMOGLOBIN A1C: Hgb A1c MFr Bld: 6.6 % — ABNORMAL HIGH (ref 4.6–6.5)

## 2023-05-24 ENCOUNTER — Encounter: Payer: Self-pay | Admitting: Endocrinology

## 2023-05-24 ENCOUNTER — Ambulatory Visit: Payer: BC Managed Care – PPO | Admitting: Endocrinology

## 2023-05-24 VITALS — BP 122/80 | HR 92 | Ht 70.5 in | Wt 272.0 lb

## 2023-05-24 DIAGNOSIS — E782 Mixed hyperlipidemia: Secondary | ICD-10-CM

## 2023-05-24 DIAGNOSIS — Z794 Long term (current) use of insulin: Secondary | ICD-10-CM

## 2023-05-24 DIAGNOSIS — E1169 Type 2 diabetes mellitus with other specified complication: Secondary | ICD-10-CM | POA: Diagnosis not present

## 2023-05-24 DIAGNOSIS — Z7984 Long term (current) use of oral hypoglycemic drugs: Secondary | ICD-10-CM

## 2023-05-24 DIAGNOSIS — Z7985 Long-term (current) use of injectable non-insulin antidiabetic drugs: Secondary | ICD-10-CM

## 2023-05-24 NOTE — Progress Notes (Signed)
Patient ID: Greg Kane, male   DOB: January 11, 1966, 57 y.o.   MRN: [id number]    Reason for Appointment : Endocrinology follow up  History of Present Illness          DIABETES: He has type 2 diabetes mellitus, date of diagnosis 2008  Background information: Initial diagnosis was made incidentally and had only mild hyperglycemia. Initially was treated with metformin alone and then Actos was added He has not had any formal diabetes education and has had difficulty losing weight His blood sugars have been only fairly well controlled with A1c usually in the 7-8 range Because of inadequate control in 2013 he was given Victoza injection in addition. However Actos was stopped  He thinks this helped bring his blood sugars better controlled but helped his portion control only slightly He did not lose much weight with Victoza In 6/14 he was given Invokana in addition and his Victoza was resumed.  RECENT history    Treatment regimen: Insulin:  Tresiba 20 units in a.m.; non-insulin drugs: Ozempic 2 mg weekly, Jardiance 25 mg, metformin 2000 mg  A1c is 6.6 and stable  Recent blood sugar patterns and problems identified: As before he is checking his blood sugars with the freestyle libre even though this is not covered by insurance, generally applying this intermittently to save on cost His blood sugars are excellent with in target range of 97% and only sporadically high readings after breakfast or dinner As before his watching his portions and snacks and keeping up with his diet according to his blood sugars better; he knows what foods make his blood sugars go up Has taken Jardiance regularly also He is going to the gym as before to exercise regularly Fasting readings are excellent and he is continuing the same dose of insulin   Analysis of his Josephine Igo download for the last 2 weeks shows the following data  Overnight blood sugars are very well-controlled without much fluctuation or  hypoglycemia On average blood sugars are slightly higher midday and dinnertime but still averaging below 130 Has only rare high blood sugars over 180 late morning or after dinner Otherwise postprandial readings are very consistently controlled with highest average after dinner No hypoglycemia during the day   CGM use % of time 92  2-week average/GV 129/18  Time in range      97%  % Time Above 180 3  % Time above 250   % Time Below 70      PRE-MEAL Fasting Lunch Dinner Bedtime Overall  Glucose range:       Mean/median: 125       POST-MEAL PC Breakfast PC Lunch PC Dinner  Glucose range:     Mean/median: 126 131  149   Previous data:   PRE-MEAL Fasting Lunch Dinner Bedtime Overall  Glucose range:       Averages: 130       POST-MEAL PC Breakfast PC Lunch PC Dinner  Glucose range:     Averages:  133 139    Wt Readings from Last 3 Encounters:  05/24/23 272 lb (123.4 kg)  02/17/23 272 lb 3.2 oz (123.5 kg)  10/19/22 274 lb (124.3 kg)      Lab Results  Component Value Date   HGBA1C 6.6 (H) 05/20/2023   HGBA1C 6.6 (H) 02/15/2023   HGBA1C 6.8 (H) 08/30/2022   Lab Results  Component Value Date   MICROALBUR <0.7 02/15/2023   LDLCALC 112 (H) 05/20/2023   CREATININE 1.00 05/20/2023  Lab Results  Component Value Date   MICROALBUR <0.7 02/15/2023   Lab on 05/20/2023  Component Date Value Ref Range Status   Cholesterol 05/20/2023 174  0 - 200 mg/dL Final   ATP III Classification       Desirable:  < 200 mg/dL               Borderline High:  200 - 239 mg/dL          High:  > = 409 mg/dL   Triglycerides 81/19/1478 141.0  0.0 - 149.0 mg/dL Final   Normal:  <295 mg/dLBorderline High:  150 - 199 mg/dL   HDL 62/13/0865 78.46 (L)  >96.29 mg/dL Final   VLDL 52/84/1324 28.2  0.0 - 40.0 mg/dL Final   LDL Cholesterol 05/20/2023 112 (H)  0 - 99 mg/dL Final   Total CHOL/HDL Ratio 05/20/2023 5   Final                  Men          Women1/2 Average Risk     3.4           3.3Average Risk          5.0          4.42X Average Risk          9.6          7.13X Average Risk          15.0          11.0                       NonHDL 05/20/2023 140.36   Final   NOTE:  Non-HDL goal should be 30 mg/dL higher than patient's LDL goal (i.e. LDL goal of < 70 mg/dL, would have non-HDL goal of < 100 mg/dL)   Sodium 40/07/2724 366 (L)  135 - 145 mEq/L Final   Potassium 05/20/2023 4.1  3.5 - 5.1 mEq/L Final   Chloride 05/20/2023 100  96 - 112 mEq/L Final   CO2 05/20/2023 23  19 - 32 mEq/L Final   Glucose, Bld 05/20/2023 102 (H)  70 - 99 mg/dL Final   BUN 44/12/4740 18  6 - 23 mg/dL Final   Creatinine, Ser 05/20/2023 1.00  0.40 - 1.50 mg/dL Final   Total Bilirubin 05/20/2023 0.7  0.2 - 1.2 mg/dL Final   Alkaline Phosphatase 05/20/2023 56  39 - 117 U/L Final   AST 05/20/2023 24  0 - 37 U/L Final   ALT 05/20/2023 29  0 - 53 U/L Final   Total Protein 05/20/2023 7.4  6.0 - 8.3 g/dL Final   Albumin 59/56/3875 4.7  3.5 - 5.2 g/dL Final   GFR 64/33/2951 82.41  >60.00 mL/min Final   Calculated using the CKD-EPI Creatinine Equation (2021)   Calcium 05/20/2023 9.9  8.4 - 10.5 mg/dL Final   Hgb O8C MFr Bld 05/20/2023 6.6 (H)  4.6 - 6.5 % Final   Glycemic Control Guidelines for People with Diabetes:Non Diabetic:  <6%Goal of Therapy: <7%Additional Action Suggested:  >8%        Allergies as of 05/24/2023       Reactions   Penicillins Anaphylaxis   Pneumococcal Vaccines Other (See Comments)   Cellulitis, swelling        Medication List        Accurate as of May 24, 2023 10:36 AM. If you have any questions, ask your nurse  or doctor.          allopurinol 100 MG tablet Commonly known as: ZYLOPRIM Take 1 tablet (100 mg total) by mouth 2 (two) times daily as needed. For gout   empagliflozin 25 MG Tabs tablet Commonly known as: Jardiance TAKE 1 TABLET(25 MG) BY MOUTH DAILY   FreeStyle Libre 3 Sensor Misc USE TO MONITOR BLOOD GLUCOSE LEVELS DAILY, CHANGE EVERY 14 DAYS    gabapentin 300 MG capsule Commonly known as: NEURONTIN Take 1 capsule 3 times a day as needed   Insulin Pen Needle 32G X 4 MM Misc Use to inject insulin once daily   metFORMIN 500 MG 24 hr tablet Commonly known as: GLUCOPHAGE-XR TAKE 2 TABLETS TWICE A DAY   OneTouch Delica Lancets 33G Misc Use to check blood sugar 2 times per day dx code E11.65   OneTouch Verio test strip Generic drug: glucose blood Use to check blood sugar twice a day   Ozempic (2 MG/DOSE) 8 MG/3ML Sopn Generic drug: Semaglutide (2 MG/DOSE) DIAL AND INJECT UNDER THE SKIN 2 MG WEEKLY   pantoprazole 40 MG tablet Commonly known as: PROTONIX Take 1 tablet (40 mg total) by mouth daily.   Pitavastatin Calcium 4 MG Tabs Take 1 tablet (4 mg total) by mouth daily.   Evaristo Bury FlexTouch 100 UNIT/ML FlexTouch Pen Generic drug: insulin degludec INJECT 0.2ML (20 UNITS     TOTAL) SUBCUTANEOUSLY DAILY        Allergies:  Allergies  Allergen Reactions   Penicillins Anaphylaxis   Pneumococcal Vaccines Other (See Comments)    Cellulitis, swelling    Past Medical History:  Diagnosis Date   Allergy    Bowel obstruction (HCC) 10/2011   Diabetes mellitus    Gout    Hx of adenomatous polyp of colon 01/22/2015   Hyperlipidemia    Hypertension    Umbilical hernia     Past Surgical History:  Procedure Laterality Date   APPENDECTOMY     1980s   COLONOSCOPY W/ POLYPECTOMY  01/2015   LUMBAR SPINE SURGERY  2018   dr Yetta Barre    Family History  Problem Relation Age of Onset   Diabetes Mother    Hyperlipidemia Father    Hypertension Father    Dementia Father    Colon cancer Neg Hx    Esophageal cancer Neg Hx    Stomach cancer Neg Hx    Rectal cancer Neg Hx     Social History:  reports that he quit smoking about 12 months ago. His smoking use included cigarettes and cigars. He started smoking about 39 years ago. He has a 9.5 pack-year smoking history. He has quit using smokeless tobacco. He reports current  alcohol use. He reports that he does not use drugs.    Review of Systems   Hyperlipidemia: He has mixed hyperlipidemia with increased LDL and persistently low HDL Previously on Lipitor which caused muscle aches On his last he had complained that 10 mg Crestor every day was causing heaviness and swelling in his legs which improved with stopping the medication  However he is tolerating Livalo 4 mg very well and LDL is improved although still over 100 Triglycerides also back to normal  Lab Results  Component Value Date   CHOL 174 05/20/2023   CHOL 231 (H) 02/15/2023   CHOL 134 04/26/2022   Lab Results  Component Value Date   HDL 33.40 (L) 05/20/2023   HDL 33.30 (L) 02/15/2023   HDL 33.10 (L) 04/26/2022   Lab  Results  Component Value Date   LDLCALC 112 (H) 05/20/2023   LDLCALC 164 (H) 02/15/2023   LDLCALC 71 04/26/2022   Lab Results  Component Value Date   TRIG 141.0 05/20/2023   TRIG 166.0 (H) 02/15/2023   TRIG 153.0 (H) 04/26/2022   Lab Results  Component Value Date   CHOLHDL 5 05/20/2023   CHOLHDL 7 02/15/2023   CHOLHDL 4 04/26/2022   Lab Results  Component Value Date   LDLDIRECT 92.0 06/10/2021   LDLDIRECT 160.0 11/04/2020   LDLDIRECT 141.0 09/11/2019     Neuropathy:   He has a history of painful burning sensation in his feet, mainly at night  Symptoms are nearly resolved, still not needing gabapentin    Foot exam was normal in 10/19/22  Blood pressure: Has consistently normal readings without medications   BP Readings from Last 3 Encounters:  05/24/23 122/80  02/17/23 112/70  10/19/22 138/76   Hyponatremia: Not related to medications and this is new He says he drinks 32 ounces of water in the morning with his medications  LABS:   Lab on 05/20/2023  Component Date Value Ref Range Status   Cholesterol 05/20/2023 174  0 - 200 mg/dL Final   ATP III Classification       Desirable:  < 200 mg/dL               Borderline High:  200 - 239 mg/dL           High:  > = 401 mg/dL   Triglycerides 02/72/5366 141.0  0.0 - 149.0 mg/dL Final   Normal:  <440 mg/dLBorderline High:  150 - 199 mg/dL   HDL 34/74/2595 63.87 (L)  >56.43 mg/dL Final   VLDL 32/95/1884 28.2  0.0 - 40.0 mg/dL Final   LDL Cholesterol 05/20/2023 112 (H)  0 - 99 mg/dL Final   Total CHOL/HDL Ratio 05/20/2023 5   Final                  Men          Women1/2 Average Risk     3.4          3.3Average Risk          5.0          4.42X Average Risk          9.6          7.13X Average Risk          15.0          11.0                       NonHDL 05/20/2023 140.36   Final   NOTE:  Non-HDL goal should be 30 mg/dL higher than patient's LDL goal (i.e. LDL goal of < 70 mg/dL, would have non-HDL goal of < 100 mg/dL)   Sodium 16/60/6301 601 (L)  135 - 145 mEq/L Final   Potassium 05/20/2023 4.1  3.5 - 5.1 mEq/L Final   Chloride 05/20/2023 100  96 - 112 mEq/L Final   CO2 05/20/2023 23  19 - 32 mEq/L Final   Glucose, Bld 05/20/2023 102 (H)  70 - 99 mg/dL Final   BUN 09/32/3557 18  6 - 23 mg/dL Final   Creatinine, Ser 05/20/2023 1.00  0.40 - 1.50 mg/dL Final   Total Bilirubin 05/20/2023 0.7  0.2 - 1.2 mg/dL Final   Alkaline Phosphatase 05/20/2023 56  39 - 117 U/L  Final   AST 05/20/2023 24  0 - 37 U/L Final   ALT 05/20/2023 29  0 - 53 U/L Final   Total Protein 05/20/2023 7.4  6.0 - 8.3 g/dL Final   Albumin 16/07/9603 4.7  3.5 - 5.2 g/dL Final   GFR 54/06/8118 82.41  >60.00 mL/min Final   Calculated using the CKD-EPI Creatinine Equation (2021)   Calcium 05/20/2023 9.9  8.4 - 10.5 mg/dL Final   Hgb J4N MFr Bld 05/20/2023 6.6 (H)  4.6 - 6.5 % Final   Glycemic Control Guidelines for People with Diabetes:Non Diabetic:  <6%Goal of Therapy: <7%Additional Action Suggested:  >8%     Physical Examination:  BP 122/80 (BP Location: Left Arm, Patient Position: Sitting, Cuff Size: Large)   Pulse 92   Ht 5' 10.5" (1.791 m)   Wt 272 lb (123.4 kg)   SpO2 97%   BMI 38.48 kg/m      ASSESSMENT/PLAN:  Diabetes type 2 with obesity   See history of present illness for detailed discussion of current diabetes management, blood sugar patterns and problems identified  He is on a regimen of basal insulin, Jardiance, Metformin and Ozempic 2 mg weekly  His A1c is still excellent at 6.6  He is doing very well on his diet and also exercise regimen His concern only is lack of weight loss despite his efforts to improve his lifestyle and continuing Ozempic Recent sensor GMI is 6.4 with 97% readings within target  LIPIDS: Improving with Livalo maximum dose 4 mg but still over 100 Currently is refusing to consider additional medication and wants to wait  Blood pressure normal  Unclear whether his slightly low sodium is related to SIADH  Plan:  Continue same doses of insulin and Ozempic He can continue to use the freestyle libre sensor at least once a month to help monitor his blood sugars and assist with his meal planning Consider adding Zetia on the next visit if LDL still high Likely needs to have significant amount of weight training with his exercise to help lose weight May also consider switching to [facility] if weight starts going up Also discussed limiting amount of water he takes in the morning   There are no Patient Instructions on file for this visit.    Reather Littler 05/24/2023, 10:36 AM

## 2023-05-27 ENCOUNTER — Encounter: Payer: Self-pay | Admitting: Endocrinology

## 2023-07-01 ENCOUNTER — Other Ambulatory Visit: Payer: Self-pay

## 2023-07-01 DIAGNOSIS — Z794 Long term (current) use of insulin: Secondary | ICD-10-CM

## 2023-07-01 MED ORDER — FREESTYLE LIBRE 3 SENSOR MISC
1.0000 | 3 refills | Status: DC
Start: 2023-07-01 — End: 2023-10-24

## 2023-07-25 ENCOUNTER — Telehealth: Payer: Self-pay

## 2023-07-25 ENCOUNTER — Other Ambulatory Visit (HOSPITAL_COMMUNITY): Payer: Self-pay

## 2023-07-25 NOTE — Telephone Encounter (Signed)
Pharmacy Patient Advocate Encounter  Received notification from [facility] that Prior Authorization for Ozempic (2 MG/DOSE) 8MG /3ML pen-injectors has been APPROVED from 07-06-2023 to xxx   PA #/Case ID/Reference #: [id number]

## 2023-08-01 ENCOUNTER — Encounter: Payer: Self-pay | Admitting: Endocrinology

## 2023-08-01 ENCOUNTER — Other Ambulatory Visit: Payer: Self-pay

## 2023-08-01 DIAGNOSIS — E1165 Type 2 diabetes mellitus with hyperglycemia: Secondary | ICD-10-CM

## 2023-08-01 MED ORDER — OZEMPIC (2 MG/DOSE) 8 MG/3ML ~~LOC~~ SOPN
PEN_INJECTOR | SUBCUTANEOUS | 2 refills | Status: DC
Start: 2023-08-01 — End: 2023-08-05

## 2023-08-01 MED ORDER — PITAVASTATIN CALCIUM 4 MG PO TABS
4.0000 mg | ORAL_TABLET | Freq: Every day | ORAL | 3 refills | Status: DC
Start: 2023-08-01 — End: 2024-01-12

## 2023-08-01 NOTE — Telephone Encounter (Signed)
 Medication refill request complete

## 2023-08-05 ENCOUNTER — Other Ambulatory Visit: Payer: Self-pay

## 2023-08-05 DIAGNOSIS — E1165 Type 2 diabetes mellitus with hyperglycemia: Secondary | ICD-10-CM

## 2023-08-05 MED ORDER — OZEMPIC (2 MG/DOSE) 8 MG/3ML ~~LOC~~ SOPN
PEN_INJECTOR | SUBCUTANEOUS | 2 refills | Status: DC
Start: 2023-08-05 — End: 2024-01-12

## 2023-08-08 ENCOUNTER — Encounter: Payer: Self-pay | Admitting: Endocrinology

## 2023-08-08 ENCOUNTER — Other Ambulatory Visit: Payer: Self-pay

## 2023-08-08 DIAGNOSIS — E1169 Type 2 diabetes mellitus with other specified complication: Secondary | ICD-10-CM

## 2023-08-08 MED ORDER — METFORMIN HCL ER 500 MG PO TB24
ORAL_TABLET | ORAL | 1 refills | Status: DC
Start: 2023-08-08 — End: 2024-02-13

## 2023-08-08 MED ORDER — TRESIBA FLEXTOUCH 100 UNIT/ML ~~LOC~~ SOPN
PEN_INJECTOR | SUBCUTANEOUS | 0 refills | Status: DC
Start: 2023-08-08 — End: 2024-05-29

## 2023-08-23 ENCOUNTER — Other Ambulatory Visit (HOSPITAL_COMMUNITY): Payer: Self-pay

## 2023-09-12 ENCOUNTER — Encounter: Payer: Self-pay | Admitting: Family Medicine

## 2023-09-12 ENCOUNTER — Ambulatory Visit (INDEPENDENT_AMBULATORY_CARE_PROVIDER_SITE_OTHER): Payer: BC Managed Care – PPO | Admitting: Family Medicine

## 2023-09-12 VITALS — BP 120/78 | HR 104 | Temp 97.9°F | Resp 18 | Ht 70.5 in | Wt 279.0 lb

## 2023-09-12 DIAGNOSIS — I10 Essential (primary) hypertension: Secondary | ICD-10-CM | POA: Diagnosis not present

## 2023-09-12 DIAGNOSIS — Z23 Encounter for immunization: Secondary | ICD-10-CM

## 2023-09-12 DIAGNOSIS — Z0000 Encounter for general adult medical examination without abnormal findings: Secondary | ICD-10-CM

## 2023-09-12 DIAGNOSIS — E119 Type 2 diabetes mellitus without complications: Secondary | ICD-10-CM

## 2023-09-12 DIAGNOSIS — Z7984 Long term (current) use of oral hypoglycemic drugs: Secondary | ICD-10-CM

## 2023-09-12 DIAGNOSIS — Z7985 Long-term (current) use of injectable non-insulin antidiabetic drugs: Secondary | ICD-10-CM

## 2023-09-12 DIAGNOSIS — E782 Mixed hyperlipidemia: Secondary | ICD-10-CM

## 2023-09-12 NOTE — Progress Notes (Signed)
Established Patient Office Visit  Subjective   Patient ID: Greg Kane, male    DOB: Sep 20, 1966  Age: 57 y.o. MRN: [id number]  Chief Complaint  Patient presents with   Annual Exam    Pt states not fasting     HPI Discussed the use of AI scribe software for clinical note transcription with the patient, who gave verbal consent to proceed.  History of Present Illness   The patient, with a history of diabetes, arthritis, and a recent dental implant procedure, presents for a routine physical. He reports a consistently elevated heart rate, which has been evaluated with an ultrasound in the past. The patient's diabetes is managed with Ozempic, and he reports good control with a recent HbA1c of 6.4. However, he expresses concern about recent weight gain, particularly after the holiday season.  The patient also reports arthritis in the knees, hands, and back, which has been managed with Aleve. He mentions a recent decrease in exercise due to arthritis pain and plans to resume regular gym visits after the holidays. He also expresses interest in swimming as a low-impact exercise option.  The patient has a history of a successful back surgery performed by Dr. Yetta Barre at [facility] about six to seven years ago. He still experiences some numbness when overexerting himself. He also reports a regular sleep pattern, waking up once at night with a full bladder, which he considers normal.  The patient is due for a colonoscopy this year, following a previous procedure two years ago that revealed an internal hemorrhoid. 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 The patient attributes the slow healing process to his diabetes.  The patient is due for a PSA test, although he reports no urinary symptoms. He has not received the shingles vaccine but expresses interest in getting it. 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 He is currently using a Libre device for diabetes management, which he finds extremely helpful.      Patient Active Problem List   Diagnosis Date Noted   Pre-op evaluation 05/06/2022   Rectal bleeding 12/11/2020   Dyspepsia 12/11/2020   Abdominal bloating 12/11/2020   Right lower quadrant abdominal pain 12/11/2020   Umbilical hernia without obstruction and without gangrene 06/25/2020   Hx of adenomatous polyp of colon 01/22/2015   Preventative health care 11/01/2014   Obesity (BMI 30-39.9) 06/11/2013   Type II or unspecified type diabetes mellitus without mention of complication, uncontrolled 05/10/2013   Conjunctivitis 05/03/2013   Stye 05/03/2013   Lower urinary tract infectious disease 10/15/2011   Urinary retention 10/13/2011   Constipation 10/13/2011   Proteinuria 06/14/2011   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   MYALGIA 06/20/2008   Essential hypertension 03/25/2008   Backache 10/24/2007   PARESTHESIA 10/24/2007   Diabetes mellitus without complication (HCC) 05/03/2007   Mixed hyperlipidemia 05/03/2007   DERMATITIS NOS 05/03/2007   Allergic rhinitis 02/21/2007   Past Medical History:  Diagnosis Date   Allergy    Bowel obstruction (HCC) 10/2011   Diabetes mellitus    Gout    Hx of adenomatous polyp of colon 01/22/2015   Hyperlipidemia    Hypertension    Umbilical hernia    Past Surgical History:  Procedure Laterality Date   APPENDECTOMY     1980s   COLONOSCOPY W/ POLYPECTOMY  01/2015   LUMBAR SPINE SURGERY  2018   dr Yetta Barre  Social History   Tobacco Use   Smoking status: Former    Current packs/day: 0.00    Average packs/day: 0.3 packs/day for 38.0 years (9.5 ttl pk-yrs)    Types: Cigarettes, Cigars    Start date: 04/29/1984    Quit date: 04/29/2022    Years since quitting: 1.3   Smokeless tobacco: Former   Tobacco comments:    pt doesn't buy cig-- borrows from others no more than 4 cig a week---quit 1 week ago (July 2023)   Substance Use Topics   Alcohol use: Yes    Alcohol/week: 0.0 standard drinks of alcohol    Comment: occas   Drug use: No   Social History   Socioeconomic History   Marital status: Married    Spouse name: Not on file   Number of children: Not on file   Years of education: Not on file   Highest education level: Not on file  Occupational History   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  Tobacco Use   Smoking status: Former    Current packs/day: 0.00    Average packs/day: 0.3 packs/day for 38.0 years (9.5 ttl pk-yrs)    Types: Cigarettes, Cigars    Start date: 04/29/1984    Quit date: 04/29/2022    Years since quitting: 1.3   Smokeless tobacco: Former   Tobacco comments:    pt doesn't buy cig-- borrows from others no more than 4 cig a week---quit 1 week ago (July 2023)  Substance and Sexual Activity   Alcohol use: Yes    Alcohol/week: 0.0 standard drinks of alcohol    Comment: occas   Drug use: No   Sexual activity: Yes  Other Topics Concern   Not on file  Social History Narrative   Married   Children 3   Manufacturing engineer   Exercise -a little      Social Determinants of Health   Financial Resource Strain: Not on file  Food Insecurity: Not on file  Transportation Needs: Not on file  Physical Activity: Not on file  Stress: Not on file  Social Connections: Not on file  Intimate Partner Violence: Not on file   Family Status  Relation Name Status   Mother  Deceased   Father  Deceased   Neg Hx  (Not Specified)  No partnership data on file   Family History  Problem Relation Age of Onset   Diabetes Mother    Hyperlipidemia Father    Hypertension Father    Dementia Father    Colon cancer Neg Hx    Esophageal cancer Neg Hx    Stomach cancer Neg Hx    Rectal cancer Neg Hx    Allergies  Allergen Reactions   Penicillins Anaphylaxis   Pneumococcal Vaccines Other (See Comments)    Cellulitis, swelling      Review of Systems  Constitutional:  Negative for fever  and malaise/fatigue.  HENT:  Negative for congestion.   Eyes:  Negative for blurred vision.  Respiratory:  Negative for cough and shortness of breath.   Cardiovascular:  Negative for chest pain, palpitations and leg swelling.  Gastrointestinal:  Negative for vomiting.  Musculoskeletal:  Negative for back pain.  Skin:  Negative for rash.  Neurological:  Negative for loss of consciousness and headaches.      Objective:     BP 120/78 (BP Location: Left Arm, Patient Position: Sitting, Cuff Size: Large)   Pulse (!) 104   Temp 97.9 F (36.6 C) (Oral)   Resp 18  Ht 5' 10.5" (1.791 m)   Wt 279 lb (126.6 kg)   SpO2 95%   BMI 39.47 kg/m  BP Readings from Last 3 Encounters:  09/12/23 120/78  05/24/23 122/80  02/17/23 112/70   Wt Readings from Last 3 Encounters:  09/12/23 279 lb (126.6 kg)  05/24/23 272 lb (123.4 kg)  02/17/23 272 lb 3.2 oz (123.5 kg)   SpO2 Readings from Last 3 Encounters:  09/12/23 95%  05/24/23 97%  02/17/23 99%      Physical Exam   No results found for any visits on 09/12/23.  Last CBC Lab Results  Component Value Date   WBC 11.6 (H) 12/11/2020   HGB 17.8 Repeated and verified X2. (H) 12/11/2020   HCT 52.5 (H) 12/11/2020   MCV 88.7 12/11/2020   MCH 30.8 06/24/2020   RDW 13.7 12/11/2020   PLT 226.0 12/11/2020   Last metabolic panel Lab Results  Component Value Date   GLUCOSE 102 (H) 05/20/2023   NA 133 (L) 05/20/2023   K 4.1 05/20/2023   CL 100 05/20/2023   CO2 23 05/20/2023   BUN 18 05/20/2023   CREATININE 1.00 05/20/2023   GFR 82.41 05/20/2023   CALCIUM 9.9 05/20/2023   PROT 7.4 05/20/2023   ALBUMIN 4.7 05/20/2023   BILITOT 0.7 05/20/2023   ALKPHOS 56 05/20/2023   AST 24 05/20/2023   ALT 29 05/20/2023   Last lipids Lab Results  Component Value Date   CHOL 174 05/20/2023   HDL 33.40 (L) 05/20/2023   LDLCALC 112 (H) 05/20/2023   LDLDIRECT 92.0 06/10/2021   TRIG 141.0 05/20/2023   CHOLHDL 5 05/20/2023   Last hemoglobin  A1c Lab Results  Component Value Date   HGBA1C 6.6 (H) 05/20/2023   Last thyroid functions Lab Results  Component Value Date   TSH 1.50 12/11/2020   Last vitamin D No results found for: "25OHVITD2", "25OHVITD3", "VD25OH" Last vitamin B12 and Folate No results found for: "VITAMINB12", "FOLATE"    The 10-year ASCVD risk score (Arnett DK, et al., 2019) is: 15.6%    Assessment & Plan:   Problem List Items Addressed This Visit       Unprioritized   Mixed hyperlipidemia   Relevant Orders   Lipid panel   TSH   Comprehensive metabolic panel   CBC with Differential/Platelet   Essential hypertension   Relevant Orders   Lipid panel   TSH   Comprehensive metabolic panel   CBC with Differential/Platelet   Diabetes mellitus without complication (HCC)   Preventative health care - Primary    Ghm utd Check labs  See AVS  Health Maintenance  Topic Date Due   HIV Screening  Never done   OPHTHALMOLOGY EXAM  11/06/2021   COVID-19 Vaccine (5 - 2023-24 season) 06/05/2023   INFLUENZA VACCINE  01/02/2024 (Originally 05/05/2023)   FOOT EXAM  10/20/2023   Zoster Vaccines- Shingrix (2 of 2) 11/07/2023   HEMOGLOBIN A1C  11/20/2023   Colonoscopy  02/13/2024   Diabetic kidney evaluation - Urine ACR  02/15/2024   Diabetic kidney evaluation - eGFR measurement  05/19/2024   DTaP/Tdap/Td (3 - Td or Tdap) 09/11/2033   Hepatitis C Screening  Completed   HPV VACCINES  Aged Out         Relevant Orders   Lipid panel   PSA   TSH   Comprehensive metabolic panel   CBC with Differential/Platelet   Other Visit Diagnoses     Need for shingles vaccine  Relevant Orders   Zoster Recombinant (Shingrix ) (Completed)   Need for Tdap vaccination       Relevant Orders   Tdap vaccine greater than or equal to 7yo IM (Completed)     Assessment and Plan    Type 2 Diabetes Mellitus   His diabetes is well-controlled with Ozempic, evidenced by a recent A1c of 6.4, though he has gained 5  pounds. We discussed the potential benefits of switching to [facility] for better weight management, given its superior weight loss outcomes compared to Ozempic. We will continue Ozempic and discuss the potential switch to [facility] with his endocrinologist.  Arthritis   He has arthritis in his knees, hands, and back, which has prevented him from exercising for 1.5 months due to pain. While Aleve provides relief, he has not tried dual-action pain relief. We discussed the benefits of a Tylenol and ibuprofen combination and recommended low-impact exercises like swimming or walking in the pool for pain management.  General Health Maintenance   He is due for a shingles vaccine, tetanus shot, and a colonoscopy in 2025. We informed him about the two-dose shingles vaccine schedule and potential side effects. He will receive the first dose of the shingles vaccine today and the second dose in 2-6 months, along with the tetanus shot. We will schedule the colonoscopy for 2025. He should continue annual eye exams and follow-ups for ongoing dental implants.  Follow-up   He will follow up with his endocrinologist next week.        No follow-ups on file.    Donato Schultz, DO

## 2023-09-12 NOTE — Assessment & Plan Note (Signed)
Ghm utd Check labs  See AVS  Health Maintenance  Topic Date Due   HIV Screening  Never done   OPHTHALMOLOGY EXAM  11/06/2021   COVID-19 Vaccine (5 - 2023-24 season) 06/05/2023   INFLUENZA VACCINE  01/02/2024 (Originally 05/05/2023)   FOOT EXAM  10/20/2023   Zoster Vaccines- Shingrix (2 of 2) 11/07/2023   HEMOGLOBIN A1C  11/20/2023   Colonoscopy  02/13/2024   Diabetic kidney evaluation - Urine ACR  02/15/2024   Diabetic kidney evaluation - eGFR measurement  05/19/2024   DTaP/Tdap/Td (3 - Td or Tdap) 09/11/2033   Hepatitis C Screening  Completed   HPV VACCINES  Aged Out

## 2023-09-12 NOTE — Patient Instructions (Signed)
Soins prventifs pour les hommes gs de 59  64 ans Preventive Care 21-59 Years Old, Male Les soins prventifs dsignent les choix de mode de vie et les visites chez votre prestataire de soins de sant qui peuvent favoriser votre sant et votre bien-tre. Les visites de soins prventifs sont galement appeles  bilans de sant . Que puis-je attendre de ma visite de soins prventifs ? Consultation Lors de votre visite de soins prventifs, votre prestataire de soins de sant pourra vous poser des questions sur : Vos antcdents mdicaux, notamment : Les problmes de sant que vous avez eus. Les antcdents mdicaux de votre famille. Votre tat de sant au moment de la visite, notamment : Votre bien-tre motionnel. Votre vie de famille et votre bien-tre relationnel. Votre activit sexuelle. Votre mode de vie, notamment : Votre consommation d'alcool, de nicotine ou de tabac et de drogues. Votre accs aux armes  feu. Votre rgime alimentaire, vos activits sportives et vos habitudes de sommeil. Les [employer] de scurit que vous prenez, Radio producer port de la ceinture de scurit ou le port d'un casque lorsque vous faites du vlo. L'utilisation que vous faites de la crme [employer]. Votre travail et votre environnement de [place]. Examen physique Votre prestataire de soins de sant vrifiera : Votre taille et [employer]. Ceux-ci pourront permettre de calculer votre indice de masse corporelle [facility]). L'IMC est une mesure qui indique si votre poids est un poids sain. Votre tour de [place]. Il s'agit de la [employer] tour de Marine scientist. Cette mesure indique galement si votre poids est correct et peut aider  valuer votre risque de dvelopper certaines maladies, comme le diabte de type 2 et l'hypertension artrielle. Votre frquence cardiaque et votre tension artrielle. Votre temprature. Votre peau pour dceler d'ventuelles taches anormales. De quelles vaccinations ai-je besoin ?  Les  vaccins sont gnralement administrs  diffrents ges, selon un calendrier tabli. Votre prestataire de soins de sant vous recommandera des vaccins selon votre ge, vos antcdents mdicaux, votre mode de vie ou d'autres facteurs, par [employer], l'endroit o vous travaillez ou un voyage que vous auriez prvu. De quels tests ai-je besoin ? Dpistage Votre prestataire de soins de sant pourrait vous recommander de passer des tests de dpistage pour [employer]. Notamment : L'analyse des taux de lipides et de cholestrol. Un test de dpistage du diabte. Ce dpistage est effectu en mesurant votre taux de sucre sanguin (glucose)  un moment o vous n'avez pas mang depuis un certain temps ( jeun). Un test de l'hpatite B. Un test de l'hpatite C. Un test de dpistage du VIH (virus de l'immunodficience humaine). Des tests de dpistage des IST (infections sexuellement transmissibles), si vous prsentez un risque. Un test de dpistage du cancer du poumon. Un test de dpistage du cancer de la prostate. Un test de dpistage du cancer colorectal. Discutez avec votre prestataire de soins de sant des rsultats des tests, des options de traitement et si ncessaire, de la ncessit d'effectuer d'autres tests. Suivez les instructions suivantes  domicile : Alimentation et boissons  Adoptez une alimentation contenant des lgumes et des fruits frais, des crales compltes, des protines maigres et des produits laitiers crms. Prenez des supplments vitaminiques et minraux, conformment Psychologist, educational de votre prestataire de soins de sant. Ne buvez pas d'alcool si votre prestataire de soins de sant vous l'interdit. Si vous consommez de l'alcool : Arboriculturist  2 verres d'alcool par [employer]. Sachez quelle quantit d'alcool est contenue dans votre verre. Aux .-U., un  verre correspond  une bouteille de bire (355 ml [12 onces]),  un verre de vin (148 ml [5 onces]) ou   un verre  liqueur d'alcool fort (44 ml [1,5 once]). Mode de vie Brossez-vous les dents avec un dentifrice fluor tous les matins et tous les soirs. Utilisez du fil dentaire une fois par jour. Faites de l'exercice au moins 30 minutes par jour, au moins 5 jours par [employer]. N'utilisez pas de produits contenant de la nicotine ou du tabac. Il s'agit notamment des cigarettes, du tabac  mcher et des vapoteuses, comme les cigarettes lectroniques. Si vous avez besoin d'aide pour arrter de fumer, demandez conseil  votre prestataire de soins de sant. Ne consommez pas de drogues. Si vous tes sexuellement actif, ayez des rapports sexuels protgs. Lisbeth Renshaw un prservatif ou une autre forme de protection afin d'viter les IST. Ne prenez de l'aspirine que lorsque votre prestataire de soins de sant vous le recommande. Vrifiez que vous avez bien compris quelle dose vous devez prendre et comment. Faites appel  votre prestataire de soins de sant pour savoir s'il serait utile que vous preniez de l'aspirine quotidiennement et si vous pourriez Therapist, music sans danger. Trouvez des solutions saines pour grer votre stress, comme : Marine scientist, le yoga ou couter de la musique. Tenir un journal. Milus Height personne de confiance. Passer du [employer] ses amis et sa famille. Pour rduire [employer] de dvelopper un cancer de la peau, vitez de vous exposer aux rayons UV. Scurit Portez toujours votre ceinture de scurit lorsque vous conduisez ou lorsque vous tes  bord d'un vhicule. Ne conduisez pas : Si vous avez bu de l'alcool. Ne montez pas  bord d'un vhicule conduit par [employer] a bu. Lorsque vous tes fatigu ou distrait. En tapant un message. Si vous avez consomm des substances ou pris des Merrill Lynch altrent votre tat d'esprit. Portez un casque ou tout autre quipement de Counsellor de la pratique d'activits sportives. Si vous avez des armes  feu dans votre  maison, assurez-vous de Artist toutes les mesures relatives  l'utilisation des armes. Quelle est la prochaine tape ? Consultez votre prestataire de soins de sant une fois par an pour un bilan de sant. Demandez  votre prestataire de soins de sant  quelle frquence vous devriez Actor vos yeux et vos dents. Veillez  tre  jour de tous vos vaccins. Ces conseils et renseignements ne sauraient se substituer  l'avis mdical de votre prestataire de soins de sant. Par consquent, il est primordial de parler de toutes vos proccupations avec votre prestataire de soins de sant. Document Revised: 04/10/2021 Document Reviewed: 04/10/2021 Elsevier Patient Education  2024 [employer].

## 2023-09-13 ENCOUNTER — Other Ambulatory Visit: Payer: Self-pay

## 2023-09-13 DIAGNOSIS — E782 Mixed hyperlipidemia: Secondary | ICD-10-CM

## 2023-09-13 DIAGNOSIS — E1169 Type 2 diabetes mellitus with other specified complication: Secondary | ICD-10-CM

## 2023-09-13 LAB — LIPID PANEL
Cholesterol: 220 mg/dL — ABNORMAL HIGH (ref 0–200)
HDL: 32.8 mg/dL — ABNORMAL LOW (ref >39.00)
LDL Cholesterol: 116 mg/dL — ABNORMAL HIGH (ref 0–99)
NonHDL: 187.56
Total CHOL/HDL Ratio: 7
Triglycerides: 360 mg/dL — ABNORMAL HIGH (ref 0.0–149.0)
VLDL: 72 mg/dL — ABNORMAL HIGH (ref 0.0–40.0)

## 2023-09-13 LAB — CBC WITH DIFFERENTIAL/PLATELET
Basophils Absolute: 0.2 10*3/uL — ABNORMAL HIGH (ref 0.0–0.1)
Basophils Relative: 1.4 % (ref 0.0–3.0)
Eosinophils Absolute: 0.3 10*3/uL (ref 0.0–0.7)
Eosinophils Relative: 2.5 % (ref 0.0–5.0)
HCT: 52.4 % — ABNORMAL HIGH (ref 39.0–52.0)
Hemoglobin: 17.3 g/dL — ABNORMAL HIGH (ref 13.0–17.0)
Lymphocytes Relative: 21.8 % (ref 12.0–46.0)
Lymphs Abs: 2.4 10*3/uL (ref 0.7–4.0)
MCHC: 33.1 g/dL (ref 30.0–36.0)
MCV: 92.9 fL (ref 78.0–100.0)
Monocytes Absolute: 0.8 10*3/uL (ref 0.1–1.0)
Monocytes Relative: 6.8 % (ref 3.0–12.0)
Neutro Abs: 7.4 10*3/uL (ref 1.4–7.7)
Neutrophils Relative %: 67.5 % (ref 43.0–77.0)
Platelets: 220 10*3/uL (ref 150.0–400.0)
RBC: 5.64 Mil/uL (ref 4.22–5.81)
RDW: 14.2 % (ref 11.5–15.5)
WBC: 11 10*3/uL — ABNORMAL HIGH (ref 4.0–10.5)

## 2023-09-13 LAB — COMPREHENSIVE METABOLIC PANEL
ALT: 39 U/L (ref 0–53)
AST: 29 U/L (ref 0–37)
Albumin: 4.4 g/dL (ref 3.5–5.2)
Alkaline Phosphatase: 59 U/L (ref 39–117)
BUN: 17 mg/dL (ref 6–23)
CO2: 25 meq/L (ref 19–32)
Calcium: 9.3 mg/dL (ref 8.4–10.5)
Chloride: 103 meq/L (ref 96–112)
Creatinine, Ser: 1.34 mg/dL (ref 0.40–1.50)
GFR: 57.87 mL/min — ABNORMAL LOW (ref >60.00)
Glucose, Bld: 128 mg/dL — ABNORMAL HIGH (ref 70–99)
Potassium: 4.2 meq/L (ref 3.5–5.1)
Sodium: 140 meq/L (ref 135–145)
Total Bilirubin: 0.5 mg/dL (ref 0.2–1.2)
Total Protein: 6.6 g/dL (ref 6.0–8.3)

## 2023-09-13 LAB — TSH: TSH: 1.05 u[IU]/mL (ref 0.35–5.50)

## 2023-09-13 LAB — PSA: PSA: 1.18 ng/mL (ref 0.10–4.00)

## 2023-09-19 ENCOUNTER — Other Ambulatory Visit: Payer: BC Managed Care – PPO

## 2023-09-20 LAB — COMPREHENSIVE METABOLIC PANEL
AG Ratio: 1.8 (calc) (ref 1.0–2.5)
ALT: 35 U/L (ref 9–46)
AST: 25 U/L (ref 10–35)
Albumin: 4.5 g/dL (ref 3.6–5.1)
Alkaline phosphatase (APISO): 62 U/L (ref 35–144)
BUN: 13 mg/dL (ref 7–25)
CO2: 22 mmol/L (ref 20–32)
Calcium: 9.3 mg/dL (ref 8.6–10.3)
Chloride: 105 mmol/L (ref 98–110)
Creat: 0.9 mg/dL (ref 0.70–1.30)
Globulin: 2.5 g/dL (ref 1.9–3.7)
Glucose, Bld: 102 mg/dL — ABNORMAL HIGH (ref 65–99)
Potassium: 4.7 mmol/L (ref 3.5–5.3)
Sodium: 138 mmol/L (ref 135–146)
Total Bilirubin: 0.6 mg/dL (ref 0.2–1.2)
Total Protein: 7 g/dL (ref 6.1–8.1)

## 2023-09-20 LAB — LIPID PANEL
Cholesterol: 197 mg/dL (ref <200)
HDL: 35 mg/dL — ABNORMAL LOW (ref >=40)
LDL Cholesterol (Calc): 128 mg/dL — ABNORMAL HIGH
Non-HDL Cholesterol (Calc): 162 mg/dL — ABNORMAL HIGH (ref <130)
Total CHOL/HDL Ratio: 5.6 (calc) — ABNORMAL HIGH (ref <5.0)
Triglycerides: 196 mg/dL — ABNORMAL HIGH (ref <150)

## 2023-09-20 LAB — HEMOGLOBIN A1C
Hgb A1c MFr Bld: 7.3 %{Hb} — ABNORMAL HIGH (ref <5.7)
Mean Plasma Glucose: 163 mg/dL
eAG (mmol/L): 9 mmol/L

## 2023-09-21 ENCOUNTER — Other Ambulatory Visit: Payer: Self-pay

## 2023-09-21 DIAGNOSIS — E782 Mixed hyperlipidemia: Secondary | ICD-10-CM

## 2023-09-26 ENCOUNTER — Ambulatory Visit: Payer: BC Managed Care – PPO | Admitting: Endocrinology

## 2023-09-26 DIAGNOSIS — Z794 Long term (current) use of insulin: Secondary | ICD-10-CM

## 2023-09-26 DIAGNOSIS — E1165 Type 2 diabetes mellitus with hyperglycemia: Secondary | ICD-10-CM

## 2023-09-26 MED ORDER — EMPAGLIFLOZIN 25 MG PO TABS: ORAL_TABLET | ORAL | 3 refills | Status: DC

## 2023-09-26 NOTE — Progress Notes (Unsigned)
Outpatient Endocrinology Note Greg Taylynn Easton, MD   Patient's Name: Greg Kane    DOB: 1966-09-08    MRN: [id number]                                                    REASON OF VISIT: New consult / Follow up for type *** diabetes mellitus  REFERRING PROVIDER:   PCP: Greg Kane, Greg Congress, DO  HISTORY OF PRESENT ILLNESS:   Greg Kane is a 57 y.o. old male with past medical history listed below, is here for new consult / follow up for type *** diabetes mellitus.   Pertinent Diabetes History: _Diagnosed as Diabetes Mellitus type ***  in ***, at the age of *** years.  Prior therapy: Initially managed with *** and insulin started in ***  History of DKA or diabetes related hospitalizations: ***none  Previous diabetes education: Yes ***  Family h/o diabetes mellitus: ***   No personal history of pancreatitis and / or family history of medullary thyroid carcinoma or MEN 2B syndrome. ***  Chronic Diabetes Complications : Retinopathy: no. Last ophthalmology exam was done on 04/2023, following with ophthalmology regularly.  Nephropathy: ***, on ACE/ARB *** Peripheral neuropathy: ***, on *** Coronary artery disease: *** Stroke: ***  Relevant comorbidities and cardiovascular risk factors: Obesity: *** Body mass index is 38.96 kg/m.  Hypertension: Yes *** Hyperlipidemia : Yes, on statin ***  Current / Home Diabetic regimen includes:   Tresiba 20 units in a.m.; non-insulin drugs: Ozempic 2 mg weekly, Jardiance 25 mg, metformin 2000 mg   Prior diabetic medications:  Glycemic data:    CONTINUOUS GLUCOSE MONITORING SYSTEM (CGMS) INTERPRETATION: At today's visit, we reviewed CGM downloads. The full report is scanned in the media. Reviewing the CGM trends, blood glucose are as follows:  Dexcom G7 CGM-  Sensor Download (Sensor download was reviewed and summarized below.) Dates: ***  Glucose Management Indicator: ***% Sensor Average: *** SD *** Sensor usage : ***  %  Glycemic Trends:  <54: ***% 54-70: ***% 71-180: ***% 181-250: ***% 251-400: ***%  Interpretation: - ***  FreeStyle Libre 3 CGM-  Sensor Download (Sensor download was reviewed and summarized below.) Dates: December 10 to September 26, 2023, 14 days  Glucose Management Indicator: 7%  Mostly acceptable blood sugar with occasional postprandial hyperglycemia with blood sugar up to 200 range.  No hypoglycemia.  No blood sugar data on December 16, 17, 18.  GMI 7%.  Interpretation: - ***  Mainor's glucose meter is Location manager. Raw data and trends analyzed.   -  He has been testing his blood glucoses *** times daily.  -  Average glucose for the last *** days is *** mg/dl, range *** - ***. -  Trends noted: ***   Hypoglycemia: Patient has *** hypoglycemic episodes. Patient has hypoglycemia awareness, has a glucagon ER kit and diabetic alert device ***.   Factors modifying glucose control: 1.  Diabetic diet assessment: ***  2.  Staying active or exercising: ***  3.  Medication compliance: compliant *** of the time.  Interval history  ***  REVIEW OF SYSTEMS As per history of present illness.   PAST MEDICAL HISTORY: Past Medical History:  Diagnosis Date   Allergy    Bowel obstruction (HCC) 10/2011   Diabetes mellitus    Gout    Hx of adenomatous  polyp of colon 01/22/2015   Hyperlipidemia    Hypertension    Umbilical hernia     PAST SURGICAL HISTORY: Past Surgical History:  Procedure Laterality Date   APPENDECTOMY     1980s   COLONOSCOPY W/ POLYPECTOMY  01/2015   LUMBAR SPINE SURGERY  2018   dr Greg Kane    ALLERGIES: Allergies  Allergen Reactions   Penicillins Anaphylaxis   Pneumococcal Vaccines Other (See Comments)    Cellulitis, swelling    FAMILY HISTORY:  Family History  Problem Relation Age of Onset   Diabetes Mother    Hyperlipidemia Father    Hypertension Father    Dementia Father    Colon cancer Neg Hx    Esophageal cancer Neg Hx     Stomach cancer Neg Hx    Rectal cancer Neg Hx     SOCIAL HISTORY: Social History   Socioeconomic History   Marital status: Married    Spouse name: Not on file   Number of children: Not on file   Years of education: Not on file   Highest education level: Not on file  Occupational History   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  Tobacco Use   Smoking status: Former    Current packs/day: 0.00    Average packs/day: 0.3 packs/day for 38.0 years (9.5 ttl pk-yrs)    Types: Cigarettes, Cigars    Start date: 04/29/1984    Quit date: 04/29/2022    Years since quitting: 1.4   Smokeless tobacco: Former   Tobacco comments:    pt doesn't buy cig-- borrows from others no more than 4 cig a week---quit 1 week ago (July 2023)  Substance and Sexual Activity   Alcohol use: Yes    Alcohol/week: 0.0 standard drinks of alcohol    Comment: occas   Drug use: No   Sexual activity: Yes  Other Topics Concern   Not on file  Social History Narrative   Married   Children 3   Manufacturing engineer   Exercise -a little      Social Drivers of Corporate investment banker Strain: Not on file  Food Insecurity: Not on file  Transportation Needs: Not on file  Physical Activity: Not on file  Stress: Not on file  Social Connections: Not on file    MEDICATIONS:  Current Outpatient Medications  Medication Sig Dispense Refill   allopurinol (ZYLOPRIM) 100 MG tablet Take 1 tablet (100 mg total) by mouth 2 (two) times daily as needed. For gout 180 tablet 1   Continuous Glucose Sensor (FREESTYLE LIBRE 3 SENSOR) MISC 1 Device by Continuous infusion (non-IV) route every 14 (fourteen) days. 2 each 3   gabapentin (NEURONTIN) 300 MG capsule Take 1 capsule 3 times a day as needed 270 capsule 1   insulin degludec (TRESIBA FLEXTOUCH) 100 UNIT/ML FlexTouch Pen Inject 0.16ml (20units) subcutaneously daily 30 mL 0   Insulin Pen Needle 32G X 4 MM MISC Use to inject insulin once daily 90 each 1   metFORMIN (GLUCOPHAGE-XR) 500  MG 24 hr tablet TAKE 2 TABLETS TWICE A DAY 360 tablet 1   Pitavastatin Calcium 4 MG TABS Take 1 tablet (4 mg total) by mouth daily. 30 tablet 3   Semaglutide, 2 MG/DOSE, (OZEMPIC, 2 MG/DOSE,) 8 MG/3ML SOPN DIAL AND INJECT UNDER THE SKIN 2 MG WEEKLY 3 mL 2   empagliflozin (JARDIANCE) 25 MG TABS tablet TAKE 1 TABLET(25 MG) BY MOUTH DAILY 90 tablet 3   Current Facility-Administered Medications  Medication Dose Route Frequency  Provider Last Rate Last Admin   0.9 %  sodium chloride infusion  500 mL Intravenous Once Iva Boop, MD        PHYSICAL EXAM: Vitals:   09/26/23 1457  BP: 110/70  Pulse: (!) 112  Resp: 20  SpO2: 95%  Weight: 275 lb 6.4 oz (124.9 kg)  Height: 5' 10.5" (1.791 m)   Body mass index is 38.96 kg/m.  Wt Readings from Last 3 Encounters:  09/26/23 275 lb 6.4 oz (124.9 kg)  09/12/23 279 lb (126.6 kg)  05/24/23 272 lb (123.4 kg)    General: Well developed, well nourished male in no apparent distress.  HEENT: AT/[place], no external lesions.  Eyes: Conjunctiva clear and no icterus. Neck: Neck supple  Lungs: Respirations not labored Neurologic: Alert, oriented, normal speech Extremities / Skin: Dry. No sores or rashes noted. No acanthosis nigricans Psychiatric: Does not appear depressed or anxious  Diabetic Foot Exam: Monofilament sensory exam intact / decreased b/l, no callus, no ulceration, Dorsalis Pedis 2+ b/l  Diabetic Foot Exam - Simple   No data filed     Diabetic Foot Examination {AMB DIABETIC FOOT NFAO:13086}  LABS Reviewed Lab Results  Component Value Date   HGBA1C 7.3 (H) 09/19/2023   HGBA1C 6.6 (H) 05/20/2023   HGBA1C 6.6 (H) 02/15/2023   Lab Results  Component Value Date   FRUCTOSAMINE 242 07/07/2017   FRUCTOSAMINE 241 11/09/2016   FRUCTOSAMINE 253 10/02/2013   Lab Results  Component Value Date   CHOL 197 09/19/2023   HDL 35 (L) 09/19/2023   LDLCALC 128 (H) 09/19/2023   LDLDIRECT 92.0 06/10/2021   TRIG 196 (H) 09/19/2023   CHOLHDL  5.6 (H) 09/19/2023   Lab Results  Component Value Date   MICRALBCREAT 1.0 02/15/2023   MICRALBCREAT 1.0 01/19/2022   Lab Results  Component Value Date   CREATININE 0.90 09/19/2023   Lab Results  Component Value Date   GFR 57.87 (L) 09/12/2023    ASSESSMENT / PLAN  1. Uncontrolled type 2 diabetes mellitus with hyperglycemia, with long-term current use of insulin (HCC)     Diabetes Mellitus type ***, complicated by *** - Diabetic status / severity: ***  Lab Results  Component Value Date   HGBA1C 7.3 (H) 09/19/2023    - Hemoglobin A1c goal : <***%  - Medications: ***  I) II) III) IV)  - Home glucose testing: *** - Discussed/ Gave Hypoglycemia treatment plan.  # Consult : not required at this time. ***  # Annual urine for microalbuminuria/ creatinine ratio, no microalbuminuria currently, continue ACE/ARB *** Last  Lab Results  Component Value Date   MICRALBCREAT 1.0 02/15/2023    # Foot check nightly / neuropathy, continue ***  # Annual dilated diabetic eye exams.   - Diet: {dmclinicdiabeticdiettype:42746::"Make healthy diabetic food choices","Eat reasonable portion sizes to promote a healthy weight"} - Life style / activity / exercise: {dmclinicactivity:42747}  2. Blood pressure  -  BP Readings from Last 1 Encounters:  09/26/23 110/70    - Control {IS/IS NOT:9024::"is"} in target.  - {endo rb hypertension recommendations:35631::"No change in current plans."}  3. Lipid status / Hyperlipidemia - Last  Lab Results  Component Value Date   LDLCALC 128 (H) 09/19/2023   - Continue ***   Diagnoses and all orders for this visit:  Uncontrolled type 2 diabetes mellitus with hyperglycemia, with long-term current use of insulin (HCC) -     empagliflozin (JARDIANCE) 25 MG TABS tablet; TAKE 1 TABLET(25 MG) BY MOUTH DAILY  DISPOSITION Follow up in clinic in   months suggested.   All questions answered and patient verbalized understanding of the  plan.  Greg Reeva Davern, MD [facility] Endocrinology [facility] [address], [address] [place], [place] [address] Phone # [phone]  At least part of this note was generated using voice recognition software. Inadvertent word errors may have occurred, which were not recognized during the proofreading process.

## 2023-09-27 ENCOUNTER — Encounter: Payer: Self-pay | Admitting: Endocrinology

## 2023-09-29 ENCOUNTER — Encounter: Payer: Self-pay | Admitting: Endocrinology

## 2023-10-22 ENCOUNTER — Other Ambulatory Visit: Payer: Self-pay | Admitting: Endocrinology

## 2023-10-22 DIAGNOSIS — E11649 Type 2 diabetes mellitus with hypoglycemia without coma: Secondary | ICD-10-CM

## 2023-10-24 ENCOUNTER — Encounter: Payer: Self-pay | Admitting: Internal Medicine

## 2023-12-21 ENCOUNTER — Telehealth: Payer: Self-pay

## 2023-12-21 ENCOUNTER — Other Ambulatory Visit (HOSPITAL_COMMUNITY): Payer: Self-pay

## 2023-12-21 NOTE — Telephone Encounter (Signed)
 Pharmacy Patient Advocate Encounter   Received notification from CoverMyMeds that prior authorization for Ozempic is required/requested.   Insurance verification completed.   The patient is insured through [facility] .   Per test claim: PA required; PA submitted to above mentioned insurance via CoverMyMeds Key/confirmation #/EOC [id number] Status is pending

## 2023-12-27 NOTE — Telephone Encounter (Signed)
 Approved from 12/21/2023-12/21/2026

## 2024-01-09 ENCOUNTER — Other Ambulatory Visit: Payer: Self-pay

## 2024-01-12 ENCOUNTER — Other Ambulatory Visit: Payer: Self-pay | Admitting: Endocrinology

## 2024-01-12 DIAGNOSIS — E1165 Type 2 diabetes mellitus with hyperglycemia: Secondary | ICD-10-CM

## 2024-01-19 ENCOUNTER — Other Ambulatory Visit: Payer: BC Managed Care – PPO

## 2024-01-20 LAB — BASIC METABOLIC PANEL WITH GFR
BUN: 19 mg/dL (ref 7–25)
CO2: 25 mmol/L (ref 20–32)
Calcium: 9.9 mg/dL (ref 8.6–10.3)
Chloride: 104 mmol/L (ref 98–110)
Creat: 1.12 mg/dL (ref 0.70–1.35)
Glucose, Bld: 99 mg/dL (ref 65–99)
Potassium: 4.7 mmol/L (ref 3.5–5.3)
Sodium: 139 mmol/L (ref 135–146)
eGFR: 75 mL/min/{1.73_m2} (ref >=60)

## 2024-01-20 LAB — HEMOGLOBIN A1C
Hgb A1c MFr Bld: 7.2 % — ABNORMAL HIGH (ref <5.7)
Mean Plasma Glucose: 160 mg/dL
eAG (mmol/L): 8.9 mmol/L

## 2024-01-20 LAB — MICROALBUMIN / CREATININE URINE RATIO
Creatinine, Urine: 92 mg/dL (ref 20–320)
Microalb Creat Ratio: 3 mg/g{creat} (ref <30)
Microalb, Ur: 0.3 mg/dL

## 2024-01-23 ENCOUNTER — Encounter: Payer: Self-pay | Admitting: Endocrinology

## 2024-01-25 ENCOUNTER — Ambulatory Visit: Payer: BC Managed Care – PPO | Admitting: Endocrinology

## 2024-01-25 ENCOUNTER — Encounter: Payer: Self-pay | Admitting: Endocrinology

## 2024-01-25 VITALS — BP 132/70 | HR 92 | Resp 20 | Ht 70.5 in | Wt 273.8 lb

## 2024-01-25 DIAGNOSIS — Z794 Long term (current) use of insulin: Secondary | ICD-10-CM | POA: Diagnosis not present

## 2024-01-25 DIAGNOSIS — E1165 Type 2 diabetes mellitus with hyperglycemia: Secondary | ICD-10-CM

## 2024-01-25 NOTE — Progress Notes (Signed)
 Outpatient Endocrinology Note [name], MD   Patient's Name: Greg Kane    DOB: 1966-05-21    MRN: [id number]                                                    REASON OF VISIT: Follow up for type 2 diabetes mellitus  PCP: Crecencio Dodge, Candida Chalk, DO  HISTORY OF PRESENT ILLNESS:   Greg Kane is a 58 y.o. old male with past medical history listed below, is here for follow up for type 2 diabetes mellitus.   Pertinent Diabetes History: Patient was previously seen by Dr. Hubert Madden and was last time seen in August 2024.  Patient was diagnosed with type 2 diabetes mellitus in 2008.  Chronic Diabetes Complications : Retinopathy: no. Last ophthalmology exam was done on 04/2023, following with ophthalmology regularly.  Nephropathy: no Peripheral neuropathy: yes,  He has a history of painful burning sensation in his feet, mainly at night  Symptoms are nearly resolved, still not needing gabapentin  , on gabapentin  as needed.  Coronary artery disease: no Stroke: no  Relevant comorbidities and cardiovascular risk factors: Obesity: yes Body mass index is 38.73 kg/m.  Hypertension: Yes  Hyperlipidemia : Yes, on statin.  Hyperlipidemia: He has mixed hyperlipidemia with increased LDL and persistently low HDL Previously on Lipitor which caused muscle aches. He had complained that 10 mg Crestor  every day was causing heaviness and swelling in his legs which improved with stopping the medication.   However he is tolerating Livalo  4 mg very well and LDL is improved.  Current / Home Diabetic regimen includes:  Tresiba  20 units in a.m.; non-insulin  drugs: Ozempic  2 mg weekly, Jardiance  25 mg, metformin  XR 2000 mg.   Prior diabetic medications: Actos , Invokana , Victoza  in the past.  Glycemic data:    CONTINUOUS GLUCOSE MONITORING SYSTEM (CGMS) INTERPRETATION: At today's visit, we reviewed CGM downloads. The full report is scanned in the media. Reviewing the CGM trends, blood glucose are as  follows:  FreeStyle Libre 3 CGM-  Sensor Download (Sensor download was reviewed and summarized below.) Dates: April 10 to January 25, 2024, 14 days  Glucose Management Indicator: 6.5%     Interpretation: Mostly acceptable blood sugar reorder postprandial hyperglycemia with blood sugar up to 200 range.  Blood sugar overnight and early morning acceptable.  No hypoglycemia.  Hypoglycemia: Patient has no hypoglycemic episodes. Patient has hypoglycemia awareness.  Factors modifying glucose control: 1.  Diabetic diet assessment: 3 meals a day.  2.  Staying active or exercising: Going to gym, regular exercise.  3.  Medication compliance: compliant all of the time.  Interval history  Patient tried no insulin  for about 4 weeks however he started to notice high blood sugar and restarted taking Tresiba  20 units daily.  Diabetes regimen as reviewed and noted above.  GMI on CGM 6.5%.  Hemoglobin A1c 7.2%.  Overall acceptable blood sugar.  He no longer has numbness and tingling of the feet.  No other complaints today.  He has been tolerating Ozempic  well.  Lately not able to exercise due to back pain.   REVIEW OF SYSTEMS As per history of present illness.   PAST MEDICAL HISTORY: Past Medical History:  Diagnosis Date   Allergy    Bowel obstruction (HCC) 10/2011   Diabetes mellitus    Gout  Hx of adenomatous polyp of colon 01/22/2015   Hyperlipidemia    Hypertension    Umbilical hernia     PAST SURGICAL HISTORY: Past Surgical History:  Procedure Laterality Date   APPENDECTOMY     1980s   COLONOSCOPY W/ POLYPECTOMY  01/2015   LUMBAR SPINE SURGERY  2018   dr Rochelle Chu    ALLERGIES: Allergies  Allergen Reactions   Penicillins Anaphylaxis   Pneumococcal Vaccines Other (See Comments)    Cellulitis, swelling    FAMILY HISTORY:  Family History  Problem Relation Age of Onset   Diabetes Mother    Hyperlipidemia Father    Hypertension Father    Dementia Father    Colon cancer  Neg Hx    Esophageal cancer Neg Hx    Stomach cancer Neg Hx    Rectal cancer Neg Hx     SOCIAL HISTORY: Social History   Socioeconomic History   Marital status: Married    Spouse name: Not on file   Number of children: Not on file   Years of education: Not on file   Highest education level: Not on file  Occupational History   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  Tobacco Use   Smoking status: Former    Current packs/day: 0.00    Average packs/day: 0.3 packs/day for 38.0 years (9.5 ttl pk-yrs)    Types: Cigarettes, Cigars    Start date: 04/29/1984    Quit date: 04/29/2022    Years since quitting: 1.7   Smokeless tobacco: Former   Tobacco comments:    pt doesn't buy cig-- borrows from others no more than 4 cig a week---quit 1 week ago (July 2023)  Substance and Sexual Activity   Alcohol use: Yes    Alcohol/week: 0.0 standard drinks of alcohol    Comment: occas   Drug use: No   Sexual activity: Yes  Other Topics Concern   Not on file  Social History Narrative   Married   Children 3   Manufacturing engineer   Exercise -a little      Social Drivers of Corporate investment banker Strain: Not on file  Food Insecurity: Not on file  Transportation Needs: Not on file  Physical Activity: Not on file  Stress: Not on file  Social Connections: Not on file    MEDICATIONS:  Current Outpatient Medications  Medication Sig Dispense Refill   allopurinol  (ZYLOPRIM ) 100 MG tablet Take 1 tablet (100 mg total) by mouth 2 (two) times daily as needed. For gout 180 tablet 1   Continuous Glucose Sensor (FREESTYLE LIBRE 3 SENSOR) MISC APPLY SENSOR TO SKIN FOR CONTINUOUS BLOOD SUGAR MONITORING, REPLACE EVERY 14 DAYS 6 each 3   empagliflozin  (JARDIANCE ) 25 MG TABS tablet TAKE 1 TABLET(25 MG) BY MOUTH DAILY 90 tablet 3   gabapentin  (NEURONTIN ) 300 MG capsule Take 1 capsule 3 times a day as needed 270 capsule 1   insulin  degludec (TRESIBA  FLEXTOUCH) 100 UNIT/ML FlexTouch Pen Inject 0.39ml (20units)  subcutaneously daily 30 mL 0   Insulin  Pen Needle 32G X 4 MM MISC Use to inject insulin  once daily 90 each 1   metFORMIN  (GLUCOPHAGE -XR) 500 MG 24 hr tablet TAKE 2 TABLETS TWICE A DAY 360 tablet 1   OZEMPIC , 2 MG/DOSE, 8 MG/3ML SOPN DIAL AND INJECT UNDER THE SKIN 2 MG WEEKLY 3 mL 2   Pitavastatin  Calcium  4 MG TABS TAKE 1 TABLET BY MOUTH DAILY 30 tablet 3   Current Facility-Administered Medications  Medication Dose Route Frequency Provider  Last Rate Last Admin   0.9 %  sodium chloride  infusion  500 mL Intravenous Once Kenney Peacemaker, MD        PHYSICAL EXAM: Vitals:   01/25/24 1345  BP: 132/70  Pulse: 92  Resp: 20  SpO2: 95%  Weight: 273 lb 12.8 oz (124.2 kg)  Height: 5' 10.5" (1.791 m)    Body mass index is 38.73 kg/m.  Wt Readings from Last 3 Encounters:  01/25/24 273 lb 12.8 oz (124.2 kg)  09/26/23 275 lb 6.4 oz (124.9 kg)  09/12/23 279 lb (126.6 kg)    General: Well developed, well nourished male in no apparent distress.  HEENT: AT/[place], no external lesions.  Eyes: Conjunctiva clear and no icterus. Neck: Neck supple  Lungs: Respirations not labored Neurologic: Alert, oriented, normal speech Extremities / Skin: Dry. No sores or rashes noted.  Psychiatric: Does not appear depressed or anxious  Diabetic Foot Exam - Simple   Simple Foot Form Diabetic Foot exam was performed with the following findings: Yes 01/25/2024  1:56 PM  Visual Inspection No deformities, no ulcerations, no other skin breakdown bilaterally: Yes Sensation Testing Intact to touch and monofilament testing bilaterally: Yes Pulse Check Posterior Tibialis and Dorsalis pulse intact bilaterally: Yes Comments     LABS Reviewed Lab Results  Component Value Date   HGBA1C 7.2 (H) 01/19/2024   HGBA1C 7.3 (H) 09/19/2023   HGBA1C 6.6 (H) 05/20/2023   Lab Results  Component Value Date   FRUCTOSAMINE 242 07/07/2017   FRUCTOSAMINE 241 11/09/2016   FRUCTOSAMINE 253 10/02/2013   Lab Results  Component  Value Date   CHOL 197 09/19/2023   HDL 35 (L) 09/19/2023   LDLCALC 128 (H) 09/19/2023   LDLDIRECT 92.0 06/10/2021   TRIG 196 (H) 09/19/2023   CHOLHDL 5.6 (H) 09/19/2023   Lab Results  Component Value Date   MICRALBCREAT 3 01/19/2024   MICRALBCREAT 1.0 02/15/2023   Lab Results  Component Value Date   CREATININE 1.12 01/19/2024   Lab Results  Component Value Date   GFR 57.87 (L) 09/12/2023    ASSESSMENT / PLAN  1. Uncontrolled type 2 diabetes mellitus with hyperglycemia, with long-term current use of insulin  (HCC)     Diabetes Mellitus type 2, complicated by diabetic neuropathy. - Diabetic status / severity: Uncontrolled.  Lab Results  Component Value Date   HGBA1C 7.2 (H) 01/19/2024    - Hemoglobin A1c goal : <6.5%  Mostly acceptable blood sugar as per CGM data.  GMI 6.5%.  - Medications: No change.  Tresiba  20 units in a.m.; non-insulin  drugs: Ozempic  2 mg weekly, Jardiance  25 mg, metformin  XR 2000 mg.   Will consider gradually decreasing dose of insulin /basal insulin .  - Home glucose testing: CGM and check as needed.  - Discussed/ Gave Hypoglycemia treatment plan.  # Consult : not required at this time.   # Annual urine for microalbuminuria/ creatinine ratio, no microalbuminuria currently. Last  Lab Results  Component Value Date   MICRALBCREAT 3 01/19/2024    # Foot check nightly / neuropathy, continue gabapentin  as needed.  # Annual dilated diabetic eye exams.   - Diet: Make healthy diabetic food choices - Life style / activity / exercise: Discussed.  2. Blood pressure  -  BP Readings from Last 1 Encounters:  01/25/24 132/70    - Control is in target.  - No change in current plans.  3. Lipid status / Hyperlipidemia - Last  Lab Results  Component Value Date   [facility]  128 (H) 09/19/2023   - Continue pitavastatin  4 mg daily. - Will recheck lipid panel in follow-up visit.  Diagnoses and all orders for this visit:  Uncontrolled type 2  diabetes mellitus with hyperglycemia, with long-term current use of insulin  (HCC) -     Basic Metabolic Panel Without GFR -     Lipid panel -     Hemoglobin A1c     DISPOSITION Follow up in clinic in 4 months suggested.  Labs prior to follow-up visit as ordered.   All questions answered and patient verbalized understanding of the plan.  [place] Jennetta Flood, MD [facility] Endocrinology [facility] [address] [place], [address] [place], [place] [address] Phone # [phone]  At least part of this note was generated using voice recognition software. Inadvertent word errors may have occurred, which were not recognized during the proofreading process.

## 2024-01-30 ENCOUNTER — Encounter: Payer: Self-pay | Admitting: Endocrinology

## 2024-02-13 ENCOUNTER — Other Ambulatory Visit: Payer: Self-pay | Admitting: Endocrinology

## 2024-04-08 ENCOUNTER — Other Ambulatory Visit: Payer: Self-pay | Admitting: Endocrinology

## 2024-04-08 DIAGNOSIS — E1165 Type 2 diabetes mellitus with hyperglycemia: Secondary | ICD-10-CM

## 2024-04-09 NOTE — Telephone Encounter (Signed)
 Refill request complete

## 2024-05-10 ENCOUNTER — Other Ambulatory Visit: Payer: Self-pay

## 2024-05-10 ENCOUNTER — Encounter: Payer: Self-pay | Admitting: Endocrinology

## 2024-05-10 DIAGNOSIS — E1165 Type 2 diabetes mellitus with hyperglycemia: Secondary | ICD-10-CM

## 2024-05-10 MED ORDER — FREESTYLE LIBRE 3 PLUS SENSOR MISC: 1.0000 | 3 refills | Status: AC

## 2024-05-25 ENCOUNTER — Other Ambulatory Visit

## 2024-05-26 LAB — BASIC METABOLIC PANEL WITHOUT GFR
BUN: 14 mg/dL (ref 7–25)
CO2: 24 mmol/L (ref 20–32)
Calcium: 9.3 mg/dL (ref 8.6–10.3)
Chloride: 106 mmol/L (ref 98–110)
Creat: 1.02 mg/dL (ref 0.70–1.35)
Glucose, Bld: 113 mg/dL — ABNORMAL HIGH (ref 65–99)
Potassium: 4.5 mmol/L (ref 3.5–5.3)
Sodium: 138 mmol/L (ref 135–146)

## 2024-05-26 LAB — LIPID PANEL
Cholesterol: 192 mg/dL (ref <200)
HDL: 36 mg/dL — ABNORMAL LOW (ref >=40)
LDL Cholesterol (Calc): 126 mg/dL — ABNORMAL HIGH
Non-HDL Cholesterol (Calc): 156 mg/dL — ABNORMAL HIGH (ref <130)
Total CHOL/HDL Ratio: 5.3 (calc) — ABNORMAL HIGH (ref <5.0)
Triglycerides: 189 mg/dL — ABNORMAL HIGH (ref <150)

## 2024-05-26 LAB — HEMOGLOBIN A1C
Hgb A1c MFr Bld: 7.3 % — ABNORMAL HIGH (ref <5.7)
Mean Plasma Glucose: 163 mg/dL
eAG (mmol/L): 9 mmol/L

## 2024-05-28 ENCOUNTER — Other Ambulatory Visit (HOSPITAL_COMMUNITY): Payer: Self-pay

## 2024-05-28 ENCOUNTER — Other Ambulatory Visit: Payer: Self-pay | Admitting: Endocrinology

## 2024-05-28 ENCOUNTER — Telehealth: Payer: Self-pay | Admitting: Pharmacy Technician

## 2024-05-28 ENCOUNTER — Ambulatory Visit: Payer: Self-pay | Admitting: Endocrinology

## 2024-05-28 DIAGNOSIS — E1165 Type 2 diabetes mellitus with hyperglycemia: Secondary | ICD-10-CM

## 2024-05-28 NOTE — Telephone Encounter (Signed)
 Pharmacy Patient Advocate Encounter   Received notification from CoverMyMeds that prior authorization for Ozempic  (2 MG/DOSE) 8MG /3ML pen-injectors  is required/requested.   Insurance verification completed.   The patient is insured through [facility] .   Per test claim: PA required; PA started via CoverMyMeds. KEY [id number] . Waiting for clinical questions to populate.

## 2024-05-28 NOTE — Telephone Encounter (Signed)
 Refill request complete

## 2024-05-29 ENCOUNTER — Other Ambulatory Visit: Payer: Self-pay | Admitting: Endocrinology

## 2024-05-29 ENCOUNTER — Ambulatory Visit: Admitting: Endocrinology

## 2024-05-29 ENCOUNTER — Encounter: Payer: Self-pay | Admitting: Endocrinology

## 2024-05-29 ENCOUNTER — Other Ambulatory Visit (HOSPITAL_COMMUNITY): Payer: Self-pay

## 2024-05-29 DIAGNOSIS — E1169 Type 2 diabetes mellitus with other specified complication: Secondary | ICD-10-CM | POA: Diagnosis not present

## 2024-05-29 DIAGNOSIS — Z794 Long term (current) use of insulin: Secondary | ICD-10-CM | POA: Diagnosis not present

## 2024-05-29 DIAGNOSIS — E1165 Type 2 diabetes mellitus with hyperglycemia: Secondary | ICD-10-CM | POA: Diagnosis not present

## 2024-05-29 MED ORDER — OZEMPIC (2 MG/DOSE) 8 MG/3ML ~~LOC~~ SOPN: PEN_INJECTOR | SUBCUTANEOUS | 3 refills | Status: AC

## 2024-05-29 MED ORDER — METFORMIN HCL ER 500 MG PO TB24: 1000.0000 mg | ORAL_TABLET | Freq: Two times a day (BID) | ORAL | 3 refills | Status: DC

## 2024-05-29 MED ORDER — PITAVASTATIN CALCIUM 4 MG PO TABS: 1.0000 | ORAL_TABLET | Freq: Every day | ORAL | 3 refills | Status: DC

## 2024-05-29 MED ORDER — METFORMIN HCL ER 500 MG PO TB24
1000.0000 mg | ORAL_TABLET | Freq: Two times a day (BID) | ORAL | 1 refills | Status: DC
Start: 2024-05-29 — End: 2024-05-29

## 2024-05-29 MED ORDER — PITAVASTATIN CALCIUM 4 MG PO TABS: 1.0000 | ORAL_TABLET | Freq: Every day | ORAL | 3 refills | Status: AC

## 2024-05-29 MED ORDER — TRESIBA FLEXTOUCH 100 UNIT/ML ~~LOC~~ SOPN
10.0000 [IU] | PEN_INJECTOR | Freq: Every day | SUBCUTANEOUS | 4 refills | Status: DC
Start: 2024-05-29 — End: 2024-05-29

## 2024-05-29 NOTE — Telephone Encounter (Signed)
 Pharmacy Patient Advocate Encounter  Received notification from [facility] that Prior Authorization for Ozempic  (2 MG/DOSE) 8MG /3ML pen-injectors  has been CANCELLED due to No PA needed. Refill too soon. Can be filled on 05/30/24.

## 2024-05-29 NOTE — Progress Notes (Signed)
 Outpatient Endocrinology Note Greg Elihu Milstein, MD   Patient's Name: Greg Kane    DOB: Oct 30, 1965    MRN: [id number]                                                    REASON OF VISIT: Follow up for type 2 diabetes mellitus  PCP: Greg Kane, Greg SAUNDERS, DO  HISTORY OF PRESENT ILLNESS:   Greg Kane is a 58 y.o. old male with past medical history listed below, is here for follow up for type 2 diabetes mellitus.   Pertinent Diabetes History: Patient was previously seen by Dr. Von and was last time seen in August 2024.  Patient was diagnosed with type 2 diabetes mellitus in 2008.  Chronic Diabetes Complications : Retinopathy: no. Last ophthalmology exam was done on 04/2023, following with ophthalmology regularly.  Nephropathy: no Peripheral neuropathy: yes,  He has a history of painful burning sensation in his feet, mainly at night  Symptoms are nearly resolved, still not needing gabapentin  , on gabapentin  as needed.  Coronary artery disease: no Stroke: no  Relevant comorbidities and cardiovascular risk factors: Obesity: yes Body mass index is 37.57 kg/m.  Hypertension: Yes  Hyperlipidemia : Yes, on statin.  Hyperlipidemia: He has mixed hyperlipidemia with increased LDL and persistently low HDL Previously on Lipitor which caused muscle aches. He had complained that 10 mg Crestor  every day was causing heaviness and swelling in his legs which improved with stopping the medication.   However he is tolerating Livalo  4 mg very well and LDL is improved.  Current / Home Diabetic regimen includes:  Tresiba  20 units in a.m. he has not been taking much lately.  Non-insulin  drugs: Ozempic  2 mg weekly, Jardiance  25 mg, metformin  XR 2000 mg.   Prior diabetic medications: Actos , Invokana , Victoza  in the past.  Glycemic data:    CONTINUOUS GLUCOSE MONITORING SYSTEM (CGMS) INTERPRETATION: At today's visit, we reviewed CGM downloads. The full report is scanned in the media.  Reviewing the CGM trends, blood glucose are as follows:  FreeStyle Libre 3 CGM-  Sensor Download (Sensor download was reviewed and summarized below.) Dates: August 13 to May 29, 2024, 14 days  Glucose Management Indicator: 6.7%      Interpretation: Mostly acceptable blood sugar.  Rarely blood sugar more than 200 related to high carb meals.  No hypoglycemia.  Hypoglycemia: Patient has no hypoglycemic episodes. Patient has hypoglycemia awareness.  Factors modifying glucose control: 1.  Diabetic diet assessment: 3 meals a day.  2.  Staying active or exercising: Going to gym, regular exercise.  3.  Medication compliance: compliant all of the time.  Interval history  Hemoglobin A1c 7.3%.  GMI on CGM 6.7%.  He has not been taking Tresiba  most of the time in the last few months.  He has been tolerating Ozempic  well.  He lost about 20 a pound of weight in the last 6 months.  Denies numbness and tingling of the feet.  He complains of mild erectile dysfunction however he would like to monitor over time for now, consider sildenafil, may discuss with primary care provider.  He reports normal libido.  No other complaints today.  REVIEW OF SYSTEMS As per history of present illness.   PAST MEDICAL HISTORY: Past Medical History:  Diagnosis Date   Allergy    Bowel  obstruction (HCC) 10/2011   Diabetes mellitus    Gout    Hx of adenomatous polyp of colon 01/22/2015   Hyperlipidemia    Hypertension    Umbilical hernia     PAST SURGICAL HISTORY: Past Surgical History:  Procedure Laterality Date   APPENDECTOMY     1980s   COLONOSCOPY W/ POLYPECTOMY  01/2015   LUMBAR SPINE SURGERY  2018   dr Joshua    ALLERGIES: Allergies  Allergen Reactions   Penicillins Anaphylaxis   Pneumococcal Vaccines Other (See Comments)    Cellulitis, swelling    FAMILY HISTORY:  Family History  Problem Relation Age of Onset   Diabetes Mother    Hyperlipidemia Father    Hypertension Father     Dementia Father    Colon cancer Neg Hx    Esophageal cancer Neg Hx    Stomach cancer Neg Hx    Rectal cancer Neg Hx     SOCIAL HISTORY: Social History   Socioeconomic History   Marital status: Married    Spouse name: Not on file   Number of children: Not on file   Years of education: Not on file   Highest education level: Not on file  Occupational History   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  Tobacco Use   Smoking status: Former    Current packs/day: 0.00    Average packs/day: 0.3 packs/day for 38.0 years (9.5 ttl pk-yrs)    Types: Cigarettes, Cigars    Start date: 04/29/1984    Quit date: 04/29/2022    Years since quitting: 2.0   Smokeless tobacco: Former   Tobacco comments:    pt doesn't buy cig-- borrows from others no more than 4 cig a week---quit 1 week ago (July 2023)  Substance and Sexual Activity   Alcohol use: Yes    Alcohol/week: 0.0 standard drinks of alcohol    Comment: occas   Drug use: No   Sexual activity: Yes  Other Topics Concern   Not on file  Social History Narrative   Married   Children 3   Manufacturing engineer   Exercise -a little      Social Drivers of Corporate investment banker Strain: Not on file  Food Insecurity: Not on file  Transportation Needs: Not on file  Physical Activity: Not on file  Stress: Not on file  Social Connections: Not on file    MEDICATIONS:  Current Outpatient Medications  Medication Sig Dispense Refill   allopurinol  (ZYLOPRIM ) 100 MG tablet Take 1 tablet (100 mg total) by mouth 2 (two) times daily as needed. For gout 180 tablet 1   Continuous Glucose Sensor (FREESTYLE LIBRE 3 PLUS SENSOR) MISC Inject 1 Device into the skin continuous. Change every 15 days 6 each 3   empagliflozin  (JARDIANCE ) 25 MG TABS tablet TAKE 1 TABLET(25 MG) BY MOUTH DAILY 90 tablet 3   gabapentin  (NEURONTIN ) 300 MG capsule Take 1 capsule 3 times a day as needed 270 capsule 1   Insulin  Pen Needle 32G X 4 MM MISC Use to inject insulin  once daily  90 each 1   insulin  degludec (TRESIBA  FLEXTOUCH) 100 UNIT/ML FlexTouch Pen Inject 10 Units into the skin daily. Inject 0.2ml (20units) subcutaneously daily 15 mL 4   metFORMIN  (GLUCOPHAGE -XR) 500 MG 24 hr tablet Take 2 tablets (1,000 mg total) by mouth 2 (two) times daily. 360 tablet 3   Pitavastatin  Calcium  4 MG TABS Take 1 tablet (4 mg total) by mouth daily. 90 tablet 3  Semaglutide , 2 MG/DOSE, (OZEMPIC , 2 MG/DOSE,) 8 MG/3ML SOPN DIAL AND INJECT UNDER THE SKIN 2 MG WEEKLY 9 mL 3   Current Facility-Administered Medications  Medication Dose Route Frequency Provider Last Rate Last Admin   0.9 %  sodium chloride  infusion  500 mL Intravenous Once Avram Lupita BRAVO, MD        PHYSICAL EXAM: Vitals:   05/29/24 1358  BP: 124/70  Pulse: 96  Resp: 20  SpO2: 99%  Weight: 265 lb 9.6 oz (120.5 kg)  Height: 5' 10.5 (1.791 m)    Body mass index is 37.57 kg/m.  Wt Readings from Last 3 Encounters:  05/29/24 265 lb 9.6 oz (120.5 kg)  01/25/24 273 lb 12.8 oz (124.2 kg)  09/26/23 275 lb 6.4 oz (124.9 kg)    General: Well developed, well nourished male in no apparent distress.  HEENT: AT/[place], no external lesions.  Eyes: Conjunctiva clear and no icterus. Neck: Neck supple  Lungs: Respirations not labored Neurologic: Alert, oriented, normal speech Extremities / Skin: Dry.   Psychiatric: Does not appear depressed or anxious  Diabetic Foot Exam - Simple   No data filed     LABS Reviewed Lab Results  Component Value Date   HGBA1C 7.3 (H) 05/25/2024   HGBA1C 7.2 (H) 01/19/2024   HGBA1C 7.3 (H) 09/19/2023   Lab Results  Component Value Date   FRUCTOSAMINE 242 07/07/2017   FRUCTOSAMINE 241 11/09/2016   FRUCTOSAMINE 253 10/02/2013   Lab Results  Component Value Date   CHOL 192 05/25/2024   HDL 36 (L) 05/25/2024   LDLCALC 126 (H) 05/25/2024   LDLDIRECT 92.0 06/10/2021   TRIG 189 (H) 05/25/2024   CHOLHDL 5.3 (H) 05/25/2024   Lab Results  Component Value Date   MICRALBCREAT 3  01/19/2024   MICRALBCREAT CANCELED 11/01/2014   Lab Results  Component Value Date   CREATININE 1.02 05/25/2024   Lab Results  Component Value Date   GFR 57.87 (L) 09/12/2023    ASSESSMENT / PLAN  1. Type 2 diabetes mellitus with morbid obesity (HCC)   2. Uncontrolled type 2 diabetes mellitus with hyperglycemia, with long-term current use of insulin  (HCC)     Diabetes Mellitus type 2, complicated by diabetic neuropathy. - Diabetic status / severity: Uncontrolled.  Lab Results  Component Value Date   HGBA1C 7.3 (H) 05/25/2024    - Hemoglobin A1c goal : <6.5%  Mostly acceptable blood sugar as per CGM data.  GMI 6.7%.  - Medications:   Decrease Tresiba  from 20 to 10 units daily.  Okay not to take Tresiba  if blood sugar remained acceptable.  Continue Ozempic  2 mg weekly, Jardiance  25 mg, metformin  XR 2000 mg.   - Home glucose testing: CGM and check as needed.  - Discussed/ Gave Hypoglycemia treatment plan.  # Consult : not required at this time.   # Annual urine for microalbuminuria/ creatinine ratio, no microalbuminuria currently. Last  Lab Results  Component Value Date   MICRALBCREAT 3 01/19/2024    # Foot check nightly / neuropathy, continue gabapentin  as needed.  # Annual dilated diabetic eye exams.   - Diet: Make healthy diabetic food choices - Life style / activity / exercise: Discussed.  2. Blood pressure  -  BP Readings from Last 1 Encounters:  05/29/24 124/70    - Control is in target.  - No change in current plans.  3. Lipid status / Hyperlipidemia - Last  Lab Results  Component Value Date   LDLCALC 126 (H) 05/25/2024   -  Continue pitavastatin  4 mg daily.  LDL is still high.  Discussed about diet control and more exercise.  Will continue current dose of pitavastatin .   Diagnoses and all orders for this visit:  Type 2 diabetes mellitus with morbid obesity (HCC) -     Discontinue: metFORMIN  (GLUCOPHAGE -XR) 500 MG 24 hr tablet; Take 2  tablets (1,000 mg total) by mouth 2 (two) times daily. -     insulin  degludec (TRESIBA  FLEXTOUCH) 100 UNIT/ML FlexTouch Pen; Inject 10 Units into the skin daily. Inject 0.2ml (20units) subcutaneously daily -     metFORMIN  (GLUCOPHAGE -XR) 500 MG 24 hr tablet; Take 2 tablets (1,000 mg total) by mouth 2 (two) times daily.  Uncontrolled type 2 diabetes mellitus with hyperglycemia, with long-term current use of insulin  (HCC) -     Discontinue: Pitavastatin  Calcium  4 MG TABS; Take 1 tablet (4 mg total) by mouth daily. -     Semaglutide , 2 MG/DOSE, (OZEMPIC , 2 MG/DOSE,) 8 MG/3ML SOPN; DIAL AND INJECT UNDER THE SKIN 2 MG WEEKLY -     Pitavastatin  Calcium  4 MG TABS; Take 1 tablet (4 mg total) by mouth daily.    DISPOSITION Follow up in clinic in 4 months suggested.  Labs on the same day of the visit.   All questions answered and patient verbalized understanding of the plan.  Greg Cornell Gaber, MD [facility] Endocrinology [facility] [address] [place], [address] [place], [place] [address] Phone # [phone]  At least part of this note was generated using voice recognition software. Inadvertent word errors may have occurred, which were not recognized during the proofreading process.

## 2024-05-31 ENCOUNTER — Encounter: Payer: Self-pay | Admitting: Endocrinology

## 2024-07-31 LAB — OPHTHALMOLOGY REPORT-SCANNED

## 2024-08-09 ENCOUNTER — Ambulatory Visit: Payer: Self-pay | Admitting: Endocrinology

## 2024-10-01 ENCOUNTER — Encounter: Payer: Self-pay | Admitting: Endocrinology

## 2024-10-01 ENCOUNTER — Ambulatory Visit: Admitting: Endocrinology

## 2024-10-01 ENCOUNTER — Ambulatory Visit: Payer: Self-pay | Admitting: Endocrinology

## 2024-10-01 VITALS — BP 112/70 | HR 94 | Resp 16 | Ht 70.5 in | Wt 262.8 lb

## 2024-10-01 DIAGNOSIS — Z7985 Long-term (current) use of injectable non-insulin antidiabetic drugs: Secondary | ICD-10-CM | POA: Diagnosis not present

## 2024-10-01 DIAGNOSIS — Z7984 Long term (current) use of oral hypoglycemic drugs: Secondary | ICD-10-CM

## 2024-10-01 DIAGNOSIS — E1165 Type 2 diabetes mellitus with hyperglycemia: Secondary | ICD-10-CM

## 2024-10-01 DIAGNOSIS — E782 Mixed hyperlipidemia: Secondary | ICD-10-CM

## 2024-10-01 DIAGNOSIS — Z794 Long term (current) use of insulin: Secondary | ICD-10-CM

## 2024-10-01 LAB — POCT GLYCOSYLATED HEMOGLOBIN (HGB A1C): Hemoglobin A1C: 7.3 % — AB (ref 4.0–5.6)

## 2024-10-01 MED ORDER — EMPAGLIFLOZIN 25 MG PO TABS: ORAL_TABLET | ORAL | 3 refills | Status: AC

## 2024-10-01 NOTE — Progress Notes (Unsigned)
 "  Outpatient Endocrinology Note Greg Luster, MD   Patient's Name: Greg Kane    DOB: April 29, 1966    MRN: [id number]                                                    REASON OF VISIT: Follow up for type 2 diabetes mellitus  PCP: Antonio Meth, Jamee SAUNDERS, DO  HISTORY OF PRESENT ILLNESS:   Greg Kane is a 58 y.o. old male with past medical history listed below, is here for follow up for type 2 diabetes mellitus.   Pertinent Diabetes History: Patient was previously seen by Dr. Von and was last time seen in August 2024.  Patient was diagnosed with type 2 diabetes mellitus in 2008.  Chronic Diabetes Complications : Retinopathy: no. Last ophthalmology exam was done on 04/2023, following with ophthalmology regularly.  Nephropathy: no Peripheral neuropathy: yes,  He has a history of painful burning sensation in his feet, mainly at night  Symptoms are nearly resolved, still not needing gabapentin  , on gabapentin  as needed.  Coronary artery disease: no Stroke: no  Relevant comorbidities and cardiovascular risk factors: Obesity: yes Body mass index is 37.17 kg/m.  Hypertension: Yes  Hyperlipidemia : Yes, on statin.  Hyperlipidemia: He has mixed hyperlipidemia with increased LDL and persistently low HDL Previously on Lipitor which caused muscle aches. He had complained that 10 mg Crestor  every day was causing heaviness and swelling in his legs which improved with stopping the medication.   However he is tolerating Livalo  4 mg very well and LDL is improved.  Current / Home Diabetic regimen includes:  Tresiba  20 units in a.m. he has not been taking much lately.  Non-insulin  drugs: Ozempic  2 mg weekly, Jardiance  25 mg, metformin  XR 2000 mg.   Prior diabetic medications: Actos , Invokana , Victoza  in the past.  Glycemic data:    CONTINUOUS GLUCOSE MONITORING SYSTEM (CGMS) INTERPRETATION: At today's visit, we reviewed CGM downloads. The full report is scanned in the media.  Reviewing the CGM trends, blood glucose are as follows:  FreeStyle Libre 3 CGM-  Sensor Download (Sensor download was reviewed and summarized below.) Dates: December 16 to December 29 , 2025, 14 days  Glucose Management Indicator: 6.6%     Interpretation: Mostly acceptable blood sugar, no concerning hyperglycemia.  No hypoglycemia.  Hypoglycemia: Patient has no hypoglycemic episodes. Patient has hypoglycemia awareness.  Factors modifying glucose control: 1.  Diabetic diet assessment: 3 meals a day.  2.  Staying active or exercising: Going to gym, regular exercise.  3.  Medication compliance: compliant all of the time.  Interval history  Hemoglobin A1c 7.3%.  GMI on CGM 6.7%.  He has not been taking Tresiba  most of the time in the last few months.  He has been tolerating Ozempic  well.  He lost about 20 a pound of weight in the last 6 months.  Denies numbness and tingling of the feet.  He complains of mild erectile dysfunction however he would like to monitor over time for now, consider sildenafil, may discuss with primary care provider.  He reports normal libido.  No other complaints today.  REVIEW OF SYSTEMS As per history of present illness.   PAST MEDICAL HISTORY: Past Medical History:  Diagnosis Date   Allergy    Bowel obstruction (HCC) 10/2011   Diabetes mellitus  Gout    Hx of adenomatous polyp of colon 01/22/2015   Hyperlipidemia    Hypertension    Umbilical hernia     PAST SURGICAL HISTORY: Past Surgical History:  Procedure Laterality Date   APPENDECTOMY     1980s   COLONOSCOPY W/ POLYPECTOMY  01/2015   LUMBAR SPINE SURGERY  2018   dr Joshua    ALLERGIES: Allergies  Allergen Reactions   Penicillins Anaphylaxis   Pneumococcal Vaccines Other (See Comments)    Cellulitis, swelling    FAMILY HISTORY:  Family History  Problem Relation Age of Onset   Diabetes Mother    Hyperlipidemia Father    Hypertension Father    Dementia Father    Colon cancer  Neg Hx    Esophageal cancer Neg Hx    Stomach cancer Neg Hx    Rectal cancer Neg Hx     SOCIAL HISTORY: Social History   Socioeconomic History   Marital status: Married    Spouse name: Not on file   Number of children: Not on file   Years of education: Not on file   Highest education level: Not on file  Occupational History   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  Tobacco Use   Smoking status: Former    Current packs/day: 0.00    Average packs/day: 0.3 packs/day for 38.0 years (9.5 ttl pk-yrs)    Types: Cigarettes, Cigars    Start date: 04/29/1984    Quit date: 04/29/2022    Years since quitting: 2.4   Smokeless tobacco: Former   Tobacco comments:    pt doesn't buy cig-- borrows from others no more than 4 cig a week---quit 1 week ago (July 2023)  Substance and Sexual Activity   Alcohol use: Yes    Alcohol/week: 0.0 standard drinks of alcohol    Comment: occas   Drug use: No   Sexual activity: Yes  Other Topics Concern   Not on file  Social History Narrative   Married   Children 3   Manufacturing engineer   Exercise -a little      Social Drivers of Health   Tobacco Use: Medium Risk (10/01/2024)   Patient History    Smoking Tobacco Use: Former    Smokeless Tobacco Use: Former    Passive Exposure: Not on Stage Manager: Not on [employer] Insecurity: Not on file  Transportation Needs: Not on file  Physical Activity: Not on file  Stress: Not on file  Social Connections: Not on file  Depression (PHQ2-9): Low Risk (09/12/2023)   Depression (PHQ2-9)    PHQ-2 Score: 0  Alcohol Screen: Not on file  Housing: Not on file  Utilities: Not on file  Health Literacy: Not on file    MEDICATIONS:  Current Outpatient Medications  Medication Sig Dispense Refill   allopurinol  (ZYLOPRIM ) 100 MG tablet Take 1 tablet (100 mg total) by mouth 2 (two) times daily as needed. For gout 180 tablet 1   Continuous Glucose Sensor (FREESTYLE LIBRE 3 PLUS SENSOR) MISC Inject 1  Device into the skin continuous. Change every 15 days 6 each 3   gabapentin  (NEURONTIN ) 300 MG capsule Take 1 capsule 3 times a day as needed 270 capsule 1   Insulin  Pen Needle 32G X 4 MM MISC Use to inject insulin  once daily 90 each 1   metFORMIN  (GLUCOPHAGE -XR) 500 MG 24 hr tablet Take 2 tablets (1,000 mg total) by mouth 2 (two) times daily. 360 tablet 3   Pitavastatin  Calcium   4 MG TABS Take 1 tablet (4 mg total) by mouth daily. 90 tablet 3   Semaglutide , 2 MG/DOSE, (OZEMPIC , 2 MG/DOSE,) 8 MG/3ML SOPN DIAL AND INJECT UNDER THE SKIN 2 MG WEEKLY 9 mL 3   empagliflozin  (JARDIANCE ) 25 MG TABS tablet TAKE 1 TABLET(25 MG) BY MOUTH DAILY 90 tablet 3   Current Facility-Administered Medications  Medication Dose Route Frequency Provider Last Rate Last Admin   0.9 %  sodium chloride  infusion  500 mL Intravenous Once Avram Lupita BRAVO, MD        PHYSICAL EXAM: Vitals:   10/01/24 1447  BP: 112/70  Pulse: 94  Resp: 16  SpO2: 97%  Weight: 262 lb 12.8 oz (119.2 kg)  Height: 5' 10.5 (1.791 m)     Body mass index is 37.17 kg/m.  Wt Readings from Last 3 Encounters:  10/01/24 262 lb 12.8 oz (119.2 kg)  05/29/24 265 lb 9.6 oz (120.5 kg)  01/25/24 273 lb 12.8 oz (124.2 kg)    General: Well developed, well nourished male in no apparent distress.  HEENT: AT/[place], no external lesions.  Eyes: Conjunctiva clear and no icterus. Neck: Neck supple  Lungs: Respirations not labored Neurologic: Alert, oriented, normal speech Extremities / Skin: Dry.   Psychiatric: Does not appear depressed or anxious  Diabetic Foot Exam - Simple   No data filed     LABS Reviewed Lab Results  Component Value Date   HGBA1C 7.3 (A) 10/01/2024   HGBA1C 7.3 (H) 05/25/2024   HGBA1C 7.2 (H) 01/19/2024   Lab Results  Component Value Date   FRUCTOSAMINE 242 07/07/2017   FRUCTOSAMINE 241 11/09/2016   FRUCTOSAMINE 253 10/02/2013   Lab Results  Component Value Date   CHOL 192 05/25/2024   HDL 36 (L) 05/25/2024    LDLCALC 126 (H) 05/25/2024   LDLDIRECT 92.0 06/10/2021   TRIG 189 (H) 05/25/2024   CHOLHDL 5.3 (H) 05/25/2024   Lab Results  Component Value Date   MICRALBCREAT 3 01/19/2024   MICRALBCREAT CANCELED 11/01/2014   Lab Results  Component Value Date   CREATININE 1.02 05/25/2024   Lab Results  Component Value Date   GFR 57.87 (L) 09/12/2023    ASSESSMENT / PLAN  1. Uncontrolled type 2 diabetes mellitus with hyperglycemia, with long-term current use of insulin  (HCC)      Diabetes Mellitus type 2, complicated by diabetic neuropathy. - Diabetic status / severity: Uncontrolled.  Lab Results  Component Value Date   HGBA1C 7.3 (A) 10/01/2024    - Hemoglobin A1c goal : <6.5%  Mostly acceptable blood sugar as per CGM data.  GMI 6.7%.  - Medications:   Decrease Tresiba  from 20 to 10 units daily.  Okay not to take Tresiba  if blood sugar remained acceptable.  Continue Ozempic  2 mg weekly, Jardiance  25 mg, metformin  XR 2000 mg.   - Home glucose testing: CGM and check as needed.  - Discussed/ Gave Hypoglycemia treatment plan.  # Consult : not required at this time.   # Annual urine for microalbuminuria/ creatinine ratio, no microalbuminuria currently. Last  Lab Results  Component Value Date   MICRALBCREAT 3 01/19/2024    # Foot check nightly / neuropathy, continue gabapentin  as needed.  # Annual dilated diabetic eye exams.   - Diet: Make healthy diabetic food choices - Life style / activity / exercise: Discussed.  2. Blood pressure  -  BP Readings from Last 1 Encounters:  10/01/24 112/70    - Control is in target.  -  No change in current plans.  3. Lipid status / Hyperlipidemia - Last  Lab Results  Component Value Date   LDLCALC 126 (H) 05/25/2024   - Continue pitavastatin  4 mg daily.  LDL is still high.  Discussed about diet control and more exercise.  Will continue current dose of pitavastatin .   Diagnoses and all orders for this visit:  Uncontrolled  type 2 diabetes mellitus with hyperglycemia, with long-term current use of insulin  (HCC) -     POCT glycosylated hemoglobin (Hb A1C) -     empagliflozin  (JARDIANCE ) 25 MG TABS tablet; TAKE 1 TABLET(25 MG) BY MOUTH DAILY     DISPOSITION Follow up in clinic in 4 months suggested.  Labs on the same day of the visit.   All questions answered and patient verbalized understanding of the plan.  Courtnei Ruddell, MD [facility] [address] [place], [address] [place], [place] [address] Phone # [phone]  At least part of this note was generated using voice recognition software. Inadvertent word errors may have occurred, which were not recognized during the proofreading process. "

## 2024-11-07 ENCOUNTER — Other Ambulatory Visit: Payer: Self-pay | Admitting: Endocrinology

## 2025-01-25 ENCOUNTER — Other Ambulatory Visit

## 2025-01-29 ENCOUNTER — Ambulatory Visit: Admitting: Endocrinology
